# Patient Record
Sex: Female | Born: 1965 | Race: White | Hispanic: No | Marital: Married | Smoking: Former smoker
Health system: Southern US, Community
[De-identification: ages and names within clinical notes are randomized; demographics above are authoritative.]

## PROBLEM LIST (undated history)

## (undated) DIAGNOSIS — C8590 Non-Hodgkin lymphoma, unspecified, unspecified site: Secondary | ICD-10-CM

## (undated) DIAGNOSIS — Z8572 Personal history of non-Hodgkin lymphomas: Secondary | ICD-10-CM

## (undated) DIAGNOSIS — G4733 Obstructive sleep apnea (adult) (pediatric): Principal | ICD-10-CM

## (undated) DIAGNOSIS — J986 Disorders of diaphragm: Secondary | ICD-10-CM

## (undated) DIAGNOSIS — R0600 Dyspnea, unspecified: Secondary | ICD-10-CM

## (undated) DIAGNOSIS — I509 Heart failure, unspecified: Secondary | ICD-10-CM

## (undated) DIAGNOSIS — G4730 Sleep apnea, unspecified: Secondary | ICD-10-CM

## (undated) DIAGNOSIS — Z9481 Bone marrow transplant status: Secondary | ICD-10-CM

## (undated) DIAGNOSIS — E079 Disorder of thyroid, unspecified: Secondary | ICD-10-CM

## (undated) DIAGNOSIS — R011 Cardiac murmur, unspecified: Secondary | ICD-10-CM

## (undated) DIAGNOSIS — J45909 Unspecified asthma, uncomplicated: Secondary | ICD-10-CM

## (undated) DIAGNOSIS — E039 Hypothyroidism, unspecified: Secondary | ICD-10-CM

## (undated) HISTORY — DX: Personal history of non-Hodgkin lymphomas: Z85.72

## (undated) HISTORY — DX: Obstructive sleep apnea (adult) (pediatric): G47.33

## (undated) HISTORY — DX: Non-Hodgkin lymphoma, unspecified, unspecified site: C85.90

## (undated) HISTORY — DX: Heart failure, unspecified: I50.9

## (undated) HISTORY — DX: Cardiac murmur, unspecified: R01.1

## (undated) HISTORY — DX: Disorder of thyroid, unspecified: E07.9

## (undated) HISTORY — PX: TUBAL LIGATION: SHX77

## (undated) HISTORY — PX: LYMPH NODE BIOPSY: SHX201

## (undated) HISTORY — PX: BONE MARROW TRANSPLANT: SHX200

## (undated) HISTORY — DX: Sleep apnea, unspecified: G47.30

## (undated) HISTORY — DX: Disorders of diaphragm: J98.6

---

## 1998-05-27 DIAGNOSIS — C8590 Non-Hodgkin lymphoma, unspecified, unspecified site: Secondary | ICD-10-CM

## 1998-05-27 HISTORY — DX: Non-Hodgkin lymphoma, unspecified, unspecified site: C85.90

## 1999-05-28 DIAGNOSIS — Z9481 Bone marrow transplant status: Secondary | ICD-10-CM

## 1999-05-28 HISTORY — DX: Bone marrow transplant status: Z94.81

## 2012-05-27 HISTORY — PX: HYSTEROTOMY: SHX1776

## 2012-10-14 ENCOUNTER — Ambulatory Visit (INDEPENDENT_AMBULATORY_CARE_PROVIDER_SITE_OTHER): Payer: BC Managed Care – PPO | Admitting: Family Medicine

## 2012-10-14 ENCOUNTER — Encounter: Payer: Self-pay | Admitting: Family Medicine

## 2012-10-14 VITALS — BP 105/70 | HR 80 | Ht 65.0 in | Wt 200.0 lb

## 2012-10-14 DIAGNOSIS — L290 Pruritus ani: Secondary | ICD-10-CM

## 2012-10-14 DIAGNOSIS — E039 Hypothyroidism, unspecified: Secondary | ICD-10-CM

## 2012-10-14 DIAGNOSIS — N898 Other specified noninflammatory disorders of vagina: Secondary | ICD-10-CM

## 2012-10-14 MED ORDER — MEBENDAZOLE 100 MG PO CHEW: 100.0000 mg | CHEWABLE_TABLET | Freq: Once | ORAL | Status: AC

## 2012-10-14 MED ORDER — PRAMOXINE-HC 1-1 % EX CREA: TOPICAL_CREAM | Freq: Three times a day (TID) | CUTANEOUS | Status: DC

## 2012-10-14 MED ORDER — SYNTHROID 137 MCG PO TABS: 137.0000 ug | ORAL_TABLET | Freq: Every day | ORAL | Status: DC

## 2012-10-14 NOTE — Patient Instructions (Addendum)
1)  Anal Itching - Take the Vermox twice for treatment; Analpram for itcing; Flander's Buttock Ointment   Anal Itching Itching around the anus is a common problem. It is usually not dangerous. It often is caused by skin irritation from stool, moisture, soaps, or clothing. Other causes are pinworms, especially if the itching is worse at night. In adults, the itching may be due to hemorrhoids. In some cases, the cause is unknown. Itching usually can be controlled by keeping the anal area clean and dry. CAUSES   Loose or sticky stool from diarrhea or rectal leakage.   Hemorrhoids. They allow stool to stick to the rectal area.   Certain foods. Be sure to discuss your diet with your caregiver.   Dry skin or skin diseases.   Infections such as a local yeast infection or certain sexually transmitted diseases (STDs).   Worms (parasites).   Diseases of the anus. These include abscesses, fissures, fistulas or cancer.   Sometimes a cause cannot be found.  DIAGNOSIS   Your caregiver will take your history and examine you. A careful exam of the anus is important. Your caregiver will inspect the outer area of your anus and will do a rectal exam.   Sometimes your caregiver will need to look inside the anus. This is a simple procedure that may be a little uncomfortable but usually does not require anesthesia.   If abnormalities are found, a biopsy might be done or you may be referred to a specialist.  TREATMENT  The treatment of your condition will depend on the cause.   Your caregiver will advise you on treatment of any disease found.   If you have rectal leakage or loose stools, a diet high in fiber or a fiber supplement should improve your condition.   Avoid foods or substances that might be causing your itching.   Gentle care of your anal area is important to avoid worsening the irritation.  HOME CARE INSTRUCTIONS  Do not rub or scratch the area. This makes the itching worse. It could  worsen conditions such as parasite infections.   After every bowel movement and at bedtime, gently clean the anal area. Bathe or use moistened tissue or soft wash cloth. You also may use pre-moistened anal cleansing pads or tissues made for cleaning up babies. Do not use soap. Gently pat the area dry.   Wear underwear made of cotton or with a cotton crotch. Do not wear tight fitting clothes or underwear that keep moisture in.   Avoid foods and beverages that may cause anal itching. Examples are beer, tea, coffee, milk, cola, tomatoes, citrus fruits, nuts, chocolate, and spicy foods.   Be sure you have enough fiber in your diet.   Do not use products that may irritate the anal skin. These include perfumed or colored toilet paper, deodorant sprays, and perfumed soaps.   Do not use any medication on the anal area unless advised. Some products may make itching worse.   It may take a few weeks for things to fully improve.  SEEK MEDICAL CARE IF:   The itching is not better in 3 to 4 days or is getting worse.   The skin around the anus becomes red or tender. This may be a sign of infection.   You have pain in the anus, especially with bowel movement.  SEEK IMMEDIATE MEDICAL CARE IF:  You have increasing pain in the anus or in the abdomen.   You have blood coming from the  anus.   You have pus or other discharge from the anus.   You develop a fever.  Document Released: 05/10/2000 Document Revised: 05/02/2011 Document Reviewed: 05/17/2008 [facility] Patient Information 2012 [place], [place].

## 2012-10-14 NOTE — Progress Notes (Deleted)
  Subjective:    Patient ID: Ashley Mckee, female    DOB: [birth year], 47 y.o.   MRN: [id number]  HPI    Review of Systems     Objective:   Physical Exam        Assessment & Plan:

## 2012-10-14 NOTE — Progress Notes (Signed)
  Subjective:    Patient ID: Ashley Mckee, female    DOB: 17-Feb-1966, 47 y.o.   MRN: [id number]  HPI  Ashley Mckee is here today to establish care with our practice.  She previously received her medical care at [facility].  She would like to discuss a few issues:     1)  Anal Itching:  She has been having anal itching for about 6 months. She has been trying to keep this area cleaned and has used OTC creams which have not helped her itching.     2)  Vaginal Discharge:  She has recently had some brownish discharge. She has not had a period for several years.    3)  Hypothyroidism:  She needs to have her levothyroxine refilled.    Review of Systems  Constitutional: Negative for fatigue and unexpected weight change.  Gastrointestinal: Negative.   Genitourinary: Positive for vaginal discharge.  Skin:       Anal itching    Past Medical History  Diagnosis Date  . Thyroid disease   . Heart murmur   . CHF (congestive heart failure)     Last EF was >50%   . 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    . Cancer 2000    Non Hodgkin's Lymphoma (Bone Marrow Transplant)  - [facility]   Family History  Problem Relation Age of Onset  . Hypertension Mother   . Diabetes Father   . Hypertension Father   . Cancer Maternal Grandmother     Stomach ?      History   Social History Narrative   Marital Status:  Married Engineer, technical sales)   Children:  Daughter (3)    Pets: Dog (2) Cat (1)    Living Situation: Lives with husband and kids    Occupation: Youth worker)     Education: Geneticist, molecular    Tobacco Use/Exposure:  Former Smoker    Alcohol Use:  Rarely    Drug Use:  None   Diet:  Regular   Exercise:  None   Hobbies:  Walking                Objective:   Physical Exam  Constitutional: She appears well-nourished. No distress.  HENT:  Head: Normocephalic.  Eyes: No scleral icterus.  Neck: No thyromegaly  present.  Cardiovascular: Normal rate, regular rhythm and normal heart sounds.   Pulmonary/Chest: Effort normal and breath sounds normal.  Abdominal: There is no tenderness.  Genitourinary: No vaginal discharge found.  Musculoskeletal: She exhibits no edema and no tenderness.  Neurological: She is alert.  Skin: Skin is warm and dry. No rash noted. No erythema.  Anus appears normal   Psychiatric: She has a normal mood and affect. Her behavior is normal. Judgment and thought content normal.      Assessment & Plan:    TIME 30 MINUTES:  MORE THAN 50% OF TIME WAS INVOLVED IN COUNSELING

## 2012-10-28 ENCOUNTER — Encounter: Payer: Self-pay | Admitting: Family Medicine

## 2012-10-28 DIAGNOSIS — E039 Hypothyroidism, unspecified: Secondary | ICD-10-CM | POA: Insufficient documentation

## 2012-10-28 DIAGNOSIS — L290 Pruritus ani: Secondary | ICD-10-CM | POA: Insufficient documentation

## 2012-10-28 DIAGNOSIS — N898 Other specified noninflammatory disorders of vagina: Secondary | ICD-10-CM | POA: Insufficient documentation

## 2012-10-28 NOTE — Assessment & Plan Note (Signed)
Her anus appears to be normal.  We discussed several reasons why she might have itching. She was given several medications to help with her itching.

## 2012-10-28 NOTE — Assessment & Plan Note (Addendum)
Refilled her Synthroid 137 mcg.

## 2012-10-28 NOTE — Assessment & Plan Note (Signed)
Her vaginal exam was normal - no discharge was noted.

## 2012-11-17 ENCOUNTER — Ambulatory Visit (INDEPENDENT_AMBULATORY_CARE_PROVIDER_SITE_OTHER): Payer: BC Managed Care – PPO | Admitting: Family Medicine

## 2012-11-17 ENCOUNTER — Encounter: Payer: Self-pay | Admitting: Family Medicine

## 2012-11-17 VITALS — BP 119/77 | HR 77 | Temp 97.3°F | Resp 20

## 2012-11-17 DIAGNOSIS — R319 Hematuria, unspecified: Secondary | ICD-10-CM

## 2012-11-17 DIAGNOSIS — R399 Unspecified symptoms and signs involving the genitourinary system: Secondary | ICD-10-CM

## 2012-11-17 DIAGNOSIS — R3989 Other symptoms and signs involving the genitourinary system: Secondary | ICD-10-CM

## 2012-11-17 LAB — POCT URINALYSIS DIPSTICK
Bilirubin, UA: NEGATIVE
Glucose, UA: NEGATIVE
Ketones, UA: NEGATIVE
Nitrite, UA: NEGATIVE
Protein, UA: NEGATIVE
Spec Grav, UA: 1.02
Urobilinogen, UA: NEGATIVE
pH, UA: 7.5

## 2012-11-17 MED ORDER — PHENAZOPYRIDINE HCL 200 MG PO TABS: ORAL_TABLET | ORAL | Status: DC

## 2012-11-17 MED ORDER — SULFAMETHOXAZOLE-TRIMETHOPRIM 800-160 MG PO TABS: ORAL_TABLET | ORAL | Status: AC

## 2012-11-17 MED ORDER — TERCONAZOLE 0.8 % VA CREA: TOPICAL_CREAM | VAGINAL | Status: DC

## 2012-11-17 MED ORDER — FLUCONAZOLE 150 MG PO TABS: 150.0000 mg | ORAL_TABLET | Freq: Once | ORAL | Status: AC

## 2012-11-17 NOTE — Progress Notes (Signed)
  Subjective:    Patient ID: Ashley Mckee, female    DOB: 09-27-1965, 47 y.o.   MRN: [id number]  HPI  Ms Bosket is here today as an acute patient. She was in her normal state of good health until Sunday morning early when she began having painful urination with a lot of blood noted in her urine.  She denies any fever, flank pain or abdominal pain.  Her rectal itching is better from her last visit.  She never did get the prescription filled for pinworm.    Review of Systems  Genitourinary: Positive for dysuria, urgency, frequency and hematuria.   Past Medical History  Diagnosis Date  . Thyroid disease   . Heart murmur   . CHF (congestive heart failure)     Last EF was >50%   . 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    . Cancer 2000    Non Hodgkin's Lymphoma (Bone Marrow Transplant)  - [facility]   Family History  Problem Relation Age of Onset  . Hypertension Mother   . Diabetes Father   . Hypertension Father   . Cancer Maternal Grandmother     Stomach ?    History   Social History Narrative   Marital Status:  Married Engineer, technical sales)   Children:  Daughter (3)    Pets: Dog (2) Cat (1)    Living Situation: Lives with husband and kids    Occupation: Youth worker)     Education: Geneticist, molecular    Tobacco Use/Exposure:  Former Smoker    Alcohol Use:  Rarely    Drug Use:  None   Diet:  Regular   Exercise:  None   Hobbies:  Walking                Objective:   Physical Exam  Constitutional: She appears well-nourished. No distress.  Cardiovascular: Normal rate, regular rhythm and normal heart sounds.   Pulmonary/Chest: Effort normal and breath sounds normal.  Abdominal: She exhibits no mass. There is tenderness. There is no rebound, no guarding and no CVA tenderness.  Neurological: She is alert.  Skin: Skin is warm and dry. No rash noted.  Psychiatric: She has a normal mood and affect.        Assessment & Plan:

## 2012-11-17 NOTE — Patient Instructions (Addendum)
1)  UTI - Take the Septra for 7 days and the Pyridium for 3 days.  If you develop a yeast infection get the other prescriptions filled.  We are sending off a culture and will let you know the results.  If you don't grow anything then we'll have you bring in a first morning urine a week after you complete the antibiotic.     Hematuria, Adult Hematuria (blood in your urine) can be caused by a bladder infection (cystitis), kidney infection (pyelonephritis), prostate infection (prostatitis), or kidney stone. Infections will usually respond to antibiotics (medications which kill germs), and a kidney stone will usually pass through your urine without further treatment. If you were put on antibiotics, take all the medicine until gone. You may feel better in a few days, but take all of your medicine or the infection may not respond and become more difficult to treat. If antibiotics were not given, an infection did not cause the blood in the urine. A further work up to find out the reason may be needed. HOME CARE INSTRUCTIONS   Drink lots of fluid, 3 to 4 quarts a day. If you have been diagnosed with an infection, cranberry juice is especially recommended, in addition to large amounts of water.  Avoid caffeine, tea, and carbonated beverages, because they tend to irritate the bladder.  Avoid alcohol as it may irritate the prostate.  Only take over-the-counter or prescription medicines for pain, discomfort, or fever as directed by your caregiver.  If you have been diagnosed with a kidney stone follow your caregivers instructions regarding straining your urine to catch the stone. TO PREVENT FURTHER INFECTIONS:  Empty the bladder often. Avoid holding urine for long periods of time.  After a bowel movement, women should cleanse front to back. Use each tissue only once.  Empty the bladder before and after sexual intercourse if you are a female.  Return to your caregiver if you develop back pain, fever,  nausea (feeling sick to your stomach), vomiting, or your symptoms (problems) are not better in 3 days. Return sooner if you are getting worse. If you have been requested to return for further testing make sure to keep your appointments. If an infection is not the cause of blood in your urine, X-rays may be required. Your caregiver will discuss this with you. SEEK IMMEDIATE MEDICAL CARE IF:   You have a persistent fever over 102 F (38.9 C).  You develop severe vomiting and are unable to keep the medication down.  You develop severe back or abdominal pain despite taking your medications.  You begin passing a large amount of blood or clots in your urine.  You feel extremely weak or faint, or pass out. MAKE SURE YOU:   Understand these instructions.  Will watch your condition.  Will get help right away if you are not doing well or get worse. Document Released: 05/13/2005 Document Revised: 08/05/2011 Document Reviewed: 12/31/2007 [facility] Patient Information 2014 [place], [place].   Urinary Tract Infection Urinary tract infections (UTIs) can develop anywhere along your urinary tract. Your urinary tract is your body's drainage system for removing wastes and extra water. Your urinary tract includes two kidneys, two ureters, a bladder, and a urethra. Your kidneys are a pair of bean-shaped organs. Each kidney is about the size of your fist. They are located below your ribs, one on each side of your spine. CAUSES Infections are caused by microbes, which are microscopic organisms, including fungi, viruses, and bacteria. These organisms are  so small that they can only be seen through a microscope. Bacteria are the microbes that most commonly cause UTIs. SYMPTOMS  Symptoms of UTIs may vary by age and gender of the patient and by the location of the infection. Symptoms in young women typically include a frequent and intense urge to urinate and a painful, burning feeling in the bladder or urethra  during urination. Older women and men are more likely to be tired, shaky, and weak and have muscle aches and abdominal pain. A fever may mean the infection is in your kidneys. Other symptoms of a kidney infection include pain in your back or sides below the ribs, nausea, and vomiting. DIAGNOSIS To diagnose a UTI, your caregiver will ask you about your symptoms. Your caregiver also will ask to provide a urine sample. The urine sample will be tested for bacteria and white blood cells. White blood cells are made by your body to help fight infection. TREATMENT  Typically, UTIs can be treated with medication. Because most UTIs are caused by a bacterial infection, they usually can be treated with the use of antibiotics. The choice of antibiotic and length of treatment depend on your symptoms and the type of bacteria causing your infection. HOME CARE INSTRUCTIONS  If you were prescribed antibiotics, take them exactly as your caregiver instructs you. Finish the medication even if you feel better after you have only taken some of the medication.  Drink enough water and fluids to keep your urine clear or pale yellow.  Avoid caffeine, tea, and carbonated beverages. They tend to irritate your bladder.  Empty your bladder often. Avoid holding urine for long periods of time.  Empty your bladder before and after sexual intercourse.  After a bowel movement, women should cleanse from front to back. Use each tissue only once. SEEK MEDICAL CARE IF:   You have back pain.  You develop a fever.  Your symptoms do not begin to resolve within 3 days. SEEK IMMEDIATE MEDICAL CARE IF:   You have severe back pain or lower abdominal pain.  You develop chills.  You have nausea or vomiting.  You have continued burning or discomfort with urination. MAKE SURE YOU:   Understand these instructions.  Will watch your condition.  Will get help right away if you are not doing well or get worse. Document Released:  02/20/2005 Document Revised: 11/12/2011 Document Reviewed: 06/21/2011 [facility] Patient Information 2014 [place], [place].

## 2012-11-29 DIAGNOSIS — R399 Unspecified symptoms and signs involving the genitourinary system: Secondary | ICD-10-CM | POA: Insufficient documentation

## 2012-11-29 DIAGNOSIS — R319 Hematuria, unspecified: Secondary | ICD-10-CM | POA: Insufficient documentation

## 2012-11-29 NOTE — Assessment & Plan Note (Addendum)
Her urine appears to be infected so we';; treat her with Septra and Pyridium.   We'll send off a culture to confirm an infection and what is her best treatment.

## 2012-11-29 NOTE — Assessment & Plan Note (Signed)
If she does not grow anything in her urine, she is to bring in a first morning urine so we can check for blood.

## 2012-11-30 LAB — URINE CULTURE
Colony Count: NO GROWTH
Organism ID, Bacteria: NO GROWTH

## 2012-12-07 ENCOUNTER — Other Ambulatory Visit (INDEPENDENT_AMBULATORY_CARE_PROVIDER_SITE_OTHER): Payer: BC Managed Care – PPO | Admitting: *Deleted

## 2012-12-07 DIAGNOSIS — R319 Hematuria, unspecified: Secondary | ICD-10-CM

## 2012-12-07 LAB — POCT URINALYSIS DIPSTICK
Bilirubin, UA: NEGATIVE
Blood, UA: NEGATIVE
Glucose, UA: NEGATIVE
Ketones, UA: NEGATIVE
Leukocytes, UA: NEGATIVE
Nitrite, UA: NEGATIVE
Protein, UA: NEGATIVE
Spec Grav, UA: 1.01
Urobilinogen, UA: NEGATIVE
pH, UA: 5

## 2012-12-08 ENCOUNTER — Other Ambulatory Visit: Payer: BC Managed Care – PPO

## 2012-12-29 ENCOUNTER — Telehealth: Payer: Self-pay | Admitting: *Deleted

## 2012-12-31 ENCOUNTER — Other Ambulatory Visit: Payer: Self-pay | Admitting: Family Medicine

## 2012-12-31 DIAGNOSIS — I10 Essential (primary) hypertension: Secondary | ICD-10-CM

## 2012-12-31 DIAGNOSIS — Z5181 Encounter for therapeutic drug level monitoring: Secondary | ICD-10-CM

## 2012-12-31 DIAGNOSIS — Z13220 Encounter for screening for lipoid disorders: Secondary | ICD-10-CM

## 2012-12-31 DIAGNOSIS — E039 Hypothyroidism, unspecified: Secondary | ICD-10-CM

## 2012-12-31 MED ORDER — LISINOPRIL 10 MG PO TABS: 10.0000 mg | ORAL_TABLET | Freq: Every day | ORAL | Status: DC

## 2012-12-31 NOTE — Telephone Encounter (Signed)
error 

## 2015-01-18 ENCOUNTER — Telehealth: Payer: Self-pay | Admitting: Behavioral Health

## 2015-01-18 NOTE — Telephone Encounter (Signed)
Unable to reach patient at time of Pre-Visit Call.  Phone line was busy and could not leave a message.

## 2015-01-19 ENCOUNTER — Ambulatory Visit (INDEPENDENT_AMBULATORY_CARE_PROVIDER_SITE_OTHER): Payer: 59 | Admitting: Family Medicine

## 2015-01-19 ENCOUNTER — Encounter: Payer: Self-pay | Admitting: Family Medicine

## 2015-01-19 ENCOUNTER — Other Ambulatory Visit (HOSPITAL_COMMUNITY)
Admission: RE | Admit: 2015-01-19 | Discharge: 2015-01-19 | Disposition: A | Discharge status: Home / Self Care | Payer: 59 | Source: Ambulatory Visit | Attending: Family Medicine | Admitting: Family Medicine

## 2015-01-19 VITALS — BP 116/84 | HR 88 | Temp 98.3°F | Ht 65.5 in | Wt 225.2 lb

## 2015-01-19 DIAGNOSIS — N760 Acute vaginitis: Secondary | ICD-10-CM | POA: Diagnosis present

## 2015-01-19 DIAGNOSIS — Z0000 Encounter for general adult medical examination without abnormal findings: Secondary | ICD-10-CM

## 2015-01-19 DIAGNOSIS — K921 Melena: Secondary | ICD-10-CM

## 2015-01-19 DIAGNOSIS — Z113 Encounter for screening for infections with a predominantly sexual mode of transmission: Secondary | ICD-10-CM | POA: Diagnosis present

## 2015-01-19 DIAGNOSIS — Z01411 Encounter for gynecological examination (general) (routine) with abnormal findings: Secondary | ICD-10-CM | POA: Insufficient documentation

## 2015-01-19 DIAGNOSIS — K649 Unspecified hemorrhoids: Secondary | ICD-10-CM

## 2015-01-19 DIAGNOSIS — K625 Hemorrhage of anus and rectum: Secondary | ICD-10-CM | POA: Insufficient documentation

## 2015-01-19 DIAGNOSIS — Z124 Encounter for screening for malignant neoplasm of cervix: Secondary | ICD-10-CM

## 2015-01-19 DIAGNOSIS — L900 Lichen sclerosus et atrophicus: Secondary | ICD-10-CM | POA: Diagnosis not present

## 2015-01-19 DIAGNOSIS — I420 Dilated cardiomyopathy: Secondary | ICD-10-CM | POA: Insufficient documentation

## 2015-01-19 DIAGNOSIS — J986 Disorders of diaphragm: Secondary | ICD-10-CM | POA: Insufficient documentation

## 2015-01-19 DIAGNOSIS — O036 Delayed or excessive hemorrhage following complete or unspecified spontaneous abortion: Secondary | ICD-10-CM

## 2015-01-19 DIAGNOSIS — E039 Hypothyroidism, unspecified: Secondary | ICD-10-CM

## 2015-01-19 MED ORDER — PRAMOXINE HCL 1 % RE FOAM: 1.0000 "application " | Freq: Three times a day (TID) | RECTAL | Status: DC | PRN

## 2015-01-19 NOTE — Progress Notes (Signed)
Pre visit review using our clinic review tool, if applicable. No additional management support is needed unless otherwise documented below in the visit note. 

## 2015-01-19 NOTE — Patient Instructions (Signed)
Preventive Care for Adults A healthy lifestyle and preventive care can promote health and wellness. Preventive health guidelines for women include the following key practices.  A routine yearly physical is a good way to check with your health care provider about your health and preventive screening. It is a chance to share any concerns and updates on your health and to receive a thorough exam.  Visit your dentist for a routine exam and preventive care every 6 months. Brush your teeth twice a day and floss once a day. Good oral hygiene prevents tooth decay and gum disease.  The frequency of eye exams is based on your age, health, family medical history, use of contact lenses, and other factors. Follow your health care provider's recommendations for frequency of eye exams.  Eat a healthy diet. Foods like vegetables, fruits, whole grains, low-fat dairy products, and lean protein foods contain the nutrients you need without too many calories. Decrease your intake of foods high in solid fats, added sugars, and salt. Eat the right amount of calories for you.Get information about a proper diet from your health care provider, if necessary.  Regular physical exercise is one of the most important things you can do for your health. Most adults should get at least 150 minutes of moderate-intensity exercise (any activity that increases your heart rate and causes you to sweat) each week. In addition, most adults need muscle-strengthening exercises on 2 or more days a week.  Maintain a healthy weight. The body mass index (BMI) is a screening tool to identify possible weight problems. It provides an estimate of body fat based on height and weight. Your health care provider can find your BMI and can help you achieve or maintain a healthy weight.For adults 20 years and older:  A BMI below 18.5 is considered underweight.  A BMI of 18.5 to 24.9 is normal.  A BMI of 25 to 29.9 is considered overweight.  A BMI of  30 and above is considered obese.  Maintain normal blood lipids and cholesterol levels by exercising and minimizing your intake of saturated fat. Eat a balanced diet with plenty of fruit and vegetables. Blood tests for lipids and cholesterol should begin at age 76 and be repeated every 5 years. If your lipid or cholesterol levels are high, you are over 50, or you are at high risk for heart disease, you may need your cholesterol levels checked more frequently.Ongoing high lipid and cholesterol levels should be treated with medicines if diet and exercise are not working.  If you smoke, find out from your health care provider how to quit. If you do not use tobacco, do not start.  Lung cancer screening is recommended for adults aged 22-80 years who are at high risk for developing lung cancer because of a history of smoking. A yearly low-dose CT scan of the lungs is recommended for people who have at least a 30-pack-year history of smoking and are a current smoker or have quit within the past 15 years. A pack year of smoking is smoking an average of 1 pack of cigarettes a day for 1 year (for example: 1 pack a day for 30 years or 2 packs a day for 15 years). Yearly screening should continue until the smoker has stopped smoking for at least 15 years. Yearly screening should be stopped for people who develop a health problem that would prevent them from having lung cancer treatment.  If you are pregnant, do not drink alcohol. If you are breastfeeding,  be very cautious about drinking alcohol. If you are not pregnant and choose to drink alcohol, do not have more than 1 drink per day. One drink is considered to be 12 ounces (355 mL) of beer, 5 ounces (148 mL) of wine, or 1.5 ounces (44 mL) of liquor.  Avoid use of street drugs. Do not share needles with anyone. Ask for help if you need support or instructions about stopping the use of drugs.  High blood pressure causes heart disease and increases the risk of  stroke. Your blood pressure should be checked at least every 1 to 2 years. Ongoing high blood pressure should be treated with medicines if weight loss and exercise do not work.  If you are 75-52 years old, ask your health care provider if you should take aspirin to prevent strokes.  Diabetes screening involves taking a blood sample to check your fasting blood sugar level. This should be done once every 3 years, after age 15, if you are within normal weight and without risk factors for diabetes. Testing should be considered at a younger age or be carried out more frequently if you are overweight and have at least 1 risk factor for diabetes.  Breast cancer screening is essential preventive care for women. You should practice "breast self-awareness." This means understanding the normal appearance and feel of your breasts and may include breast self-examination. Any changes detected, no matter how small, should be reported to a health care provider. Women in their 58s and 30s should have a clinical breast exam (CBE) by a health care provider as part of a regular health exam every 1 to 3 years. After age 16, women should have a CBE every year. Starting at age 53, women should consider having a mammogram (breast X-ray test) every year. Women who have a family history of breast cancer should talk to their health care provider about genetic screening. Women at a high risk of breast cancer should talk to their health care providers about having an MRI and a mammogram every year.  Breast cancer gene (BRCA)-related cancer risk assessment is recommended for women who have family members with BRCA-related cancers. BRCA-related cancers include breast, ovarian, tubal, and peritoneal cancers. Having family members with these cancers may be associated with an increased risk for harmful changes (mutations) in the breast cancer genes BRCA1 and BRCA2. Results of the assessment will determine the need for genetic counseling and  BRCA1 and BRCA2 testing.  Routine pelvic exams to screen for cancer are no longer recommended for nonpregnant women who are considered low risk for cancer of the pelvic organs (ovaries, uterus, and vagina) and who do not have symptoms. Ask your health care provider if a screening pelvic exam is right for you.  If you have had past treatment for cervical cancer or a condition that could lead to cancer, you need Pap tests and screening for cancer for at least 20 years after your treatment. If Pap tests have been discontinued, your risk factors (such as having a new sexual partner) need to be reassessed to determine if screening should be resumed. Some women have medical problems that increase the chance of getting cervical cancer. In these cases, your health care provider may recommend more frequent screening and Pap tests.  The HPV test is an additional test that may be used for cervical cancer screening. The HPV test looks for the virus that can cause the cell changes on the cervix. The cells collected during the Pap test can be  tested for HPV. The HPV test could be used to screen women aged 30 years and older, and should be used in women of any age who have unclear Pap test results. After the age of 30, women should have HPV testing at the same frequency as a Pap test.  Colorectal cancer can be detected and often prevented. Most routine colorectal cancer screening begins at the age of 50 years and continues through age 75 years. However, your health care provider may recommend screening at an earlier age if you have risk factors for colon cancer. On a yearly basis, your health care provider may provide home test kits to check for hidden blood in the stool. Use of a small camera at the end of a tube, to directly examine the colon (sigmoidoscopy or colonoscopy), can detect the earliest forms of colorectal cancer. Talk to your health care provider about this at age 50, when routine screening begins. Direct  exam of the colon should be repeated every 5-10 years through age 75 years, unless early forms of pre-cancerous polyps or small growths are found.  People who are at an increased risk for hepatitis B should be screened for this virus. You are considered at high risk for hepatitis B if:  You were born in a country where hepatitis B occurs often. Talk with your health care provider about which countries are considered high risk.  Your parents were born in a high-risk country and you have not received a shot to protect against hepatitis B (hepatitis B vaccine).  You have HIV or AIDS.  You use needles to inject street drugs.  You live with, or have sex with, someone who has hepatitis B.  You get hemodialysis treatment.  You take certain medicines for conditions like cancer, organ transplantation, and autoimmune conditions.  Hepatitis C blood testing is recommended for all people born from 1945 through 1965 and any individual with known risks for hepatitis C.  Practice safe sex. Use condoms and avoid high-risk sexual practices to reduce the spread of sexually transmitted infections (STIs). STIs include gonorrhea, chlamydia, syphilis, trichomonas, herpes, HPV, and human immunodeficiency virus (HIV). Herpes, HIV, and HPV are viral illnesses that have no cure. They can result in disability, cancer, and death.  You should be screened for sexually transmitted illnesses (STIs) including gonorrhea and chlamydia if:  You are sexually active and are younger than 24 years.  You are older than 24 years and your health care provider tells you that you are at risk for this type of infection.  Your sexual activity has changed since you were last screened and you are at an increased risk for chlamydia or gonorrhea. Ask your health care provider if you are at risk.  If you are at risk of being infected with HIV, it is recommended that you take a prescription medicine daily to prevent HIV infection. This is  called preexposure prophylaxis (PrEP). You are considered at risk if:  You are a heterosexual woman, are sexually active, and are at increased risk for HIV infection.  You take drugs by injection.  You are sexually active with a partner who has HIV.  Talk with your health care provider about whether you are at high risk of being infected with HIV. If you choose to begin PrEP, you should first be tested for HIV. You should then be tested every 3 months for as long as you are taking PrEP.  Osteoporosis is a disease in which the bones lose minerals and strength   with aging. This can result in serious bone fractures or breaks. The risk of osteoporosis can be identified using a bone density scan. Women ages 65 years and over and women at risk for fractures or osteoporosis should discuss screening with their health care providers. Ask your health care provider whether you should take a calcium supplement or vitamin D to reduce the rate of osteoporosis.  Menopause can be associated with physical symptoms and risks. Hormone replacement therapy is available to decrease symptoms and risks. You should talk to your health care provider about whether hormone replacement therapy is right for you.  Use sunscreen. Apply sunscreen liberally and repeatedly throughout the day. You should seek shade when your shadow is shorter than you. Protect yourself by wearing long sleeves, pants, a wide-brimmed hat, and sunglasses year round, whenever you are outdoors.  Once a month, do a whole body skin exam, using a mirror to look at the skin on your back. Tell your health care provider of new moles, moles that have irregular borders, moles that are larger than a pencil eraser, or moles that have changed in shape or color.  Stay current with required vaccines (immunizations).  Influenza vaccine. All adults should be immunized every year.  Tetanus, diphtheria, and acellular pertussis (Td, Tdap) vaccine. Pregnant women should  receive 1 dose of Tdap vaccine during each pregnancy. The dose should be obtained regardless of the length of time since the last dose. Immunization is preferred during the 27th-36th week of gestation. An adult who has not previously received Tdap or who does not know her vaccine status should receive 1 dose of Tdap. This initial dose should be followed by tetanus and diphtheria toxoids (Td) booster doses every 10 years. Adults with an unknown or incomplete history of completing a 3-dose immunization series with Td-containing vaccines should begin or complete a primary immunization series including a Tdap dose. Adults should receive a Td booster every 10 years.  Varicella vaccine. An adult without evidence of immunity to varicella should receive 2 doses or a second dose if she has previously received 1 dose. Pregnant females who do not have evidence of immunity should receive the first dose after pregnancy. This first dose should be obtained before leaving the health care facility. The second dose should be obtained 4-8 weeks after the first dose.  Human papillomavirus (HPV) vaccine. Females aged 13-26 years who have not received the vaccine previously should obtain the 3-dose series. The vaccine is not recommended for use in pregnant females. However, pregnancy testing is not needed before receiving a dose. If a female is found to be pregnant after receiving a dose, no treatment is needed. In that case, the remaining doses should be delayed until after the pregnancy. Immunization is recommended for any person with an immunocompromised condition through the age of 26 years if she did not get any or all doses earlier. During the 3-dose series, the second dose should be obtained 4-8 weeks after the first dose. The third dose should be obtained 24 weeks after the first dose and 16 weeks after the second dose.  Zoster vaccine. One dose is recommended for adults aged 60 years or older unless certain conditions are  present.  Measles, mumps, and rubella (MMR) vaccine. Adults born before 1957 generally are considered immune to measles and mumps. Adults born in 1957 or later should have 1 or more doses of MMR vaccine unless there is a contraindication to the vaccine or there is laboratory evidence of immunity to   each of the three diseases. A routine second dose of MMR vaccine should be obtained at least 28 days after the first dose for students attending postsecondary schools, health care workers, or international travelers. People who received inactivated measles vaccine or an unknown type of measles vaccine during 1963-1967 should receive 2 doses of MMR vaccine. People who received inactivated mumps vaccine or an unknown type of mumps vaccine before 1979 and are at high risk for mumps infection should consider immunization with 2 doses of MMR vaccine. For females of childbearing age, rubella immunity should be determined. If there is no evidence of immunity, females who are not pregnant should be vaccinated. If there is no evidence of immunity, females who are pregnant should delay immunization until after pregnancy. Unvaccinated health care workers born before 1957 who lack laboratory evidence of measles, mumps, or rubella immunity or laboratory confirmation of disease should consider measles and mumps immunization with 2 doses of MMR vaccine or rubella immunization with 1 dose of MMR vaccine.  Pneumococcal 13-valent conjugate (PCV13) vaccine. When indicated, a person who is uncertain of her immunization history and has no record of immunization should receive the PCV13 vaccine. An adult aged 19 years or older who has certain medical conditions and has not been previously immunized should receive 1 dose of PCV13 vaccine. This PCV13 should be followed with a dose of pneumococcal polysaccharide (PPSV23) vaccine. The PPSV23 vaccine dose should be obtained at least 8 weeks after the dose of PCV13 vaccine. An adult aged 19  years or older who has certain medical conditions and previously received 1 or more doses of PPSV23 vaccine should receive 1 dose of PCV13. The PCV13 vaccine dose should be obtained 1 or more years after the last PPSV23 vaccine dose.  Pneumococcal polysaccharide (PPSV23) vaccine. When PCV13 is also indicated, PCV13 should be obtained first. All adults aged 65 years and older should be immunized. An adult younger than age 65 years who has certain medical conditions should be immunized. Any person who resides in a nursing home or long-term care facility should be immunized. An adult smoker should be immunized. People with an immunocompromised condition and certain other conditions should receive both PCV13 and PPSV23 vaccines. People with human immunodeficiency virus (HIV) infection should be immunized as soon as possible after diagnosis. Immunization during chemotherapy or radiation therapy should be avoided. Routine use of PPSV23 vaccine is not recommended for American Indians, [place] Natives, or people younger than 65 years unless there are medical conditions that require PPSV23 vaccine. When indicated, people who have unknown immunization and have no record of immunization should receive PPSV23 vaccine. One-time revaccination 5 years after the first dose of PPSV23 is recommended for people aged 19-64 years who have chronic kidney failure, nephrotic syndrome, asplenia, or immunocompromised conditions. People who received 1-2 doses of PPSV23 before age 65 years should receive another dose of PPSV23 vaccine at age 65 years or later if at least 5 years have passed since the previous dose. Doses of PPSV23 are not needed for people immunized with PPSV23 at or after age 65 years.  Meningococcal vaccine. Adults with asplenia or persistent complement component deficiencies should receive 2 doses of quadrivalent meningococcal conjugate (MenACWY-D) vaccine. The doses should be obtained at least 2 months apart.  Microbiologists working with certain meningococcal bacteria, military recruits, people at risk during an outbreak, and people who travel to or live in countries with a high rate of meningitis should be immunized. A first-year college student up through age   21 years who is living in a residence hall should receive a dose if she did not receive a dose on or after her 16th birthday. Adults who have certain high-risk conditions should receive one or more doses of vaccine.  Hepatitis A vaccine. Adults who wish to be protected from this disease, have certain high-risk conditions, work with hepatitis A-infected animals, work in hepatitis A research labs, or travel to or work in countries with a high rate of hepatitis A should be immunized. Adults who were previously unvaccinated and who anticipate close contact with an international adoptee during the first 60 days after arrival in the [place] from a country with a high rate of hepatitis A should be immunized.  Hepatitis B vaccine. Adults who wish to be protected from this disease, have certain high-risk conditions, may be exposed to blood or other infectious body fluids, are household contacts or sex partners of hepatitis B positive people, are clients or workers in certain care facilities, or travel to or work in countries with a high rate of hepatitis B should be immunized.  Haemophilus influenzae type b (Hib) vaccine. A previously unvaccinated person with asplenia or sickle cell disease or having a scheduled splenectomy should receive 1 dose of Hib vaccine. Regardless of previous immunization, a recipient of a hematopoietic stem cell transplant should receive a 3-dose series 6-12 months after her successful transplant. Hib vaccine is not recommended for adults with HIV infection. Preventive Services / Frequency Ages 64 to 68 years  Blood pressure check.** / Every 1 to 2 years.  Lipid and cholesterol check.** / Every 5 years beginning at age  22.  Clinical breast exam.** / Every 3 years for women in their 88s and 53s.  BRCA-related cancer risk assessment.** / For women who have family members with a BRCA-related cancer (breast, ovarian, tubal, or peritoneal cancers).  Pap test.** / Every 2 years from ages 90 through 51. Every 3 years starting at age 21 through age 56 or 3 with a history of 3 consecutive normal Pap tests.  HPV screening.** / Every 3 years from ages 24 through ages 1 to 46 with a history of 3 consecutive normal Pap tests.  Hepatitis C blood test.** / For any individual with known risks for hepatitis C.  Skin self-exam. / Monthly.  Influenza vaccine. / Every year.  Tetanus, diphtheria, and acellular pertussis (Tdap, Td) vaccine.** / Consult your health care provider. Pregnant women should receive 1 dose of Tdap vaccine during each pregnancy. 1 dose of Td every 10 years.  Varicella vaccine.** / Consult your health care provider. Pregnant females who do not have evidence of immunity should receive the first dose after pregnancy.  HPV vaccine. / 3 doses over 6 months, if 72 and younger. The vaccine is not recommended for use in pregnant females. However, pregnancy testing is not needed before receiving a dose.  Measles, mumps, rubella (MMR) vaccine.** / You need at least 1 dose of MMR if you were born in 1957 or later. You may also need a 2nd dose. For females of childbearing age, rubella immunity should be determined. If there is no evidence of immunity, females who are not pregnant should be vaccinated. If there is no evidence of immunity, females who are pregnant should delay immunization until after pregnancy.  Pneumococcal 13-valent conjugate (PCV13) vaccine.** / Consult your health care provider.  Pneumococcal polysaccharide (PPSV23) vaccine.** / 1 to 2 doses if you smoke cigarettes or if you have certain conditions.  Meningococcal vaccine.** /  1 dose if you are age 19 to 21 years and a first-year college  student living in a residence hall, or have one of several medical conditions, you need to get vaccinated against meningococcal disease. You may also need additional booster doses.  Hepatitis A vaccine.** / Consult your health care provider.  Hepatitis B vaccine.** / Consult your health care provider.  Haemophilus influenzae type b (Hib) vaccine.** / Consult your health care provider. Ages 40 to 64 years  Blood pressure check.** / Every 1 to 2 years.  Lipid and cholesterol check.** / Every 5 years beginning at age 20 years.  Lung cancer screening. / Every year if you are aged 55-80 years and have a 30-pack-year history of smoking and currently smoke or have quit within the past 15 years. Yearly screening is stopped once you have quit smoking for at least 15 years or develop a health problem that would prevent you from having lung cancer treatment.  Clinical breast exam.** / Every year after age 40 years.  BRCA-related cancer risk assessment.** / For women who have family members with a BRCA-related cancer (breast, ovarian, tubal, or peritoneal cancers).  Mammogram.** / Every year beginning at age 40 years and continuing for as long as you are in good health. Consult with your health care provider.  Pap test.** / Every 3 years starting at age 30 years through age 65 or 70 years with a history of 3 consecutive normal Pap tests.  HPV screening.** / Every 3 years from ages 30 years through ages 65 to 70 years with a history of 3 consecutive normal Pap tests.  Fecal occult blood test (FOBT) of stool. / Every year beginning at age 50 years and continuing until age 75 years. You may not need to do this test if you get a colonoscopy every 10 years.  Flexible sigmoidoscopy or colonoscopy.** / Every 5 years for a flexible sigmoidoscopy or every 10 years for a colonoscopy beginning at age 50 years and continuing until age 75 years.  Hepatitis C blood test.** / For all people born from 1945 through  1965 and any individual with known risks for hepatitis C.  Skin self-exam. / Monthly.  Influenza vaccine. / Every year.  Tetanus, diphtheria, and acellular pertussis (Tdap/Td) vaccine.** / Consult your health care provider. Pregnant women should receive 1 dose of Tdap vaccine during each pregnancy. 1 dose of Td every 10 years.  Varicella vaccine.** / Consult your health care provider. Pregnant females who do not have evidence of immunity should receive the first dose after pregnancy.  Zoster vaccine.** / 1 dose for adults aged 60 years or older.  Measles, mumps, rubella (MMR) vaccine.** / You need at least 1 dose of MMR if you were born in 1957 or later. You may also need a 2nd dose. For females of childbearing age, rubella immunity should be determined. If there is no evidence of immunity, females who are not pregnant should be vaccinated. If there is no evidence of immunity, females who are pregnant should delay immunization until after pregnancy.  Pneumococcal 13-valent conjugate (PCV13) vaccine.** / Consult your health care provider.  Pneumococcal polysaccharide (PPSV23) vaccine.** / 1 to 2 doses if you smoke cigarettes or if you have certain conditions.  Meningococcal vaccine.** / Consult your health care provider.  Hepatitis A vaccine.** / Consult your health care provider.  Hepatitis B vaccine.** / Consult your health care provider.  Haemophilus influenzae type b (Hib) vaccine.** / Consult your health care provider. Ages 65   years and over  Blood pressure check.** / Every 1 to 2 years.  Lipid and cholesterol check.** / Every 5 years beginning at age 22 years.  Lung cancer screening. / Every year if you are aged 73-80 years and have a 30-pack-year history of smoking and currently smoke or have quit within the past 15 years. Yearly screening is stopped once you have quit smoking for at least 15 years or develop a health problem that would prevent you from having lung cancer  treatment.  Clinical breast exam.** / Every year after age 4 years.  BRCA-related cancer risk assessment.** / For women who have family members with a BRCA-related cancer (breast, ovarian, tubal, or peritoneal cancers).  Mammogram.** / Every year beginning at age 40 years and continuing for as long as you are in good health. Consult with your health care provider.  Pap test.** / Every 3 years starting at age 9 years through age 34 or 91 years with 3 consecutive normal Pap tests. Testing can be stopped between 65 and 70 years with 3 consecutive normal Pap tests and no abnormal Pap or HPV tests in the past 10 years.  HPV screening.** / Every 3 years from ages 57 years through ages 64 or 45 years with a history of 3 consecutive normal Pap tests. Testing can be stopped between 65 and 70 years with 3 consecutive normal Pap tests and no abnormal Pap or HPV tests in the past 10 years.  Fecal occult blood test (FOBT) of stool. / Every year beginning at age 15 years and continuing until age 17 years. You may not need to do this test if you get a colonoscopy every 10 years.  Flexible sigmoidoscopy or colonoscopy.** / Every 5 years for a flexible sigmoidoscopy or every 10 years for a colonoscopy beginning at age 86 years and continuing until age 71 years.  Hepatitis C blood test.** / For all people born from [birth year] through 1963 and any individual with known risks for hepatitis C.  Osteoporosis screening.** / A one-time screening for women ages 83 years and over and women at risk for fractures or osteoporosis.  Skin self-exam. / Monthly.  Influenza vaccine. / Every year.  Tetanus, diphtheria, and acellular pertussis (Tdap/Td) vaccine.** / 1 dose of Td every 10 years.  Varicella vaccine.** / Consult your health care provider.  Zoster vaccine.** / 1 dose for adults aged 61 years or older.  Pneumococcal 13-valent conjugate (PCV13) vaccine.** / Consult your health care provider.  Pneumococcal  polysaccharide (PPSV23) vaccine.** / 1 dose for all adults aged 28 years and older.  Meningococcal vaccine.** / Consult your health care provider.  Hepatitis A vaccine.** / Consult your health care provider.  Hepatitis B vaccine.** / Consult your health care provider.  Haemophilus influenzae type b (Hib) vaccine.** / Consult your health care provider. ** Family history and personal history of risk and conditions may change your health care provider's recommendations. Document Released: 07/09/2001 Document Revised: 09/27/2013 Document Reviewed: 10/08/2010 [facility] Patient Information 2015 [place], [place]. This information is not intended to replace advice given to you by your health care provider. Make sure you discuss any questions you have with your health care provider.

## 2015-01-20 ENCOUNTER — Encounter: Payer: Self-pay | Admitting: Gastroenterology

## 2015-01-20 ENCOUNTER — Telehealth: Payer: Self-pay | Admitting: Family Medicine

## 2015-01-20 DIAGNOSIS — I10 Essential (primary) hypertension: Secondary | ICD-10-CM

## 2015-01-20 DIAGNOSIS — E039 Hypothyroidism, unspecified: Secondary | ICD-10-CM

## 2015-01-20 LAB — CBC WITH DIFFERENTIAL/PLATELET
Basophils Absolute: 0.1 10*3/uL (ref 0.0–0.1)
Basophils Relative: 1 % (ref 0.0–3.0)
EOS PCT: 2.3 % (ref 0.0–5.0)
Eosinophils Absolute: 0.2 10*3/uL (ref 0.0–0.7)
HCT: 41.9 % (ref 36.0–46.0)
Hemoglobin: 14.1 g/dL (ref 12.0–15.0)
LYMPHS ABS: 2.3 10*3/uL (ref 0.7–4.0)
Lymphocytes Relative: 30.7 % (ref 12.0–46.0)
MCHC: 33.6 g/dL (ref 30.0–36.0)
MCV: 91.8 fl (ref 78.0–100.0)
MONOS PCT: 10.7 % (ref 3.0–12.0)
Monocytes Absolute: 0.8 10*3/uL (ref 0.1–1.0)
NEUTROS ABS: 4.1 10*3/uL (ref 1.4–7.7)
NEUTROS PCT: 55.3 % (ref 43.0–77.0)
PLATELETS: 247 10*3/uL (ref 150.0–400.0)
RBC: 4.56 Mil/uL (ref 3.87–5.11)
RDW: 13.4 % (ref 11.5–15.5)
WBC: 7.5 10*3/uL (ref 4.0–10.5)

## 2015-01-20 LAB — COMPREHENSIVE METABOLIC PANEL
ALK PHOS: 75 U/L (ref 39–117)
ALT: 23 U/L (ref 0–35)
AST: 19 U/L (ref 0–37)
Albumin: 4.2 g/dL (ref 3.5–5.2)
BUN: 13 mg/dL (ref 6–23)
CO2: 31 meq/L (ref 19–32)
Calcium: 9.8 mg/dL (ref 8.4–10.5)
Chloride: 100 mEq/L (ref 96–112)
Creatinine, Ser: 0.82 mg/dL (ref 0.40–1.20)
GFR: 79.3 mL/min (ref >60.00)
GLUCOSE: 76 mg/dL (ref 70–99)
POTASSIUM: 4 meq/L (ref 3.5–5.1)
Sodium: 138 mEq/L (ref 135–145)
Total Bilirubin: 0.6 mg/dL (ref 0.2–1.2)
Total Protein: 7.5 g/dL (ref 6.0–8.3)

## 2015-01-20 LAB — LIPID PANEL
CHOL/HDL RATIO: 5
Cholesterol: 223 mg/dL — ABNORMAL HIGH (ref 0–200)
HDL: 44.4 mg/dL (ref >39.00)
LDL Cholesterol: 152 mg/dL — ABNORMAL HIGH (ref 0–99)
NONHDL: 179.07
Triglycerides: 133 mg/dL (ref 0.0–149.0)
VLDL: 26.6 mg/dL (ref 0.0–40.0)

## 2015-01-20 LAB — TSH: TSH: 0.37 u[IU]/mL (ref 0.35–4.50)

## 2015-01-20 MED ORDER — LISINOPRIL 10 MG PO TABS: 10.0000 mg | ORAL_TABLET | Freq: Every day | ORAL | Status: DC

## 2015-01-20 MED ORDER — SYNTHROID 137 MCG PO TABS: 137.0000 ug | ORAL_TABLET | Freq: Every day | ORAL | Status: DC

## 2015-01-20 NOTE — Telephone Encounter (Signed)
°  Relation to TA:VWPV Call back number:[phone] [facility]acy[facility]  Reason for call: pt was seen yesterday states the pharmacy did not get the rx for her linsinipril and synthroid, requesting 90 day for a year. States you informed her that you would call it in for her.

## 2015-01-20 NOTE — Telephone Encounter (Signed)
Med's faxed since PCP did not send in yesterday.    KP

## 2015-01-23 LAB — CYTOLOGY - PAP

## 2015-01-23 NOTE — Progress Notes (Signed)
Patient ID: Ashley Mckee, female    DOB: January 08, 1966  Age: 49 y.o. MRN: [id number]    Subjective:  Subjective HPI Ashley Mckee presents to establish.  She c/o of clitoral pain and hemorrhoids.  She has tried otc with no relief.    Review of Systems  Constitutional: Negative for diaphoresis, appetite change, fatigue and unexpected weight change.  Eyes: Negative for pain, redness and visual disturbance.  Respiratory: Negative for cough, chest tightness, shortness of breath and wheezing.   Cardiovascular: Negative for chest pain, palpitations and leg swelling.  Endocrine: Negative for cold intolerance, heat intolerance, polydipsia, polyphagia and polyuria.  Genitourinary: Negative for dysuria, frequency and difficulty urinating.  Neurological: Negative for dizziness, light-headedness, numbness and headaches.    History Past Medical History  Diagnosis Date  . Thyroid disease   . Heart murmur   . CHF (congestive heart failure)     Last EF was >50%   . 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    . Non Hodgkin's lymphoma 2000    Non Hodgkin's Lymphoma (Bone Marrow Transplant)  - [place]    She has past surgical history that includes Tubal ligation; Lymph node biopsy; and Bone marrow transplant.   Her family history includes Diabetes in her father; Hypertension in her father and mother; Stomach cancer in her maternal grandmother.She reports that she quit smoking about 15 years ago. Her smoking use included Cigarettes. She has a 25.5 pack-year smoking history. She does not have any smokeless tobacco history on file. She reports that she drinks alcohol. She reports that she does not use illicit drugs.  No current outpatient prescriptions on file prior to visit.   No current facility-administered medications on file prior to visit.     Objective:  Objective Physical Exam  Constitutional: She is oriented to person,  place, and time. She appears well-developed and well-nourished. No distress.  HENT:  Head: Normocephalic and atraumatic.  Right Ear: External ear normal.  Left Ear: External ear normal.  Nose: Nose normal.  Mouth/Throat: Oropharynx is clear and moist.  Eyes: Conjunctivae and EOM are normal. Pupils are equal, round, and reactive to light.  Neck: Normal range of motion. Neck supple. No JVD present. Carotid bruit is not present. No thyromegaly present.  Cardiovascular: Normal rate, regular rhythm and normal heart sounds.   No murmur heard. Pulmonary/Chest: Effort normal and breath sounds normal. No respiratory distress. She has no wheezes. She has no rales. She exhibits no tenderness.  Abdominal: Hernia confirmed negative in the right inguinal area and confirmed negative in the left inguinal area.  Genitourinary: Vagina normal and uterus normal. Guaiac negative stool. No breast swelling, tenderness, discharge or bleeding. Pelvic exam was performed with patient supine. There is lesion on the right labia. There is no rash, tenderness or injury on the right labia. There is lesion on the left labia. There is no rash, tenderness or injury on the left labia. No vaginal discharge found.  + lichen sclerosis ext vulval and perirectal  Musculoskeletal: She exhibits no edema.  Neurological: She is alert and oriented to person, place, and time.  Psychiatric: She has a normal mood and affect. Her behavior is normal. Judgment and thought content normal.   BP 116/84 mmHg  Pulse 88  Temp(Src) 98.3 F (36.8 C) (Oral)  Ht 5' 5.5" (1.664 m)  Wt 225 lb 3.2 oz (102.15 kg)  BMI 36.89 kg/m2  SpO2 97% Wt  Readings from Last 3 Encounters:  01/19/15 225 lb 3.2 oz (102.15 kg)  10/14/12 200 lb (90.719 kg)     Lab Results  Component Value Date   WBC 7.5 01/19/2015   HGB 14.1 01/19/2015   HCT 41.9 01/19/2015   PLT 247.0 01/19/2015   GLUCOSE 76 01/19/2015   CHOL 223* 01/19/2015   TRIG 133.0 01/19/2015   HDL  44.40 01/19/2015   LDLCALC 152* 01/19/2015   ALT 23 01/19/2015   AST 19 01/19/2015   NA 138 01/19/2015   K 4.0 01/19/2015   CL 100 01/19/2015   CREATININE 0.82 01/19/2015   BUN 13 01/19/2015   CO2 31 01/19/2015   TSH 0.37 01/19/2015    Patient was never admitted.   Assessment & Plan:  Plan I have discontinued Ms. Mcglothen's ibuprofen, phenazopyridine, and terconazole. I am also having her start on pramoxine. Additionally, I am having her maintain her nystatin cream, clotrimazole-betamethasone, albuterol, hydrocortisone, and hydrocortisone-pramoxine.  Meds ordered this encounter  Medications  . nystatin cream (MYCOSTATIN)    Sig: Apply 1 application topically 2 (two) times daily.  . clotrimazole-betamethasone (LOTRISONE) cream    Sig: Apply 1 application topically 3 (three) times daily as needed.  Marland Kitchen albuterol (PROVENTIL HFA;VENTOLIN HFA) 108 (90 BASE) MCG/ACT inhaler    Sig: Inhale 1 puff into the lungs every 6 (six) hours as needed for wheezing or shortness of breath.  . hydrocortisone (ANUSOL-HC) 2.5 % rectal cream    Sig: Place 1 application rectally 2 (two) times daily.  . hydrocortisone-pramoxine (PROCTOFOAM HC) rectal foam    Sig: Place 1 applicator rectally 2 (two) times daily.  . pramoxine (PROCTOFOAM) 1 % foam    Sig: Place 1 application rectally 3 (three) times daily as needed for itching.    Dispense:  15 g    Refill:  0    Problem List Items Addressed This Visit    Lichen sclerosus - Primary   Relevant Orders   Ambulatory referral to Gynecology   Diaphragm paralysis    Other Visit Diagnoses    Hypothyroidism, unspecified hypothyroidism type        Relevant Orders    TSH (Completed)    Preventative health care        Relevant Orders    CBC with Differential/Platelet (Completed)    Comprehensive metabolic panel (Completed)    Lipid panel (Completed)    POCT urinalysis dipstick    TSH (Completed)    Blood in stool        Relevant Orders    Ambulatory  referral to Gastroenterology    Abortion, spontaneous with hemorrhage        Hemorrhoids, unspecified hemorrhoid type        Relevant Orders    Ambulatory referral to Gastroenterology    Cervical cancer screening        Relevant Orders    Cytology - PAP       Follow-up: Return in about 6 months (around 07/22/2015).  Garnet Koyanagi, DO

## 2015-01-25 ENCOUNTER — Telehealth: Payer: Self-pay

## 2015-01-25 LAB — CERVICOVAGINAL ANCILLARY ONLY
BACTERIAL VAGINITIS: NEGATIVE
Candida vaginitis: NEGATIVE

## 2015-01-25 NOTE — Telephone Encounter (Signed)
-----   Message from Rosalita Chessman, DO sent at 01/24/2015 10:11 PM EDT ----- Normal pap Cholesterol--- LDL goal < 100,  HDL >40,  TG < 150.  Diet and exercise will increase HDL and decrease LDL and TG.  Fish,  Fish Oil, Flaxseed oil will also help increase the HDL and decrease Triglycerides.   Recheck labs in 3 months---- lipid, hep.

## 2015-01-25 NOTE — Telephone Encounter (Signed)
LMOVM

## 2015-01-26 ENCOUNTER — Telehealth: Payer: Self-pay | Admitting: Family Medicine

## 2015-01-26 NOTE — Telephone Encounter (Signed)
Relation to pt: self Call back [phone]   Reason for call:  Patient inquiring about lab results

## 2015-01-26 NOTE — Telephone Encounter (Signed)
See lab results.      KP 

## 2015-02-16 ENCOUNTER — Other Ambulatory Visit: Payer: Self-pay | Admitting: Obstetrics and Gynecology

## 2015-03-11 ENCOUNTER — Emergency Department (HOSPITAL_BASED_OUTPATIENT_CLINIC_OR_DEPARTMENT_OTHER): Payer: 59

## 2015-03-11 ENCOUNTER — Inpatient Hospital Stay (HOSPITAL_BASED_OUTPATIENT_CLINIC_OR_DEPARTMENT_OTHER)
Admission: EM | Admit: 2015-03-11 | Discharge: 2015-03-13 | DRG: 193 | Disposition: A | Discharge status: Home / Self Care | Payer: 59 | Attending: Internal Medicine | Admitting: Internal Medicine

## 2015-03-11 ENCOUNTER — Encounter (HOSPITAL_BASED_OUTPATIENT_CLINIC_OR_DEPARTMENT_OTHER): Payer: Self-pay

## 2015-03-11 DIAGNOSIS — R0602 Shortness of breath: Secondary | ICD-10-CM | POA: Diagnosis present

## 2015-03-11 DIAGNOSIS — J209 Acute bronchitis, unspecified: Secondary | ICD-10-CM | POA: Diagnosis present

## 2015-03-11 DIAGNOSIS — E039 Hypothyroidism, unspecified: Secondary | ICD-10-CM | POA: Diagnosis present

## 2015-03-11 DIAGNOSIS — R7989 Other specified abnormal findings of blood chemistry: Secondary | ICD-10-CM

## 2015-03-11 DIAGNOSIS — Z79899 Other long term (current) drug therapy: Secondary | ICD-10-CM

## 2015-03-11 DIAGNOSIS — E669 Obesity, unspecified: Secondary | ICD-10-CM | POA: Diagnosis present

## 2015-03-11 DIAGNOSIS — R0902 Hypoxemia: Secondary | ICD-10-CM | POA: Diagnosis not present

## 2015-03-11 DIAGNOSIS — Z800 Family history of malignant neoplasm of digestive organs: Secondary | ICD-10-CM | POA: Diagnosis not present

## 2015-03-11 DIAGNOSIS — Z6839 Body mass index (BMI) 39.0-39.9, adult: Secondary | ICD-10-CM | POA: Diagnosis not present

## 2015-03-11 DIAGNOSIS — Z8249 Family history of ischemic heart disease and other diseases of the circulatory system: Secondary | ICD-10-CM

## 2015-03-11 DIAGNOSIS — I871 Compression of vein: Secondary | ICD-10-CM | POA: Diagnosis present

## 2015-03-11 DIAGNOSIS — J9859 Other diseases of mediastinum, not elsewhere classified: Secondary | ICD-10-CM | POA: Diagnosis present

## 2015-03-11 DIAGNOSIS — Z8572 Personal history of non-Hodgkin lymphomas: Secondary | ICD-10-CM | POA: Diagnosis not present

## 2015-03-11 DIAGNOSIS — R739 Hyperglycemia, unspecified: Secondary | ICD-10-CM | POA: Diagnosis present

## 2015-03-11 DIAGNOSIS — T380X5A Adverse effect of glucocorticoids and synthetic analogues, initial encounter: Secondary | ICD-10-CM | POA: Diagnosis present

## 2015-03-11 DIAGNOSIS — J189 Pneumonia, unspecified organism: Principal | ICD-10-CM | POA: Diagnosis present

## 2015-03-11 DIAGNOSIS — C8590 Non-Hodgkin lymphoma, unspecified, unspecified site: Secondary | ICD-10-CM | POA: Diagnosis not present

## 2015-03-11 DIAGNOSIS — Z9481 Bone marrow transplant status: Secondary | ICD-10-CM

## 2015-03-11 DIAGNOSIS — Z9221 Personal history of antineoplastic chemotherapy: Secondary | ICD-10-CM

## 2015-03-11 DIAGNOSIS — J9601 Acute respiratory failure with hypoxia: Secondary | ICD-10-CM | POA: Diagnosis not present

## 2015-03-11 DIAGNOSIS — D72829 Elevated white blood cell count, unspecified: Secondary | ICD-10-CM | POA: Diagnosis present

## 2015-03-11 DIAGNOSIS — Z833 Family history of diabetes mellitus: Secondary | ICD-10-CM

## 2015-03-11 DIAGNOSIS — J986 Disorders of diaphragm: Secondary | ICD-10-CM | POA: Diagnosis not present

## 2015-03-11 DIAGNOSIS — Z87891 Personal history of nicotine dependence: Secondary | ICD-10-CM | POA: Diagnosis not present

## 2015-03-11 DIAGNOSIS — R740 Nonspecific elevation of levels of transaminase and lactic acid dehydrogenase [LDH]: Secondary | ICD-10-CM

## 2015-03-11 DIAGNOSIS — I5032 Chronic diastolic (congestive) heart failure: Secondary | ICD-10-CM | POA: Diagnosis present

## 2015-03-11 DIAGNOSIS — R7401 Elevation of levels of liver transaminase levels: Secondary | ICD-10-CM

## 2015-03-11 DIAGNOSIS — J449 Chronic obstructive pulmonary disease, unspecified: Secondary | ICD-10-CM | POA: Diagnosis not present

## 2015-03-11 DIAGNOSIS — I509 Heart failure, unspecified: Secondary | ICD-10-CM | POA: Diagnosis not present

## 2015-03-11 DIAGNOSIS — R6510 Systemic inflammatory response syndrome (SIRS) of non-infectious origin without acute organ dysfunction: Secondary | ICD-10-CM

## 2015-03-11 HISTORY — DX: Bone marrow transplant status: Z94.81

## 2015-03-11 LAB — URINALYSIS, ROUTINE W REFLEX MICROSCOPIC
BILIRUBIN URINE: NEGATIVE
GLUCOSE, UA: NEGATIVE mg/dL
Hgb urine dipstick: NEGATIVE
KETONES UR: NEGATIVE mg/dL
LEUKOCYTES UA: NEGATIVE
Nitrite: NEGATIVE
PROTEIN: NEGATIVE mg/dL
Specific Gravity, Urine: >1.046 — ABNORMAL HIGH (ref 1.005–1.030)
Urobilinogen, UA: 0.2 mg/dL (ref 0.0–1.0)
pH: 6.5 (ref 5.0–8.0)

## 2015-03-11 LAB — I-STAT ARTERIAL BLOOD GAS, ED
Acid-base deficit: 4 mmol/L — ABNORMAL HIGH (ref 0.0–2.0)
BICARBONATE: 22.2 meq/L (ref 20.0–24.0)
O2 Saturation: 90 %
PH ART: 7.312 — AB (ref 7.350–7.450)
PO2 ART: 65 mmHg — AB (ref 80.0–100.0)
Patient temperature: 98.8
TCO2: 24 mmol/L (ref 0–100)
pCO2 arterial: 44 mmHg (ref 35.0–45.0)

## 2015-03-11 LAB — COMPREHENSIVE METABOLIC PANEL
ALBUMIN: 4.2 g/dL (ref 3.5–5.0)
ALT: 69 U/L — AB (ref 14–54)
AST: 89 U/L — AB (ref 15–41)
Alkaline Phosphatase: 91 U/L (ref 38–126)
Anion gap: 5 (ref 5–15)
BUN: 11 mg/dL (ref 6–20)
CALCIUM: 9.3 mg/dL (ref 8.9–10.3)
CO2: 32 mmol/L (ref 22–32)
CREATININE: 0.85 mg/dL (ref 0.44–1.00)
Chloride: 102 mmol/L (ref 101–111)
GFR calc Af Amer: >60 mL/min (ref >60)
GFR calc non Af Amer: >60 mL/min (ref >60)
GLUCOSE: 142 mg/dL — AB (ref 65–99)
Potassium: 3.9 mmol/L (ref 3.5–5.1)
SODIUM: 139 mmol/L (ref 135–145)
Total Bilirubin: 0.9 mg/dL (ref 0.3–1.2)
Total Protein: 7.7 g/dL (ref 6.5–8.1)

## 2015-03-11 LAB — I-STAT CG4 LACTIC ACID, ED
Lactic Acid, Venous: 1.94 mmol/L (ref 0.5–2.0)
Lactic Acid, Venous: 2.96 mmol/L (ref 0.5–2.0)
Lactic Acid, Venous: 2.88 mmol/L (ref 0.5–2.0)

## 2015-03-11 LAB — CBC WITH DIFFERENTIAL/PLATELET
BASOS ABS: 0 10*3/uL (ref 0.0–0.1)
BASOS PCT: 0 %
EOS ABS: 0.2 10*3/uL (ref 0.0–0.7)
EOS PCT: 2 %
HCT: 41.8 % (ref 36.0–46.0)
Hemoglobin: 14 g/dL (ref 12.0–15.0)
LYMPHS PCT: 4 %
Lymphs Abs: 0.5 10*3/uL — ABNORMAL LOW (ref 0.7–4.0)
MCH: 31.3 pg (ref 26.0–34.0)
MCHC: 33.5 g/dL (ref 30.0–36.0)
MCV: 93.5 fL (ref 78.0–100.0)
Monocytes Absolute: 0.7 10*3/uL (ref 0.1–1.0)
Monocytes Relative: 6 %
Neutro Abs: 10.5 10*3/uL — ABNORMAL HIGH (ref 1.7–7.7)
Neutrophils Relative %: 88 %
PLATELETS: 211 10*3/uL (ref 150–400)
RBC: 4.47 MIL/uL (ref 3.87–5.11)
RDW: 13.7 % (ref 11.5–15.5)
WBC: 11.9 10*3/uL — AB (ref 4.0–10.5)

## 2015-03-11 LAB — PREGNANCY, URINE: Preg Test, Ur: NEGATIVE

## 2015-03-11 LAB — D-DIMER, QUANTITATIVE: D-Dimer, Quant: 0.85 ug/mL-FEU — ABNORMAL HIGH (ref 0.00–0.48)

## 2015-03-11 LAB — TROPONIN I: Troponin I: <0.03 ng/mL (ref <0.031)

## 2015-03-11 LAB — BRAIN NATRIURETIC PEPTIDE: B NATRIURETIC PEPTIDE 5: 95.7 pg/mL (ref 0.0–100.0)

## 2015-03-11 MED ORDER — ALBUTEROL SULFATE (2.5 MG/3ML) 0.083% IN NEBU
5.0000 mg | INHALATION_SOLUTION | Freq: Once | RESPIRATORY_TRACT | Status: AC
  Administered 2015-03-11: 5 mg via RESPIRATORY_TRACT
  Filled 2015-03-11: qty 6

## 2015-03-11 MED ORDER — MORPHINE SULFATE (PF) 4 MG/ML IV SOLN
4.0000 mg | INTRAVENOUS | Status: DC | PRN
  Administered 2015-03-11: 4 mg via INTRAVENOUS
  Filled 2015-03-11: qty 1

## 2015-03-11 MED ORDER — ACETAMINOPHEN 325 MG PO TABS
650.0000 mg | ORAL_TABLET | Freq: Once | ORAL | Status: AC
  Administered 2015-03-11: 650 mg via ORAL
  Filled 2015-03-11: qty 2

## 2015-03-11 MED ORDER — HYDROMORPHONE HCL 1 MG/ML IJ SOLN: 1.0000 mg | INTRAMUSCULAR | Status: DC | PRN

## 2015-03-11 MED ORDER — PIPERACILLIN-TAZOBACTAM 3.375 G IVPB 30 MIN
3.3750 g | Freq: Once | INTRAVENOUS | Status: AC
Start: 2015-03-11 — End: 2015-03-11
  Administered 2015-03-11: 3.375 g via INTRAVENOUS
  Filled 2015-03-11 (×2): qty 50

## 2015-03-11 MED ORDER — VANCOMYCIN HCL IN DEXTROSE 1-5 GM/200ML-% IV SOLN: 1000.0000 mg | Freq: Two times a day (BID) | INTRAVENOUS | Status: DC

## 2015-03-11 MED ORDER — SODIUM CHLORIDE 0.9 % IV BOLUS (SEPSIS)
1000.0000 mL | INTRAVENOUS | Status: AC
  Administered 2015-03-11 (×2): 1000 mL via INTRAVENOUS

## 2015-03-11 MED ORDER — IPRATROPIUM-ALBUTEROL 0.5-2.5 (3) MG/3ML IN SOLN
3.0000 mL | Freq: Four times a day (QID) | RESPIRATORY_TRACT | Status: DC
  Administered 2015-03-12 – 2015-03-13 (×6): 3 mL via RESPIRATORY_TRACT
  Filled 2015-03-11 (×6): qty 3

## 2015-03-11 MED ORDER — METHYLPREDNISOLONE SODIUM SUCC 125 MG IJ SOLR
125.0000 mg | Freq: Once | INTRAMUSCULAR | Status: AC
Start: 2015-03-11 — End: 2015-03-11
  Administered 2015-03-11: 125 mg via INTRAVENOUS
  Filled 2015-03-11: qty 2

## 2015-03-11 MED ORDER — ONDANSETRON HCL 4 MG PO TABS: 4.0000 mg | ORAL_TABLET | Freq: Four times a day (QID) | ORAL | Status: DC | PRN

## 2015-03-11 MED ORDER — ACETAMINOPHEN 325 MG PO TABS: 650.0000 mg | ORAL_TABLET | Freq: Four times a day (QID) | ORAL | Status: DC | PRN

## 2015-03-11 MED ORDER — OSELTAMIVIR PHOSPHATE 75 MG PO CAPS: 75.0000 mg | ORAL_CAPSULE | Freq: Once | ORAL | Status: DC

## 2015-03-11 MED ORDER — IOHEXOL 350 MG/ML SOLN
100.0000 mL | Freq: Once | INTRAVENOUS | Status: AC | PRN
  Administered 2015-03-11: 100 mL via INTRAVENOUS

## 2015-03-11 MED ORDER — VANCOMYCIN HCL IN DEXTROSE 1-5 GM/200ML-% IV SOLN
1000.0000 mg | Freq: Once | INTRAVENOUS | Status: AC
  Administered 2015-03-11: 1000 mg via INTRAVENOUS
  Filled 2015-03-11: qty 200

## 2015-03-11 MED ORDER — METHYLPREDNISOLONE SODIUM SUCC 125 MG IJ SOLR
60.0000 mg | Freq: Two times a day (BID) | INTRAMUSCULAR | Status: DC
  Administered 2015-03-12 – 2015-03-13 (×3): 60 mg via INTRAVENOUS
  Filled 2015-03-11 (×3): qty 2

## 2015-03-11 MED ORDER — ONDANSETRON HCL 4 MG/2ML IJ SOLN: 4.0000 mg | Freq: Three times a day (TID) | INTRAMUSCULAR | Status: DC | PRN

## 2015-03-11 MED ORDER — ONDANSETRON HCL 4 MG/2ML IJ SOLN
4.0000 mg | Freq: Once | INTRAMUSCULAR | Status: AC
  Administered 2015-03-11: 4 mg via INTRAVENOUS
  Filled 2015-03-11: qty 2

## 2015-03-11 MED ORDER — SODIUM CHLORIDE 0.9 % IV BOLUS (SEPSIS)
500.0000 mL | INTRAVENOUS | Status: AC
  Administered 2015-03-11: 1000 mL via INTRAVENOUS

## 2015-03-11 MED ORDER — AZITHROMYCIN 500 MG IV SOLR
500.0000 mg | Freq: Every day | INTRAVENOUS | Status: DC
  Administered 2015-03-12 (×2): 500 mg via INTRAVENOUS
  Filled 2015-03-11 (×2): qty 500

## 2015-03-11 MED ORDER — SODIUM CHLORIDE 0.9 % IJ SOLN
3.0000 mL | Freq: Two times a day (BID) | INTRAMUSCULAR | Status: DC
  Administered 2015-03-12 – 2015-03-13 (×4): 3 mL via INTRAVENOUS

## 2015-03-11 MED ORDER — VANCOMYCIN HCL IN DEXTROSE 1-5 GM/200ML-% IV SOLN
1000.0000 mg | Freq: Two times a day (BID) | INTRAVENOUS | Status: DC
  Administered 2015-03-12 – 2015-03-13 (×3): 1000 mg via INTRAVENOUS
  Filled 2015-03-11 (×3): qty 200

## 2015-03-11 MED ORDER — PIPERACILLIN-TAZOBACTAM 3.375 G IVPB: 3.3750 g | Freq: Three times a day (TID) | INTRAVENOUS | Status: DC

## 2015-03-11 MED ORDER — GUAIFENESIN ER 600 MG PO TB12
600.0000 mg | ORAL_TABLET | Freq: Two times a day (BID) | ORAL | Status: DC
  Administered 2015-03-12: 600 mg via ORAL
  Filled 2015-03-11 (×2): qty 1

## 2015-03-11 MED ORDER — DEXTROSE 5 % IV SOLN
2.0000 g | Freq: Two times a day (BID) | INTRAVENOUS | Status: DC
  Filled 2015-03-11: qty 2

## 2015-03-11 MED ORDER — ONDANSETRON HCL 4 MG/2ML IJ SOLN: 4.0000 mg | Freq: Four times a day (QID) | INTRAMUSCULAR | Status: DC | PRN

## 2015-03-11 MED ORDER — ACETAMINOPHEN 650 MG RE SUPP: 650.0000 mg | Freq: Four times a day (QID) | RECTAL | Status: DC | PRN

## 2015-03-11 MED ORDER — ALBUTEROL SULFATE (2.5 MG/3ML) 0.083% IN NEBU
2.5000 mg | INHALATION_SOLUTION | Freq: Once | RESPIRATORY_TRACT | Status: DC
  Administered 2015-03-11: 2.5 mg via RESPIRATORY_TRACT
  Filled 2015-03-11: qty 3

## 2015-03-11 MED ORDER — LEVOTHYROXINE SODIUM 137 MCG PO TABS
137.0000 ug | ORAL_TABLET | Freq: Every day | ORAL | Status: DC
  Administered 2015-03-12 – 2015-03-13 (×2): 137 ug via ORAL
  Filled 2015-03-11 (×5): qty 1

## 2015-03-11 NOTE — ED Notes (Signed)
Patient transported to CT 

## 2015-03-11 NOTE — ED Notes (Addendum)
Patient has received a total of 5500 ml of normal saline. One dose of vancomycin, zosyn, morphine, zofran and solumedrol.  Rest of the 6th liter of fluid is infusing while being transported by [facility] .

## 2015-03-11 NOTE — ED Notes (Signed)
Report given to Sharyn Lull.

## 2015-03-11 NOTE — Progress Notes (Signed)
Pt arrived via [facility] to ICU/ SD room 1240. She arrived alert and oriented, no dyspnea assessed or SOB during transfer. Pt is on oxygen via [place] at 4 liters. MD was paged to room upon time of arrival.

## 2015-03-11 NOTE — ED Notes (Addendum)
Pt with 2 day history of shortness of breath, cough, progressive worsening. Lungs diminished throughout. Pt also has sore throat, headache, and body aches.

## 2015-03-11 NOTE — ED Notes (Signed)
LAC drawn by RN, Panic results of 2.96 hand delivered to Dr. Tamera Punt

## 2015-03-11 NOTE — ED Notes (Signed)
LAC reran to verify results. Panic of 2.88 hand delivered to Dr. Tamera Punt.

## 2015-03-11 NOTE — ED Provider Notes (Signed)
CSN: [id number]     Arrival date & time 03/11/15  1717 History   First MD Initiated Contact with Patient 03/11/15 1728     Chief Complaint  Patient presents with  . Shortness of Breath     (Consider location/radiation/quality/duration/timing/severity/associated sxs/prior Treatment) HPI   Blood pressure 122/68, pulse 129, temperature 100.7 F (38.2 C), temperature source Oral, resp. rate 32, height 5\' 5"  (1.651 m), weight 225 lb (102.059 kg), SpO2 93 %.  Ashley Mckee is a 49 y.o. 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 Denies fever, chills, increasing peripheral edema, calf pain or leg swelling. She has been taking topical estrogen ointment for rash in the vaginal area. She is no prior history of DVT or PE. Patient recently traveled to [place] via flight to [place] and then driving there and back approximately one month ago. No recent travel, no hospitalizations in the last 2 months. Patient has sick contact in a coworker was an upper respiratory infection. Patient denies rhinorrhea. She states that she has muscle aches and pains which she attributes to sitting up all night last night because she states that when she lay down flat she couldn't breathe. She has a headache today which she attributes to lack of sleep. Patient got a flu shot 2 weeks ago. Denies abdominal pain, dysuria, hematuria.   Past Medical History  Diagnosis Date  . Thyroid disease   . Heart murmur   . CHF (congestive heart failure) (HCC)     Last EF was >50%   . 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    . Non Hodgkin's lymphoma ([place]) 2000    Non Hodgkin's Lymphoma (Bone Marrow Transplant)  - [place]    Past Surgical History  Procedure Laterality Date  . Tubal ligation    . Lymph node biopsy    . Bone marrow transplant     Family History  Problem Relation Age of Onset  . Hypertension Mother   . Diabetes Father   . Hypertension Father   . Stomach cancer Maternal Grandmother    Social History  Substance Use Topics  . Smoking status: Former Smoker -- 1.50 packs/day for 17 years    Types: Cigarettes    Quit date: 05/28/1999  . Smokeless tobacco: None  . Alcohol Use: Yes     Comment: Rarely    OB History    No data available     Review of Systems  10 systems reviewed and found to be negative, except as noted in the HPI.   Allergies  Review of patient's allergies indicates no known allergies.  Home Medications   Prior to Admission medications   Medication Sig Start Date End Date Taking? Authorizing Provider  albuterol (PROVENTIL HFA;VENTOLIN HFA) 108 (90 BASE) MCG/ACT inhaler Inhale 1 puff into the lungs every 6 (six) hours as needed for wheezing or shortness of breath.   Yes Historical Provider, MD  clotrimazole-betamethasone (LOTRISONE) cream Apply 1 application topically 3 (three) times daily as needed.   Yes Historical Provider, MD  hydrocortisone (ANUSOL-HC) 2.5 % rectal cream Place 1 application rectally 2 (two) times daily.   Yes Historical Provider, MD  hydrocortisone-pramoxine (PROCTOFOAM HC) rectal foam Place 1 applicator rectally 2 (  two) times daily.   Yes Historical Provider, MD  lisinopril (PRINIVIL,ZESTRIL) 10 MG tablet Take 1 tablet (10 mg total) by mouth daily. 01/20/15 02/07/17 Yes Yvonne R Lowne, DO  nystatin cream (MYCOSTATIN) Apply 1 application topically 2 (two) times daily.   Yes Historical Provider, MD  pramoxine (PROCTOFOAM) 1 % foam Place 1 application rectally 3 (three) times daily as needed for itching. 01/19/15  Yes Rosalita Chessman, DO  SYNTHROID 137 MCG tablet Take 1 tablet (137 mcg total) by mouth daily before breakfast. 01/20/15 04/26/17 Yes Yvonne  R Lowne, DO   BP 104/49 mmHg  Pulse 124  Temp(Src) 100.7 F (38.2 C) (Oral)  Resp 20  Ht 5\' 5"  (1.651 m)  Wt 225 lb (102.059 kg)  BMI 37.44 kg/m2  SpO2 92% Physical Exam  Constitutional: She is oriented to person, place, and time. She appears well-developed and well-nourished. No distress.  HENT:  Head: Normocephalic.  Mouth/Throat: Oropharynx is clear and moist.  Eyes: Conjunctivae and EOM are normal. Pupils are equal, round, and reactive to light.  Neck: Normal range of motion. No JVD present. No tracheal deviation present.  Cardiovascular: Normal rate, regular rhythm and intact distal pulses.   Radial pulse equal bilaterally  Pulmonary/Chest: Effort normal. No stridor. No respiratory distress. She has wheezes. She has no rales. She exhibits no tenderness.  Very poor air movement in all fields, trace scattered expiratory wheezing. Patient is in no respiratory distress, she is speaking in complete sentences. No accessory muscle use.  Abdominal: Soft. Bowel sounds are normal. She exhibits no distension and no mass. There is no tenderness. There is no rebound and no guarding.  Musculoskeletal: Normal range of motion. She exhibits no edema or tenderness.  No calf asymmetry, superficial collaterals, palpable cords, edema, Homans sign negative bilaterally.    Neurological: She is alert and oriented to person, place, and time.  Skin: Skin is warm. She is not diaphoretic.  Psychiatric: She has a normal mood and affect.  Nursing note and vitals reviewed.   ED Course  Procedures (including critical care time)  CRITICAL CARE Performed by: Monico Blitz   Total critical care time: 45  Critical care time was exclusive of separately billable procedures and treating other patients.  Critical care was necessary to treat or prevent imminent or life-threatening deterioration.  Critical care was time spent personally by me on the following activities: development of treatment plan  with patient and/or surrogate as well as nursing, discussions with consultants, evaluation of patient's response to treatment, examination of patient, obtaining history from patient or surrogate, ordering and performing treatments and interventions, ordering and review of laboratory studies, ordering and review of radiographic studies, pulse oximetry and re-evaluation of patient's condition.  Labs Review Labs Reviewed  CBC WITH DIFFERENTIAL/PLATELET - Abnormal; Notable for the following:    WBC 11.9 (*)    Neutro Abs 10.5 (*)    Lymphs Abs 0.5 (*)    All other components within normal limits  COMPREHENSIVE METABOLIC PANEL - Abnormal; Notable for the following:    Glucose, Bld 142 (*)    AST 89 (*)    ALT 69 (*)    All other components within normal limits  URINALYSIS, ROUTINE W REFLEX MICROSCOPIC (NOT AT [facility]) - Abnormal; Notable for the following:    APPearance CLOUDY (*)    Specific Gravity, Urine >1.046 (*)    All other components within normal limits  D-DIMER, QUANTITATIVE (NOT AT [facility]) - Abnormal; Notable for the following:  D-Dimer, Quant 0.85 (*)    All other components within normal limits  CULTURE, BLOOD (ROUTINE X 2)  CULTURE, BLOOD (ROUTINE X 2)  URINE CULTURE  TROPONIN I  BRAIN NATRIURETIC PEPTIDE  PREGNANCY, URINE  I-STAT CG4 LACTIC ACID, ED  I-STAT CG4 LACTIC ACID, ED    Imaging Review Dg Chest 2 View  03/11/2015  CLINICAL DATA:  Cough and shortness of breath for 2 days. Non-Hodgkin's lymphoma. EXAM: CHEST  2 VIEW COMPARISON:  None. FINDINGS: Heart size and mediastinal contours are within normal limits. Elevation of left hemidiaphragm noted. No evidence of pulmonary infiltrate or edema. No evidence of pneumothorax or pleural effusion. Surgical clips noted in anterior mediastinum. IMPRESSION: Elevated left hemidiaphragm.  No active lung disease. Electronically Signed   By: Earle Gell M.D.   On: 03/11/2015 18:45   I have personally reviewed and evaluated these  images and lab results as part of my medical decision-making.  EKG showssinus tachycardia at 137 bpm with a left axis deviation, normal PR interval, normal QTC. Nonspecific T wave changes. No prior for comparison.  MDM   Final diagnoses:  Hypoxia  SIRS (systemic inflammatory response syndrome) (HCC)  Mediastinal mass  Transaminitis  Elevated lactic acid level    Filed Vitals:   03/11/15 1800 03/11/15 1804 03/11/15 1815 03/11/15 1903  BP:  118/61 121/72 104/49  Pulse: 129 138 135 124  Temp:      TempSrc:      Resp: 23 27 24 20   Height:      Weight:      SpO2: 100% 100% 90% 92%    Medications  sodium chloride 0.9 % bolus 1,000 mL (0 mLs Intravenous Stopped 03/11/15 2022)    Followed by  sodium chloride 0.9 % bolus 500 mL (not administered)  morphine 4 MG/ML injection 4 mg (4 mg Intravenous Given 03/11/15 1756)  acetaminophen (TYLENOL) tablet 650 mg (650 mg Oral Given 03/11/15 1752)  albuterol (PROVENTIL) (2.5 MG/3ML) 0.083% nebulizer solution 5 mg (5 mg Nebulization Given 03/11/15 1752)  methylPREDNISolone sodium succinate (SOLU-MEDROL) 125 mg/2 mL injection 125 mg (125 mg Intravenous Given 03/11/15 1756)  ondansetron (ZOFRAN) injection 4 mg (4 mg Intravenous Given 03/11/15 1756)  iohexol (OMNIPAQUE) 350 MG/ML injection 100 mL (100 mLs Intravenous Contrast Given 03/11/15 1943)    Ashley Mckee is 49 y.o. female presenting with pleuritic chest pain and shortness of breath associated with dry cough onset this morning. Patient is tachycardic at 130 and hypoxic to under 90% on room air. Patient has a low-grade fever. Poor movement in all fields, trace scattered expiratory wheezing. Physical exam is not consistent with DVT.  Sepsis protocol initiated with aggressive hydration. We'll hold off on antibiotics.  Patient has a normal lactic acid, white blood cell count mildly elevated at 11.9. She also has mild transaminitis at 89/69. Patient is not a heavy drinker, has no history of liver  problems. Patient states that she had a glass of whiskey last night. Chest x-ray with no acute abnormalities. EKG shows sinus tach with nonspecific T-wave changes. Troponin normal.  Dimer is elevated, will obtain CT to rule out PE. Patient states she is significantly more comfortable with supplemental oxygen and morphine for pain control.  Patient has received 2 L of fluid remains tachycardic in the 120s.  Patient has an anterior mediastinal mass. This may be consistent with prior lymphoma treatment versus recurrence. Patient was treated for her lymphoma in [place], she follows yearly with oncologist at [facility] for checkup. No prior  chest CT is available. Unclear what the source of her fever is. Patient will be started on broad-spectrum antibiotics. This may be influenza however, do not have flu testing at this freestanding ED.  Patient recently got a job at [employer], her primary care physician is Dr. Etter Sjogren. She had followed at [facility] for yearly checkups on her lymphoma with oncologist Dr. Kemper Durie, she is considering changing oncology to consult will be in network. [facility].  Cardiothoracic consult from Dr. Roxan Hockey appreciated: He requests transfer to [facility] and admission to hospitalist service. He is evaluated the CT and states that the location will be very hard to access without median sternotomy. Recommends medical admission and discussing this with oncology to see if biopsy is critical.   9:33 PM: Patient's lactic acid is rising to 2.96. Patient has been aggressively hydrated and unclear why the lactic acid is rising. Will redraw on recheck.  Patient has received 4 L normal saline, lactic acid is 2.88.  Oncology consult Lanice Schwab appreciated: She states that a tissue biopsy will be essential in guiding treatment. States that interventional radiology option. Request transfer to [facility] recommends  interventional radiology try a needle aspiration biopsy. She wants to hold off on starting steroids until they have a tissue sample. Patient will be evaluated by the oncology team in the morning.  Case discussed with [facility]ad hospitalist Dr. Posey Pronto who accepts admission to a step down bed at [facility]. We discussed her rising lactic acid level and the possibility of influenza. Unfortunately, we cannot obtain flu testing in this ED. Recommends holding off on Tamiflu until he is able to obtain swab for testing.   ABG with no significant abnormalities.    Monico Blitz, PA-C 03/11/15 [place], MD 03/11/15 [phone]

## 2015-03-11 NOTE — Progress Notes (Signed)
ANTIBIOTIC CONSULT NOTE - INITIAL  Pharmacy Consult for Vancomycin, Zosyn Indication: rule out sepsis  No Known Allergies  Patient Measurements: Height: 5\' 5"  (165.1 cm) Weight: 225 lb (102.059 kg) IBW/kg (Calculated) : 57  Vital Signs: Temp: 99.5 F (37.5 C) (10/15 2015) Temp Source: Axillary (10/15 2015) BP: 100/43 mmHg (10/15 2015) Pulse Rate: 122 (10/15 2015) Intake/Output from previous day:   Intake/Output from this shift:    Labs:  Recent Labs  03/11/15 1730  WBC 11.9*  HGB 14.0  PLT 211  CREATININE 0.85   Estimated Creatinine Clearance: 96.9 mL/min (by C-G formula based on Cr of 0.85). No results for input(s): VANCOTROUGH, VANCOPEAK, VANCORANDOM, GENTTROUGH, GENTPEAK, GENTRANDOM, TOBRATROUGH, TOBRAPEAK, TOBRARND, AMIKACINPEAK, AMIKACINTROU, AMIKACIN in the last 72 hours.   Microbiology: No results found for this or any previous visit (from the past 720 hour(s)).  Medical History: Past Medical History  Diagnosis Date  . Thyroid disease   . Heart murmur   . CHF (congestive heart failure) (HCC)     Last EF was >50%   . 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    . Non Hodgkin's lymphoma ([place]) 2000    Non Hodgkin's Lymphoma (Bone Marrow Transplant)  - [facility]    Assessment: 49 year old female with a 2 day history of shortness of breath, cough, progressive breath sounds.  To begin Vancomycin and Zosyn for rule out sepsis.   Goal of Therapy:  Vancomycin trough level 15-20 mcg/ml  Plan:  Zosyn 3.375 grams iv Q 8 hours - 4 hr infusion Vancomycin 1 gram iv Q 12 hours Follow up Scr, cultures, progress  Thank you Anette Guarneri, PharmD [phone]  03/11/2015,8:34 PM

## 2015-03-12 ENCOUNTER — Other Ambulatory Visit: Payer: Self-pay | Admitting: Hematology and Oncology

## 2015-03-12 ENCOUNTER — Inpatient Hospital Stay (HOSPITAL_COMMUNITY): Payer: 59

## 2015-03-12 DIAGNOSIS — C8590 Non-Hodgkin lymphoma, unspecified, unspecified site: Secondary | ICD-10-CM | POA: Diagnosis present

## 2015-03-12 DIAGNOSIS — Z9484 Stem cells transplant status: Secondary | ICD-10-CM

## 2015-03-12 DIAGNOSIS — Z8572 Personal history of non-Hodgkin lymphomas: Secondary | ICD-10-CM

## 2015-03-12 DIAGNOSIS — J449 Chronic obstructive pulmonary disease, unspecified: Secondary | ICD-10-CM

## 2015-03-12 DIAGNOSIS — J189 Pneumonia, unspecified organism: Principal | ICD-10-CM

## 2015-03-12 DIAGNOSIS — R0602 Shortness of breath: Secondary | ICD-10-CM

## 2015-03-12 DIAGNOSIS — J9859 Other diseases of mediastinum, not elsewhere classified: Secondary | ICD-10-CM

## 2015-03-12 DIAGNOSIS — I509 Heart failure, unspecified: Secondary | ICD-10-CM

## 2015-03-12 DIAGNOSIS — J986 Disorders of diaphragm: Secondary | ICD-10-CM

## 2015-03-12 DIAGNOSIS — R0902 Hypoxemia: Secondary | ICD-10-CM

## 2015-03-12 DIAGNOSIS — J9601 Acute respiratory failure with hypoxia: Secondary | ICD-10-CM

## 2015-03-12 LAB — CBC
HCT: 36.1 % (ref 36.0–46.0)
Hemoglobin: 12 g/dL (ref 12.0–15.0)
MCH: 30.9 pg (ref 26.0–34.0)
MCHC: 33.2 g/dL (ref 30.0–36.0)
MCV: 93 fL (ref 78.0–100.0)
Platelets: 179 10*3/uL (ref 150–400)
RBC: 3.88 MIL/uL (ref 3.87–5.11)
RDW: 13.7 % (ref 11.5–15.5)
WBC: 10.9 10*3/uL — ABNORMAL HIGH (ref 4.0–10.5)

## 2015-03-12 LAB — BRAIN NATRIURETIC PEPTIDE: B NATRIURETIC PEPTIDE 5: 183.5 pg/mL — AB (ref 0.0–100.0)

## 2015-03-12 LAB — COMPREHENSIVE METABOLIC PANEL
ALBUMIN: 3.4 g/dL — AB (ref 3.5–5.0)
ALK PHOS: 73 U/L (ref 38–126)
ALT: 74 U/L — AB (ref 14–54)
AST: 62 U/L — AB (ref 15–41)
Anion gap: 8 (ref 5–15)
BILIRUBIN TOTAL: 0.4 mg/dL (ref 0.3–1.2)
BUN: 9 mg/dL (ref 6–20)
CALCIUM: 8 mg/dL — AB (ref 8.9–10.3)
CO2: 25 mmol/L (ref 22–32)
CREATININE: 0.79 mg/dL (ref 0.44–1.00)
Chloride: 104 mmol/L (ref 101–111)
GFR calc Af Amer: >60 mL/min (ref >60)
GFR calc non Af Amer: >60 mL/min (ref >60)
GLUCOSE: 227 mg/dL — AB (ref 65–99)
Potassium: 4.3 mmol/L (ref 3.5–5.1)
Sodium: 137 mmol/L (ref 135–145)
TOTAL PROTEIN: 6.7 g/dL (ref 6.5–8.1)

## 2015-03-12 LAB — PROTIME-INR
INR: 1.11 (ref 0.00–1.49)
PROTHROMBIN TIME: 14.5 s (ref 11.6–15.2)

## 2015-03-12 LAB — INFLUENZA PANEL BY PCR (TYPE A & B)
H1N1 flu by pcr: NOT DETECTED
Influenza A By PCR: NEGATIVE
Influenza B By PCR: NEGATIVE

## 2015-03-12 LAB — GLUCOSE, CAPILLARY
GLUCOSE-CAPILLARY: 171 mg/dL — AB (ref 65–99)
GLUCOSE-CAPILLARY: 294 mg/dL — AB (ref 65–99)
Glucose-Capillary: 189 mg/dL — ABNORMAL HIGH (ref 65–99)

## 2015-03-12 LAB — STREP PNEUMONIAE URINARY ANTIGEN: STREP PNEUMO URINARY ANTIGEN: NEGATIVE

## 2015-03-12 LAB — LACTIC ACID, PLASMA: Lactic Acid, Venous: 2.2 mmol/L (ref 0.5–2.0)

## 2015-03-12 LAB — TROPONIN I
Troponin I: <0.03 ng/mL (ref <0.031)
Troponin I: <0.03 ng/mL (ref <0.031)

## 2015-03-12 LAB — MRSA PCR SCREENING: MRSA by PCR: NEGATIVE

## 2015-03-12 MED ORDER — INSULIN ASPART 100 UNIT/ML ~~LOC~~ SOLN
0.0000 [IU] | Freq: Three times a day (TID) | SUBCUTANEOUS | Status: DC
  Administered 2015-03-12 (×2): 2 [IU] via SUBCUTANEOUS
  Administered 2015-03-13: 5 [IU] via SUBCUTANEOUS
  Administered 2015-03-13: 3 [IU] via SUBCUTANEOUS

## 2015-03-12 MED ORDER — GUAIFENESIN ER 600 MG PO TB12
600.0000 mg | ORAL_TABLET | Freq: Once | ORAL | Status: AC
  Administered 2015-03-12: 600 mg via ORAL
  Filled 2015-03-12: qty 1

## 2015-03-12 MED ORDER — KETOROLAC TROMETHAMINE 15 MG/ML IJ SOLN
7.5000 mg | Freq: Once | INTRAMUSCULAR | Status: AC
  Administered 2015-03-12: 7.5 mg via INTRAVENOUS
  Filled 2015-03-12: qty 1

## 2015-03-12 MED ORDER — DEXTROSE 5 % IV SOLN
2.0000 g | Freq: Every day | INTRAVENOUS | Status: DC
  Administered 2015-03-12 (×2): 2 g via INTRAVENOUS
  Filled 2015-03-12 (×3): qty 2

## 2015-03-12 MED ORDER — GUAIFENESIN ER 600 MG PO TB12
1200.0000 mg | ORAL_TABLET | Freq: Two times a day (BID) | ORAL | Status: DC
  Administered 2015-03-12 – 2015-03-13 (×2): 1200 mg via ORAL
  Filled 2015-03-12 (×2): qty 2

## 2015-03-12 MED ORDER — SODIUM CHLORIDE 0.9 % IV BOLUS (SEPSIS)
250.0000 mL | Freq: Once | INTRAVENOUS | Status: AC
  Administered 2015-03-12: 250 mL via INTRAVENOUS

## 2015-03-12 MED ORDER — PERFLUTREN LIPID MICROSPHERE
1.0000 mL | INTRAVENOUS | Status: AC | PRN
  Filled 2015-03-12: qty 10

## 2015-03-12 NOTE — Progress Notes (Signed)
Report called and given to [place] on [place]. Patient's room needs to be zapped. Cindy to notify this rn when room is ready for pt to be transferred. Vwilliams,rn.

## 2015-03-12 NOTE — Progress Notes (Signed)
PHARMACY NOTE -  Rocephin  Pharmacy has been assisting with dosing of Rocephin for bronchitis. Dosage remains stable at 2g IV q24h and need for further dosage adjustment appears unlikely at present.    Will sign off at this time.  Please reconsult if a change in clinical status warrants re-evaluation of dosage.  Romeo Rabon, PharmD, pager [phone]. 03/12/2015,2:12 PM.

## 2015-03-12 NOTE — Progress Notes (Signed)
SCD's in pt  Room,states she will put on before bed. Pt will ambulate after dinner with husband.

## 2015-03-12 NOTE — Plan of Care (Signed)
Problem: Phase I Progression Outcomes Goal: Pain controlled with appropriate interventions Outcome: Progressing Denies pain    Goal: Voiding-avoid urinary catheter unless indicated Outcome: Progressing Pt voided with 1 assist to [facility]

## 2015-03-12 NOTE — H&P (Addendum)
[facility] History and Physical  Patient: Ashley Mckee  MRN: [id number]  DOB: 01-12-1966  DOS: the patient was seen and examined on 03/11/2015 PCP: Garnet Koyanagi, DO  Referring physician: [facility] [place] Chief Complaint: shortness of breath  HPI: Ashley Mckee is a 49 y.o. female with Past medical history of non-Hodgkin's lymphoma status post chemotherapy 2 as well as radiation therapy treated in [place], diastolic CHF, hypothyroidism, history of autologous bone marrow transplantation 2001 The patient is presenting with numbness of one day history of dry cough as well as shortness of breath. Patient also complains of pleuritic chest pain. Patient did have some fever as well as chills. Patient complained of some nausea but no vomiting. No abdominal pain no acid reflux no diarrhea no constipation. Next and patient denies any burning urination. Patient denies any similar symptoms in the past. Patient mentions at work she had one for coworker having similar respiratory symptoms. She denies any active smoking or drug abuse. She denies any leg swelling. Patient mentions that she had a long flight to [place] and a long drive to [place] a month ago. She mentions that her baseline she does have some orthopnea. But recently her breathing has progressively worsened last night and she couldn't sleep. Patient also mentions she has bilateral leg swelling which is not worsening. Patient also mentions her weight has been remaining stable.  The patient is coming from home.  At her baseline ambulates without support And is independent for most of her ADL; manages her medication on her own.  Review of Systems: as mentioned in the history of present illness.  A comprehensive review of the other systems is negative.  Past Medical History  Diagnosis Date  . Thyroid disease   . Heart murmur   . CHF (congestive heart failure) (HCC)     Last EF was >50%   . Diaphragm dysfunction     Left Side - She  thinks this resulted after her chemotherapy.    . Non Hodgkin's lymphoma ([place]) 2000    Non Hodgkin's Lymphoma (Bone Marrow Transplant)  - [place]  . Autologous bone marrow transplantation status ([place]) 2001   Past Surgical History  Procedure Laterality Date  . Tubal ligation    . Lymph node biopsy    . Bone marrow transplant     Social History:  reports that she quit smoking about 15 years ago. Her smoking use included Cigarettes. She has a 25.5 pack-year smoking history. She does not have any smokeless tobacco history on file. She reports that she drinks alcohol. She reports that she does not use illicit drugs.  No Known Allergies  Family History  Problem Relation Age of Onset  . Hypertension Mother   . Diabetes Father   . Hypertension Father   . Stomach cancer Maternal Grandmother     Prior to Admission medications   Medication Sig Start Date End Date Taking? Authorizing Provider  albuterol (PROVENTIL HFA;VENTOLIN HFA) 108 (90 BASE) MCG/ACT inhaler Inhale 1 puff into the lungs every 6 (six) hours as needed for wheezing or shortness of breath.   Yes Historical Provider, MD  clotrimazole-betamethasone (LOTRISONE) cream Apply 1 application topically 3 (three) times daily as needed.   Yes Historical Provider, MD  lisinopril (PRINIVIL,ZESTRIL) 10 MG tablet Take 1 tablet (10 mg total) by mouth daily. 01/20/15 02/07/17 Yes Rosalita Chessman, DO  SYNTHROID 137 MCG tablet Take 1 tablet (137 mcg total) by mouth daily before breakfast. 01/20/15 04/26/17 Yes  Rosalita Chessman, DO  pramoxine (PROCTOFOAM) 1 % foam Place 1 application rectally 3 (three) times daily as needed for itching. Patient not taking: Reported on 03/12/2015 01/19/15   Rosalita Chessman, DO    Physical Exam: Filed Vitals:   03/11/15 2233 03/11/15 2356 03/12/15 0000 03/12/15 0112  BP: 108/62     Pulse: 96     Temp:   98.6 F (37 C)   TempSrc:   Oral   Resp: 20     Height:      Weight:      SpO2: 100% 96%   97%    General: Alert, Awake and Oriented to Time, Place and Person. Appear in mild distress Eyes: PERRL ENT: Oral Mucosa clear moist. Neck: no JVD Cardiovascular: S1 and S2 Present, no Murmur, Peripheral Pulses Present Respiratory: Bilateral Air entry equal and Decreased,  no Crackles, right basal expiratory wheezes Abdomen: Bowel Sound present, Soft and no tenderness Skin: no Rash Extremities: bilateral Pedal edema, no calf tenderness Neurologic: Grossly no focal neuro deficit.  Labs on Admission:  CBC:  Recent Labs Lab 03/11/15 1730  WBC 11.9*  NEUTROABS 10.5*  HGB 14.0  HCT 41.8  MCV 93.5  PLT 211    CMP     Component Value Date/Time   NA 139 03/11/2015 1730   K 3.9 03/11/2015 1730   CL 102 03/11/2015 1730   CO2 32 03/11/2015 1730   GLUCOSE 142* 03/11/2015 1730   BUN 11 03/11/2015 1730   CREATININE 0.85 03/11/2015 1730   CALCIUM 9.3 03/11/2015 1730   PROT 7.7 03/11/2015 1730   ALBUMIN 4.2 03/11/2015 1730   AST 89* 03/11/2015 1730   ALT 69* 03/11/2015 1730   ALKPHOS 91 03/11/2015 1730   BILITOT 0.9 03/11/2015 1730   GFRNONAA >60 03/11/2015 1730   GFRAA >60 03/11/2015 1730     Recent Labs Lab 03/11/15 1730 03/12/15 0010  TROPONINI <0.03 <0.03   BNP (last 3 results)  Recent Labs  03/11/15 1740  BNP 95.7    ProBNP (last 3 results) No results for input(s): PROBNP in the last 8760 hours.   Radiological Exams on Admission: Dg Chest 2 View  03/11/2015  CLINICAL DATA:  Cough and shortness of breath for 2 days. Non-Hodgkin's lymphoma. EXAM: CHEST  2 VIEW COMPARISON:  None. FINDINGS: Heart size and mediastinal contours are within normal limits. Elevation of left hemidiaphragm noted. No evidence of pulmonary infiltrate or edema. No evidence of pneumothorax or pleural effusion. Surgical clips noted in anterior mediastinum. IMPRESSION: Elevated left hemidiaphragm.  No active lung disease. Electronically Signed   By: Earle Gell M.D.   On: 03/11/2015  18:45   Ct Angio Chest Pe W/cm &/or Wo Cm  03/11/2015  CLINICAL DATA:  Cough with shortness of breath for 2 days. Upper chest pain with coughing. History of non-Hodgkin's lymphoma. EXAM: CT ANGIOGRAPHY CHEST WITH CONTRAST TECHNIQUE: Multidetector CT imaging of the chest was performed using the standard protocol during bolus administration of intravenous contrast. Multiplanar CT image reconstructions and MIPs were obtained to evaluate the vascular anatomy. CONTRAST:  158mL OMNIPAQUE IOHEXOL 350 MG/ML SOLN COMPARISON:  None. FINDINGS: Angiographic study: No evidence of a pulmonary embolus. Thoracic aorta is normal in caliber. No dissection. No atherosclerotic plaque. Thoracic inlet:  No mass or adenopathy. Mediastinum and hila: Low-attenuation soft tissue abuts the main pulmonary artery and contacts the ascending aorta and aortic arch, partly surrounding the innominate, left common carotid artery common origin. Irregular dense material consistent with contrast,  or possibly dense calcification, projects along the left anterior margin of this soft tissue. The left break cephalic vein is markedly narrowed as it passes over the soft tissue. Contrast-enhanced venous blood extends through multiple chest wall collaterals and along the azygos hemi azygous system. No mediastinal or hilar adenopathy. No hilar masses. Heart normal in size and configuration. Lungs and pleura: Bronchovascular opacities noted in the medial left upper lobe, with some associated volume loss, and to a lesser degree in the medial left lower lobe superior segment and medial right upper lobe. This has the appearance of scarring from radiation therapy. There are calcified granuloma in the right upper lobe, lateral left upper lobe and left lower lobe. There is a 6 mm noncalcified nodule in the right upper lobe, image 24, series 10. 2-3 mm nodule lies laterally in the right lower lobe. 4 mm noncalcified nodule lies in the medial right lower lobe, image  38, series 10. There is a focal area of ground-glass opacity in the peripheral right lower lobe centered on image 53. No pulmonary edema. No pleural effusion or pneumothorax. Limited upper abdomen: Fatty infiltration of the liver. Otherwise unremarkable. Musculoskeletal: No osteoblastic or osteolytic lesions. No significant degenerative change. Review of the MIP images confirms the above findings. IMPRESSION: 1. Abnormal anterior mediastinal soft tissue which abuts the main pulmonary artery and aortic arch. This causes significant narrowing of the left brachiocephalic vein, with contrast enhanced venous blood diverted into multiple collaterals. There is dense material within the left anterior aspect of this abnormal soft tissue which may reflect contrast or dense calcification. These findings may be from treated lymphoma. Recurrent lymphoma is possible. 2. No evidence of a pulmonary embolus. 3. No acute findings in the lungs. There is presumed radiation induced scarring along the medial aspect of the left and right upper lobes and superior segment left lower lobe. Several calcified granuloma with several other noncalcified nodules. Recommend followup evaluation of the noncalcified nodules in 6-12 months to document stability. Electronically Signed   By: Lajean Manes M.D.   On: 03/11/2015 20:10   EKG: Independently reviewed. sinus tachycardia.  Assessment/Plan 1. Acute hypoxemic respiratory failure (HCC)  2 Acute bronchitis with bronchospasm. 3 Probable chronic diastolic dysfunction  Patient presents with complaints of 1 day onset of shortness of breath and cough. A CT scan of the other facility was negative for any pulmonary embolism. But it did show radiation injury as well as anterior mediastinal soft tissue mass with compression effect on the brachiocephalic vein. Patient does have multiple collaterals developed from the compression site. At present with wheezing one day onset dry cough as well as  fatigue and body 8 patient more likely appears to be having an acute respiratory infection probably viral with acute bronchiolitis. With this the patient will be admitted in stepdown unit. I will place her on duo nebs, ceftriaxone, azithromycin, vancomycin, will check sputum culture blood culture as well as influenza PCR. We will also check urine antigens. With her history of cardiomyopathy secondary to chemotherapy I will check echocardiogram in the morning. Follow serial troponin.  4. Anterior mediastinal mass. 5  History of non-Hodgkin's lymphoma. Patient was initially discussed with cardiac thoracic surgery  Recommended to discuss with oncology was requested the patient to be transferred to [facility]. With multiple collateral developing from the obstruction of the brachiocephalic vein these finding appears to be chronic in nature and less likely associated with her current presentation of acute respiratory distress.  Oncology will be consulting  with interventional radiology in the morning about possible biopsy of the mass. Patient remains nothing by mouth for the biopsy at present.  6  Hypothyroidism Recent TSH was adequate. Continue Synthroid.  Nutrition: nothing by mouth as of medications DVT Prophylaxis: mechanical compression device for possible procedure  Advance goals of care discussion: full code   Consults: cardiac thoracic surgery as well as oncology  Family Communication: family was present at bedside, opportunity was given to ask question and all questions were answered satisfactorily at the time of interview. Disposition: Admitted as inpatient, step-down unit.  Author: Berle Mull, MD [facility] Pager: [phone] 03/11/2015  If 7PM-7AM, please contact night-coverage [email or web address] Password [email or web address]

## 2015-03-12 NOTE — Progress Notes (Signed)
Rt gave pt IS and flutter valve. Pt knows and understands how to use.

## 2015-03-12 NOTE — Consult Note (Signed)
PULMONARY / CRITICAL CARE MEDICINE   Name: Ashley Mckee MRN: [id number] DOB: January 07, 1966    ADMISSION DATE:  03/11/2015 CONSULTATION DATE:  03/12/2015  REFERRING MD :  Adin Hector  CHIEF COMPLAINT:  Shortness of breath  INITIAL PRESENTATION: 49 year old female with a history of non-Hodgkin's lymphoma in remission and known left diaphragm dysfunction was admitted on 03/11/2015 with hypoxemic respiratory failure.  STUDIES:  03/11/2015 CT angiogram chest: No pulmonary embolism, elevated left hemidiaphragm, anterior mediastinal soft tissue thickening of undetermined significance, left lung radiation scarring noted, scattered pulmonary nodules approximately 5 mm in size, calcified granuloma left lung, small patch of groundglass right lower lobe uncertain etiology  SIGNIFICANT EVENTS: 03/11/2015 admission   HISTORY OF PRESENT ILLNESS:  This is a pleasant 49 year old female who was diagnosed with non-Hodgkin's lymphoma when she was pregnant in the year 2000. She was treated with chemotherapy and radiation therapy and by report her pregnancy went well. Afterward she was treated again with further chemotherapy and then eventually with an autologous stem cell transplant. 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 This was all at [facility]. She then moved to [place] and for the last several years has followed with another health system and adjacent city. She was seen by pulmonologist for 5 years ago and diagnosed with left-sided hemidiaphragm paralysis after a diaphragm fluoroscopy was performed. She says that she's had shortness of breath ever since she had her cancer treatment many years ago. Specifically, she says that whenever she carries laundry up a flight of stairs she becomes markedly short of breath. She does not exercise regular. She's gained at least 20 pounds in the last 2-3 years.  03/10/2015  she was in her usual state of health until she developed a cough. She said this is associated with increasing shortness of breath and inability to lay flat. She felt "something" in her chest rattling around. She had a chill and she said she had a low-grade fever on Saturday. She does note a sick contact with a coworker who had similar symptoms for several days prior to her illness. She denied sinus symptoms but said that she had some nausea but no vomiting.  In the emergency department she was found to be hypoxemic and she was treated with nasal cannula oxygen. A CT scan of her chest was performed as well as a chest x-ray, see my description of these above.  Pulmonary and critical care medicine was consulted on 03/12/2015 for evaluation of hypoxemic respiratory failure. In general, she says she's feeling a bit better since she came to the hospital.   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  has past surgical history that includes Tubal ligation; Lymph node biopsy; and Bone marrow transplant. Prior to Admission medications   Medication Sig Start Date End Date Taking? Authorizing Provider  albuterol (PROVENTIL HFA;VENTOLIN HFA) 108 (90 BASE) MCG/ACT inhaler Inhale 1 puff into the lungs every 6 (six) hours as needed for wheezing or shortness of breath.   Yes Historical Provider, MD  clotrimazole-betamethasone (LOTRISONE) cream Apply 1 application topically 3 (three) times daily as needed.   Yes Historical Provider, MD  lisinopril (PRINIVIL,ZESTRIL) 10 MG tablet Take 1 tablet (10 mg total) by mouth daily. 01/20/15 02/07/17 Yes Alferd Apa Lowne, DO  SYNTHROID 137 MCG  tablet Take 1 tablet (137 mcg total) by mouth daily before breakfast. 01/20/15 04/26/17 Yes Yvonne R Lowne, DO  pramoxine (PROCTOFOAM) 1 %  foam Place 1 application rectally 3 (three) times daily as needed for itching. Patient not taking: Reported on 03/12/2015 01/19/15   Rosalita Chessman, DO   No Known Allergies  FAMILY HISTORY:  indicated that her mother is alive. She indicated that her father is alive. She indicated that her brother is alive.  SOCIAL HISTORY:  reports that she quit smoking about 15 years ago. Her smoking use included Cigarettes. She has a 25.5 pack-year smoking history. She does not have any smokeless tobacco history on file. She reports that she drinks alcohol. She reports that she does not use illicit drugs.  REVIEW OF SYSTEMS:   Gen: Denies fever, chills, weight change, fatigue, night sweats HEENT: Denies blurred vision, double vision, hearing loss, tinnitus, sinus congestion, rhinorrhea, sore throat, neck stiffness, dysphagia PULM:  Per HPI CV: Denies chest pain, edema, orthopnea, paroxysmal nocturnal dyspnea, palpitations GI: Denies abdominal pain, nausea, vomiting, diarrhea, hematochezia, melena, constipation, change in bowel habits GU: Denies dysuria, hematuria, polyuria, oliguria, urethral discharge Endocrine: Denies hot or cold intolerance, polyuria, polyphagia or appetite change Derm: Denies rash, dry skin, scaling or peeling skin change Heme: Denies easy bruising, bleeding, bleeding gums Neuro: Denies headache, numbness, weakness, slurred speech, loss of memory or consciousness   SUBJECTIVE:   VITAL SIGNS: Temp:  [98.4 F (36.9 C)-100.7 F (38.2 C)] 98.9 F (37.2 C) (10/16 0800) Pulse Rate:  [84-138] 89 (10/16 0600) Resp:  [14-32] 19 (10/16 0600) BP: (84-122)/(38-86) 106/57 mmHg (10/16 0600) SpO2:  [89 %-100 %] 99 % (10/16 0600) Weight:  [102.059 kg (225 lb)-107.1 kg (236 lb 1.8 oz)] 106.7 kg (235 lb 3.7 oz) (10/16 0400) HEMODYNAMICS:   VENTILATOR SETTINGS:   INTAKE / OUTPUT:  Intake/Output Summary (Last 24 hours) at 03/12/15 1148 Last data filed at 03/12/15 0400  Gross per 24 hour   Intake   4300 ml  Output    400 ml  Net   3900 ml    PHYSICAL EXAMINATION: General:  Sitting up in bed, no acute distress Neuro:  Awake alert, oriented 4, moving all 4 extremities HEENT:  Normocephalic atraumatic, oropharynx clear, extraocular movements intact Cardiovascular:  Regular rate and rhythm, slight systolic murmur, no JVD, no pedal edema Lungs:  Clear to auscultation on the right, few crackles and rhonchi with poor air movement on the left Abdomen:  Bowel sounds positive, nontender nondistended Musculoskeletal:  Normal bulk and tone Skin:  No rash or skin breakdown  LABS:  CBC  Recent Labs Lab 03/11/15 1730 03/12/15 0555  WBC 11.9* 10.9*  HGB 14.0 12.0  HCT 41.8 36.1  PLT 211 179   Coag's  Recent Labs Lab 03/12/15 0555  INR 1.11   BMET  Recent Labs Lab 03/11/15 1730 03/12/15 0555  NA 139 137  K 3.9 4.3  CL 102 104  CO2 32 25  BUN 11 9  CREATININE 0.85 0.79  GLUCOSE 142* 227*   Electrolytes  Recent Labs Lab 03/11/15 1730 03/12/15 0555  CALCIUM 9.3 8.0*   Sepsis Markers  Recent Labs Lab 03/11/15 2119 03/11/15 2154 03/12/15 0555  LATICACIDVEN 2.96* 2.88* 2.2*   ABG  Recent Labs Lab 03/11/15 2228  PHART 7.312*  PCO2ART 44.0  PO2ART 65.0*   Liver Enzymes  Recent Labs Lab 03/11/15 1730 03/12/15 0555  AST 89* 62*  ALT 69* 74*  ALKPHOS 91 73  BILITOT  0.9 0.4  ALBUMIN 4.2 3.4*   Cardiac Enzymes  Recent Labs Lab 03/11/15 1730 03/12/15 0010 03/12/15 0555  TROPONINI <0.03 <0.03 <0.03   Glucose No results for input(s): GLUCAP in the last 168 hours.  Imaging Dg Chest 2 View  03/11/2015  CLINICAL DATA:  Cough and shortness of breath for 2 days. Non-Hodgkin's lymphoma. EXAM: CHEST  2 VIEW COMPARISON:  None. FINDINGS: Heart size and mediastinal contours are within normal limits. Elevation of left hemidiaphragm noted. No evidence of pulmonary infiltrate or edema. No evidence of pneumothorax or pleural effusion. Surgical  clips noted in anterior mediastinum. IMPRESSION: Elevated left hemidiaphragm.  No active lung disease. Electronically Signed   By: Earle Gell M.D.   On: 03/11/2015 18:45   Ct Angio Chest Pe W/cm &/or Wo Cm  03/11/2015  CLINICAL DATA:  Cough with shortness of breath for 2 days. Upper chest pain with coughing. History of non-Hodgkin's lymphoma. EXAM: CT ANGIOGRAPHY CHEST WITH CONTRAST TECHNIQUE: Multidetector CT imaging of the chest was performed using the standard protocol during bolus administration of intravenous contrast. Multiplanar CT image reconstructions and MIPs were obtained to evaluate the vascular anatomy. CONTRAST:  180mL OMNIPAQUE IOHEXOL 350 MG/ML SOLN COMPARISON:  None. FINDINGS: Angiographic study: No evidence of a pulmonary embolus. Thoracic aorta is normal in caliber. No dissection. No atherosclerotic plaque. Thoracic inlet:  No mass or adenopathy. Mediastinum and hila: Low-attenuation soft tissue abuts the main pulmonary artery and contacts the ascending aorta and aortic arch, partly surrounding the innominate, left common carotid artery common origin. Irregular dense material consistent with contrast, or possibly dense calcification, projects along the left anterior margin of this soft tissue. The left break cephalic vein is markedly narrowed as it passes over the soft tissue. Contrast-enhanced venous blood extends through multiple chest wall collaterals and along the azygos hemi azygous system. No mediastinal or hilar adenopathy. No hilar masses. Heart normal in size and configuration. Lungs and pleura: Bronchovascular opacities noted in the medial left upper lobe, with some associated volume loss, and to a lesser degree in the medial left lower lobe superior segment and medial right upper lobe. This has the appearance of scarring from radiation therapy. There are calcified granuloma in the right upper lobe, lateral left upper lobe and left lower lobe. There is a 6 mm noncalcified nodule in  the right upper lobe, image 24, series 10. 2-3 mm nodule lies laterally in the right lower lobe. 4 mm noncalcified nodule lies in the medial right lower lobe, image 38, series 10. There is a focal area of ground-glass opacity in the peripheral right lower lobe centered on image 53. No pulmonary edema. No pleural effusion or pneumothorax. Limited upper abdomen: Fatty infiltration of the liver. Otherwise unremarkable. Musculoskeletal: No osteoblastic or osteolytic lesions. No significant degenerative change. Review of the MIP images confirms the above findings. IMPRESSION: 1. Abnormal anterior mediastinal soft tissue which abuts the main pulmonary artery and aortic arch. This causes significant narrowing of the left brachiocephalic vein, with contrast enhanced venous blood diverted into multiple collaterals. There is dense material within the left anterior aspect of this abnormal soft tissue which may reflect contrast or dense calcification. These findings may be from treated lymphoma. Recurrent lymphoma is possible. 2. No evidence of a pulmonary embolus. 3. No acute findings in the lungs. There is presumed radiation induced scarring along the medial aspect of the left and right upper lobes and superior segment left lower lobe. Several calcified granuloma with several other noncalcified nodules. Recommend  followup evaluation of the noncalcified nodules in 6-12 months to document stability. Electronically Signed   By: Lajean Manes M.D.   On: 03/11/2015 20:10     ASSESSMENT / PLAN:  PULMONARY  A: Acute hypoxemic respiratory failure in the setting of an upper respiratory infection with known diaphragm paralysis. Her diaphragm paralysis is presumably due to radiation therapy. Her CT scan has multiple abnormalities but none of which would explain her hypoxemia with the exception of the diaphragm paralysis. She has scattered nodules, a calcified granuloma, and some radiation fibrosis changes.  Presumably the  cause of her shortness of breath and hypoxemia is chest congestion from bronchitis with an inability to clear airway secretions due to her diaphragm paralysis. Her diaphragm paralysis symptoms have been chronic and are magnified by her obesity.  As far as her multiple pulmonary nodules and this questionable mediastinal mask as, I don't recommend any further imaging or biopsies until we can compare prior CTs which have been performed (she has had many). From her description it sounds as if she has known about these abnormalities for quite some time.  P:   Repeat 2 view chest x-ray in the morning Out of bed Incentive spirometry Flutter valve Increase guaifenesin Titrated to as needed to maintain O2 saturation greater than 92% Agree with ceftriaxone to treat acute bronchitis, but because this is viral we could probably stop that if she has improved by tomorrow   CARDIOVASCULAR A: Heart murmur, she says this is chronic P:  We'll follow-up echocardiogram result   HEMATOLOGIC A:  Non-Hodgkin's lymphoma in remission, no clear evidence of progression by history, we really need to see her old studies (CT scans, clinic notes) before any further workup P:  Per oncology  INFECTIOUS A:  Acute bronchitis, likely viral She does not have pneumonia P:   BCx2 03/11/2015> UC 03/11/2015>  Ceftriaxone 03/11/2015 > Azithromycin 03/11/2015 >    FAMILY  - Updates: husband updated at bedside, I requested that he bring in her previous records and CT scans    Roselie Awkward, MD [facility] Pager: [phone] Cell: [phone] After 3pm or if no response, call [phone]   03/12/2015, 11:48 AM

## 2015-03-12 NOTE — Progress Notes (Signed)
[facility] PROGRESS NOTE  Ashley Mckee YWV:[id number] DOB: 1966/02/21 DOA: 03/11/2015 PCP: Garnet Koyanagi, DO  Assessment/Plan: 1. Acute hypoxemic respiratory failure (HCC) ,  Acute bronchitis with bronchospasm. Probable chronic diastolic dysfunction, PNA; presents with dyspnea, leukocytolysis, mildly elevated lactic acid.  -Continue with Vancomycin, ceftriaxone , azithromycin treatment for PNA.  - Continue with Duo nebs, solumedrol.  -sputum culture, blood culture as well as influenza PCR pending.  - strep, legionella urine antigens pending .  -ECHO, ordered. Will order bnp  2.History of non-Hodgkin's lymphoma. Anterior mediastinal mass.,  -Oncologist consulted, CVTS was consulted.  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  Observe per oncologist.   Hypothyroidism Recent TSH was adequate. Continue Synthroid.  Transaminases; check hepatitis panel.   Hyperglycemia; on steroids. Check Hb A-1c. Will order SSI.   Code Status: Full Code.  Family Communication: Care discussed with patient  Disposition Plan: Remain in the step down unit.    Consultants:  Oncology  Procedures:  none  Antibiotics:  Vancomycin 10-15  Ceftriaxone 10-15  HPI/Subjective: She is feeling better, breathing better. She has long history of dyspnea, worse yesterday.  Coughing productive phlegm.    Objective: Filed Vitals:   03/12/15 0800  BP:   Pulse:   Temp: 98.9 F (37.2 C)  Resp:     Intake/Output Summary (Last 24 hours) at 03/12/15 0846 Last data filed at 03/12/15 0400  Gross per 24 hour  Intake   4300 ml  Output    400 ml  Net   3900 ml   Filed Weights   03/11/15 1751 03/11/15 2133 03/12/15 0400  Weight: 102.059 kg (225  lb) 107.1 kg (236 lb 1.8 oz) 106.7 kg (235 lb 3.7 oz)    Exam:   General: Alert in no distress  Cardiovascular: S 1, S 2 RRR  Respiratory: CTA  Abdomen: BS present, soft, nt  Musculoskeletal: no edema  Data Reviewed: Basic Metabolic Panel:  Recent Labs Lab 03/11/15 1730 03/12/15 0555  NA 139 137  K 3.9 4.3  CL 102 104  CO2 32 25  GLUCOSE 142* 227*  BUN 11 9  CREATININE 0.85 0.79  CALCIUM 9.3 8.0*   Liver Function Tests:  Recent Labs Lab 03/11/15 1730 03/12/15 0555  AST 89* 62*  ALT 69* 74*  ALKPHOS 91 73  BILITOT 0.9 0.4  PROT 7.7 6.7  ALBUMIN 4.2 3.4*   No results for input(s): LIPASE, AMYLASE in the last 168 hours. No results for input(s): AMMONIA in the last 168 hours. CBC:  Recent Labs Lab 03/11/15 1730 03/12/15 0555  WBC 11.9* 10.9*  NEUTROABS 10.5*  --   HGB 14.0 12.0  HCT 41.8 36.1  MCV 93.5 93.0  PLT 211 179   Cardiac Enzymes:  Recent Labs Lab 03/11/15 1730 03/12/15 0010 03/12/15 0555  TROPONINI <0.03 <0.03 <0.03   BNP (last 3 results)  Recent Labs  03/11/15 1740  BNP 95.7    ProBNP (last 3 results) No results for input(s): PROBNP in the last 8760 hours.  CBG: No results for input(s): GLUCAP in the last 168 hours.  Recent Results (from the past 240 hour(s))  MRSA PCR Screening     Status: None   Collection Time: 03/11/15 11:40 PM  Result Value Ref Range Status   MRSA by PCR NEGATIVE NEGATIVE Final  Comment:        The GeneXpert MRSA Assay (FDA approved for NASAL specimens only), is one component of a comprehensive MRSA colonization surveillance program. It is not intended to diagnose MRSA infection nor to guide or monitor treatment for MRSA infections.      Studies: Dg Chest 2 View  03/11/2015  CLINICAL DATA:  Cough and shortness of breath for 2 days. Non-Hodgkin's lymphoma. EXAM: CHEST  2 VIEW COMPARISON:  None. FINDINGS: Heart size and mediastinal contours are within normal limits. Elevation of left  hemidiaphragm noted. No evidence of pulmonary infiltrate or edema. No evidence of pneumothorax or pleural effusion. Surgical clips noted in anterior mediastinum. IMPRESSION: Elevated left hemidiaphragm.  No active lung disease. Electronically Signed   By: Earle Gell M.D.   On: 03/11/2015 18:45   Ct Angio Chest Pe W/cm &/or Wo Cm  03/11/2015  CLINICAL DATA:  Cough with shortness of breath for 2 days. Upper chest pain with coughing. History of non-Hodgkin's lymphoma. EXAM: CT ANGIOGRAPHY CHEST WITH CONTRAST TECHNIQUE: Multidetector CT imaging of the chest was performed using the standard protocol during bolus administration of intravenous contrast. Multiplanar CT image reconstructions and MIPs were obtained to evaluate the vascular anatomy. CONTRAST:  165mL OMNIPAQUE IOHEXOL 350 MG/ML SOLN COMPARISON:  None. FINDINGS: Angiographic study: No evidence of a pulmonary embolus. Thoracic aorta is normal in caliber. No dissection. No atherosclerotic plaque. Thoracic inlet:  No mass or adenopathy. Mediastinum and hila: Low-attenuation soft tissue abuts the main pulmonary artery and contacts the ascending aorta and aortic arch, partly surrounding the innominate, left common carotid artery common origin. Irregular dense material consistent with contrast, or possibly dense calcification, projects along the left anterior margin of this soft tissue. The left break cephalic vein is markedly narrowed as it passes over the soft tissue. Contrast-enhanced venous blood extends through multiple chest wall collaterals and along the azygos hemi azygous system. No mediastinal or hilar adenopathy. No hilar masses. Heart normal in size and configuration. Lungs and pleura: Bronchovascular opacities noted in the medial left upper lobe, with some associated volume loss, and to a lesser degree in the medial left lower lobe superior segment and medial right upper lobe. This has the appearance of scarring from radiation therapy. There are  calcified granuloma in the right upper lobe, lateral left upper lobe and left lower lobe. There is a 6 mm noncalcified nodule in the right upper lobe, image 24, series 10. 2-3 mm nodule lies laterally in the right lower lobe. 4 mm noncalcified nodule lies in the medial right lower lobe, image 38, series 10. There is a focal area of ground-glass opacity in the peripheral right lower lobe centered on image 53. No pulmonary edema. No pleural effusion or pneumothorax. Limited upper abdomen: Fatty infiltration of the liver. Otherwise unremarkable. Musculoskeletal: No osteoblastic or osteolytic lesions. No significant degenerative change. Review of the MIP images confirms the above findings. IMPRESSION: 1. Abnormal anterior mediastinal soft tissue which abuts the main pulmonary artery and aortic arch. This causes significant narrowing of the left brachiocephalic vein, with contrast enhanced venous blood diverted into multiple collaterals. There is dense material within the left anterior aspect of this abnormal soft tissue which may reflect contrast or dense calcification. These findings may be from treated lymphoma. Recurrent lymphoma is possible. 2. No evidence of a pulmonary embolus. 3. No acute findings in the lungs. There is presumed radiation induced scarring along the medial aspect of the left and right upper lobes and superior segment left  lower lobe. Several calcified granuloma with several other noncalcified nodules. Recommend followup evaluation of the noncalcified nodules in 6-12 months to document stability. Electronically Signed   By: Lajean Manes M.D.   On: 03/11/2015 20:10    Scheduled Meds: . azithromycin  500 mg Intravenous QHS  . cefTRIAXone (ROCEPHIN)  IV  2 g Intravenous QHS  . guaiFENesin  600 mg Oral BID  . ipratropium-albuterol  3 mL Nebulization Q6H  . levothyroxine  137 mcg Oral QAC breakfast  . methylPREDNISolone (SOLU-MEDROL) injection  60 mg Intravenous Q12H  . sodium chloride  250  mL Intravenous Once  . sodium chloride  3 mL Intravenous Q12H  . vancomycin  1,000 mg Intravenous Q12H   Continuous Infusions:   Principal Problem:   Acute hypoxemic respiratory failure (HCC) Active Problems:   Hypothyroidism   Diaphragm paralysis   Mediastinal mass   NHL (non-Hodgkin's lymphoma) ([place]), treated with chemo X2 and radiation    Time spent: 35 minutes.     Niel Hummer A  [facility] Pager [phone]. If 7PM-7AM, please contact night-coverage at [email or web address], password [email or web address] 03/12/2015, 8:46 AM  LOS: 1 day

## 2015-03-12 NOTE — Progress Notes (Signed)
[place] CONSULT NOTE  Patient Care Team: Ashley Chessman, DO as PCP - General (Family Medicine)  CHIEF COMPLAINTS/PURPOSE OF CONSULTATION:  History of lymphoma, admitted for significant shortness of breath, hypoxemia, fever with possible pneumonia and abnormal CT scan  HISTORY OF PRESENTING ILLNESS:  Ashley Mckee 49 y.o. female has extensive background history of non-Hodgkin lymphoma status post chemotherapy, radiation therapy and bone marrow transplant. She is an excellent historian. In the year 2000, during pregnancy, she was found to have mediastinal mass causing shortness of breath. We do not have outside records but per patient, she was diagnosed with non-Hodgkin lymphoma. 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 She received several rounds of chemotherapy followed by consolidation treatment with radiation. Her daughter was born after chemotherapy was completed. Subsequently, she was found to have residual disease despite aggressive treatment and she was subsequently referred for high-dose chemotherapy followed by autologous stem cell transplant. She had her transplant at [facility]. The patient completed her posttransplant vaccination series but denies further vaccination in the past few years. 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 She also was known to have paralysis of the left hemidiaphragm. She has a copy of her CT scan dated 7-8 years ago at home and I have requested her husband to bring in. In this hospitalization, she complained of worsening shortness of breath, some mild nonproductive cough and fever. At baseline, the patient typically sleeps with several pillows due to orthopnea.  Over the past 3-4 days, she had progressive shortness of breath. 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 During the interview, the patient is quite short of breath. However, she felt better since admission with oxygen therapy and IV antibiotics.  MEDICAL HISTORY:  Past Medical History  Diagnosis Date  . Thyroid disease   . Heart murmur   . CHF (congestive heart failure) (HCC)     Last EF was >50%   . 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    . Non Hodgkin's lymphoma ([place]) 2000    Non Hodgkin's Lymphoma (Bone Marrow Transplant)  - [place]  . 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 Tubal ligation    . Lymph node biopsy    . Bone marrow transplant      SOCIAL HISTORY: Social History   Social History  . Marital Status: Married    Spouse Name: Ashley Mckee   . Number of Children: 3  . Years of Education: 12   Occupational History  . CASHIER  Other    [employer]    Social History Main Topics  . Smoking status: Former Smoker -- 1.50 packs/day for 17 years    Types: Cigarettes    Quit date: 05/28/1999  . Smokeless tobacco: Not on file  . Alcohol Use: Yes     Comment: Rarely   . Drug Use: No  . Sexual Activity:    Partners: Male   Other Topics Concern  . Not on file   Social History Narrative   Marital Status:  Married Ashley Mckee)   Children:  Daughter (3)    Pets: Dog (2) Cat (1)    Living Situation: Lives with husband  and kids    Occupation: Risk manager)     Education: Environmental education officer    Tobacco Use/Exposure:  Former Smoker    Alcohol Use:  Rarely    Drug Use:  None   Diet:  Regular   Exercise:  None   Hobbies:  Walking              FAMILY HISTORY: Family History  Problem Relation Age of Onset  . Hypertension Mother   . Diabetes Father   . Hypertension Father   . Stomach cancer Maternal Grandmother     ALLERGIES:  has No Known  Allergies.  MEDICATIONS:  Current Facility-Administered Medications  Medication Dose Route Frequency Provider Last Rate Last Dose  . acetaminophen (TYLENOL) tablet 650 mg  650 mg Oral Q6H PRN Lavina Hamman, MD       Or  . acetaminophen (TYLENOL) suppository 650 mg  650 mg Rectal Q6H PRN Lavina Hamman, MD      . azithromycin (ZITHROMAX) 500 mg in dextrose 5 % 250 mL IVPB  500 mg Intravenous QHS Lavina Hamman, MD   500 mg at 03/12/15 0035  . cefTRIAXone (ROCEPHIN) 2 g in dextrose 5 % 50 mL IVPB  2 g Intravenous QHS Lavina Hamman, MD   2 g at 03/12/15 0035  . guaiFENesin (MUCINEX) 12 hr tablet 600 mg  600 mg Oral BID Lavina Hamman, MD   600 mg at 03/12/15 0036  . ipratropium-albuterol (DUONEB) 0.5-2.5 (3) MG/3ML nebulizer solution 3 mL  3 mL Nebulization Q6H Lavina Hamman, MD   3 mL at 03/12/15 0111  . levothyroxine (SYNTHROID, LEVOTHROID) tablet 137 mcg  137 mcg Oral QAC breakfast Lavina Hamman, MD      . methylPREDNISolone sodium succinate (SOLU-MEDROL) 125 mg/2 mL injection 60 mg  60 mg Intravenous Q12H Lavina Hamman, MD   60 mg at 03/12/15 0604  . ondansetron (ZOFRAN) tablet 4 mg  4 mg Oral Q6H PRN Lavina Hamman, MD       Or  . ondansetron [facility]) injection 4 mg  4 mg Intravenous Q6H PRN Lavina Hamman, MD      . sodium chloride 0.9 % bolus 250 mL  250 mL Intravenous Once Dianne Dun, NP      . sodium chloride 0.9 % injection 3 mL  3 mL Intravenous Q12H Lavina Hamman, MD   3 mL at 03/12/15 0054  . vancomycin (VANCOCIN) IVPB 1000 mg/200 mL premix  1,000 mg Intravenous Q12H Lavina Hamman, MD        REVIEW OF SYSTEMS:   Constitutional: Denies fevers, chills or abnormal night sweats Eyes: Denies blurriness of vision, double vision or watery eyes Ears, nose, mouth, throat, and face: Denies mucositis or sore throat Cardiovascular: Denies palpitation, chest discomfort or lower extremity swelling Gastrointestinal:  Denies nausea, heartburn or change in bowel habits Skin:  Denies abnormal skin rashes Lymphatics: Denies new lymphadenopathy or easy bruising Neurological:Denies numbness, tingling or new weaknesses Behavioral/Psych: Mood is stable, no new changes  All other systems were reviewed with the patient and are negative.  PHYSICAL EXAMINATION: ECOG PERFORMANCE STATUS: 1 - Symptomatic but completely ambulatory  Filed Vitals:   03/12/15 0600  BP: 106/57  Pulse: 89  Temp:   Resp: 19   Filed Weights   03/11/15 1751 03/11/15 2133 03/12/15 0400  Weight: 225 lb (102.059 kg) 236 lb 1.8 oz (107.1 kg) 235 lb 3.7 oz (106.7 kg)  GENERAL:alert, no distress and comfortable. The patient is obese SKIN: skin color, texture, turgor are normal, no rashes or significant lesions EYES: normal, conjunctiva are pink and non-injected, sclera clear OROPHARYNX:no exudate, no erythema and lips, buccal mucosa, and tongue normal  NECK: supple, thyroid normal size, non-tender, without nodularity LYMPH:  no palpable lymphadenopathy in the cervical, axillary or inguinal LUNGS: Noted increased work of breathing. Reduced breath sound in the left lung base. No wheezes or crackles. HEART: regular rate & rhythm and no murmurs and no lower extremity edema ABDOMEN:abdomen soft, non-tender and normal bowel sounds Musculoskeletal:no cyanosis of digits and no clubbing  PSYCH: alert & oriented x 3 with fluent speech NEURO: no focal motor/sensory deficits  LABORATORY DATA:  I have reviewed the data as listed Lab Results  Component Value Date   WBC 10.9* 03/12/2015   HGB 12.0 03/12/2015   HCT 36.1 03/12/2015   MCV 93.0 03/12/2015   PLT 179 03/12/2015    Recent Labs  01/19/15 1449 03/11/15 1730 03/12/15 0555  NA 138 139 137  K 4.0 3.9 4.3  CL 100 102 104  CO2 31 32 25  GLUCOSE 76 142* 227*  BUN 13 11 9   CREATININE 0.82 0.85 0.79  CALCIUM 9.8 9.3 8.0*  GFRNONAA  --  >60 >60  GFRAA  --  >60 >60  PROT 7.5 7.7 6.7  ALBUMIN 4.2 4.2 3.4*  AST 19 89* 62*  ALT 23 69*  74*  ALKPHOS 75 91 73  BILITOT 0.6 0.9 0.4    RADIOGRAPHIC STUDIES: I have personally reviewed the radiological images as listed and agreed with the findings in the report. Dg Chest 2 View  03/11/2015  CLINICAL DATA:  Cough and shortness of breath for 2 days. Non-Hodgkin's lymphoma. EXAM: CHEST  2 VIEW COMPARISON:  None. FINDINGS: Heart size and mediastinal contours are within normal limits. Elevation of left hemidiaphragm noted. No evidence of pulmonary infiltrate or edema. No evidence of pneumothorax or pleural effusion. Surgical clips noted in anterior mediastinum. IMPRESSION: Elevated left hemidiaphragm.  No active lung disease. Electronically Signed   By: Earle Gell M.D.   On: 03/11/2015 18:45   Ct Angio Chest Pe W/cm &/or Wo Cm  03/11/2015  CLINICAL DATA:  Cough with shortness of breath for 2 days. Upper chest pain with coughing. History of non-Hodgkin's lymphoma. EXAM: CT ANGIOGRAPHY CHEST WITH CONTRAST TECHNIQUE: Multidetector CT imaging of the chest was performed using the standard protocol during bolus administration of intravenous contrast. Multiplanar CT image reconstructions and MIPs were obtained to evaluate the vascular anatomy. CONTRAST:  174mL OMNIPAQUE IOHEXOL 350 MG/ML SOLN COMPARISON:  None. FINDINGS: Angiographic study: No evidence of a pulmonary embolus. Thoracic aorta is normal in caliber. No dissection. No atherosclerotic plaque. Thoracic inlet:  No mass or adenopathy. Mediastinum and hila: Low-attenuation soft tissue abuts the main pulmonary artery and contacts the ascending aorta and aortic arch, partly surrounding the innominate, left common carotid artery common origin. Irregular dense material consistent with contrast, or possibly dense calcification, projects along the left anterior margin of this soft tissue. The left break cephalic vein is markedly narrowed as it passes over the soft tissue. Contrast-enhanced venous blood extends through multiple chest wall collaterals  and along the azygos hemi azygous system. No mediastinal or hilar adenopathy. No hilar masses. Heart normal in size and configuration. Lungs and pleura: Bronchovascular opacities noted in the medial left upper lobe, with some associated volume loss, and to a lesser degree in the medial left lower lobe  superior segment and medial right upper lobe. This has the appearance of scarring from radiation therapy. There are calcified granuloma in the right upper lobe, lateral left upper lobe and left lower lobe. There is a 6 mm noncalcified nodule in the right upper lobe, image 24, series 10. 2-3 mm nodule lies laterally in the right lower lobe. 4 mm noncalcified nodule lies in the medial right lower lobe, image 38, series 10. There is a focal area of ground-glass opacity in the peripheral right lower lobe centered on image 53. No pulmonary edema. No pleural effusion or pneumothorax. Limited upper abdomen: Fatty infiltration of the liver. Otherwise unremarkable. Musculoskeletal: No osteoblastic or osteolytic lesions. No significant degenerative change. Review of the MIP images confirms the above findings. IMPRESSION: 1. Abnormal anterior mediastinal soft tissue which abuts the main pulmonary artery and aortic arch. This causes significant narrowing of the left brachiocephalic vein, with contrast enhanced venous blood diverted into multiple collaterals. There is dense material within the left anterior aspect of this abnormal soft tissue which may reflect contrast or dense calcification. These findings may be from treated lymphoma. Recurrent lymphoma is possible. 2. No evidence of a pulmonary embolus. 3. No acute findings in the lungs. There is presumed radiation induced scarring along the medial aspect of the left and right upper lobes and superior segment left lower lobe. Several calcified granuloma with several other noncalcified nodules. Recommend followup evaluation of the noncalcified nodules in 6-12 months to document  stability. Electronically Signed   By: Lajean Manes M.D.   On: 03/11/2015 20:10    ASSESSMENT & PLAN:  History of non-Hodgkin lymphoma I suspect the patient may have diagnosis of primary mediastinal B-cell lymphoma. We will obtain outside records tomorrow. The patient has a copy of old CT scan of the chest at home. I asked her husband to bring into that we can upload in the computer and compare those CT scans. I do not believe the mild soft tissue thickening behind the retro-sternal area is a cause of her shortness of breath The patient will benefit from PET CT scan as an outpatient to evaluate further  Known brachiocephalic venous stenosis This is a well-known complication from prior treatment. A lot of collaterals are seen on CT scan Observe only.  Increased work of breathing with shortness of breath and mild hypoxemia Possible community-acquired pneumonia Known left diaphragmatic paralysis I will consult pulmonologist for evaluation.  She is currently on broad-spectrum IV antibiotics and oxygen therapy  S/p BMT The patient will benefit from pneumococcal vaccination after she recovers from current hospitalization. I can arrange for that in the outpatient  Will follow  All questions were answered. The patient knows to call the clinic with any problems, questions or concerns.   [place], [place], MD 03/12/2015 8:02 AM

## 2015-03-12 NOTE — Progress Notes (Signed)
  Echocardiogram 2D Echocardiogram has been performed.  Lysle Rubens 03/12/2015, 10:15 AM

## 2015-03-12 NOTE — Progress Notes (Signed)
ANTIBIOTIC CONSULT NOTE - INITIAL  Pharmacy Consult for Vancomycin, ceftriaxone Indication: acute hypoxia with bronchitis  No Known Allergies  Patient Measurements: Height: 5\' 5"  (165.1 cm) Weight: 225 lb (102.059 kg) IBW/kg (Calculated) : 57 Adjusted Body Weight:   Vital Signs: Temp: 98.9 F (37.2 C) (10/15 2133) Temp Source: Oral (10/15 2133) BP: 108/62 mmHg (10/15 2233) Pulse Rate: 96 (10/15 2233) Intake/Output from previous day: 10/15 0701 - 10/16 0700 In: 4300 [IV Piggyback:4300] Out: -  Intake/Output from this shift: Total I/O In: 4300 [IV Piggyback:4300] Out: -   Labs:  Recent Labs  03/11/15 1730  WBC 11.9*  HGB 14.0  PLT 211  CREATININE 0.85   Estimated Creatinine Clearance: 96.9 mL/min (by C-G formula based on Cr of 0.85). No results for input(s): VANCOTROUGH, VANCOPEAK, VANCORANDOM, GENTTROUGH, GENTPEAK, GENTRANDOM, TOBRATROUGH, TOBRAPEAK, TOBRARND, AMIKACINPEAK, AMIKACINTROU, AMIKACIN in the last 72 hours.   Microbiology: No results found for this or any previous visit (from the past 720 hour(s)).  Medical History: Past Medical History  Diagnosis Date  . Thyroid disease   . Heart murmur   . CHF (congestive heart failure) (HCC)     Last EF was >50%   . 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    . Non Hodgkin's lymphoma ([place]) 2000    Non Hodgkin's Lymphoma (Bone Marrow Transplant)  - [place]  . 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    Medications:  Anti-infectives    Start     Dose/Rate Route Frequency Ordered Stop   03/12/15 0900  vancomycin (VANCOCIN) IVPB 1000 mg/200 mL premix  Status:  Discontinued     1,000 mg 200 mL/hr over 60 Minutes Intravenous Every 12 hours 03/11/15 2038 03/11/15 2350   03/12/15 0800  vancomycin (VANCOCIN) IVPB 1000 mg/200 mL premix     1,000 mg 200 mL/hr over 60 Minutes Intravenous Every 12 hours 03/11/15 2359     03/12/15  0500  piperacillin-tazobactam (ZOSYN) IVPB 3.375 g  Status:  Discontinued     3.375 g 12.5 mL/hr over 240 Minutes Intravenous Every 8 hours 03/11/15 2038 03/11/15 2350   03/12/15 0015  cefTRIAXone (ROCEPHIN) 2 g in dextrose 5 % 50 mL IVPB     2 g 100 mL/hr over 30 Minutes Intravenous Daily at bedtime 03/12/15 0001     03/12/15 0000  azithromycin (ZITHROMAX) 500 mg in dextrose 5 % 250 mL IVPB     500 mg 250 mL/hr over 60 Minutes Intravenous Daily at bedtime 03/11/15 2350     03/12/15 0000  cefTRIAXone (ROCEPHIN) 2 g in dextrose 5 % 50 mL IVPB  Status:  Discontinued     2 g 100 mL/hr over 30 Minutes Intravenous Every 12 hours 03/11/15 2359 03/12/15 0001   03/11/15 2230  oseltamivir (TAMIFLU) capsule 75 mg  Status:  Discontinued     75 mg Oral  Once 03/11/15 2216 03/11/15 2217   03/11/15 2045  piperacillin-tazobactam (ZOSYN) IVPB 3.375 g     3.375 g 100 mL/hr over 30 Minutes Intravenous  Once 03/11/15 2031 03/11/15 2110   03/11/15 2045  vancomycin (VANCOCIN) IVPB 1000 mg/200 mL premix     1,000 mg 200 mL/hr over 60 Minutes Intravenous  Once 03/11/15 2031 03/11/15 2145     Assessment: Patient with acute hypoxia with bronchitis.    Goal of Therapy:  Vancomycin trough level 15-20 mcg/ml  Rocephin based on manufacturer dosing recommendations.  Plan:  Measure antibiotic drug levels at steady state Follow up culture results Vancomycin 1gm iv q12hr  Ceftriaxone 2gm iv q24hr  Tyler Deis, Shea Stakes [place] 03/12/2015,12:02 AM

## 2015-03-12 NOTE — Progress Notes (Signed)
Droplet precautions discontinued. Influenza viral panel negative. Spoke with infection control specialist on call, who said it was OK to dc precautions. Madelin Headings.

## 2015-03-13 ENCOUNTER — Telehealth: Payer: Self-pay | Admitting: Hematology and Oncology

## 2015-03-13 ENCOUNTER — Encounter: Payer: Self-pay | Admitting: *Deleted

## 2015-03-13 ENCOUNTER — Inpatient Hospital Stay (HOSPITAL_COMMUNITY): Payer: 59

## 2015-03-13 DIAGNOSIS — J9601 Acute respiratory failure with hypoxia: Secondary | ICD-10-CM | POA: Insufficient documentation

## 2015-03-13 LAB — URINE CULTURE

## 2015-03-13 LAB — COMPREHENSIVE METABOLIC PANEL
ALBUMIN: 3.5 g/dL (ref 3.5–5.0)
ALT: 157 U/L — ABNORMAL HIGH (ref 14–54)
ANION GAP: 8 (ref 5–15)
AST: 122 U/L — ABNORMAL HIGH (ref 15–41)
Alkaline Phosphatase: 77 U/L (ref 38–126)
BUN: 16 mg/dL (ref 6–20)
CALCIUM: 8.6 mg/dL — AB (ref 8.9–10.3)
CHLORIDE: 105 mmol/L (ref 101–111)
CO2: 25 mmol/L (ref 22–32)
Creatinine, Ser: 0.79 mg/dL (ref 0.44–1.00)
GFR calc non Af Amer: >60 mL/min (ref >60)
GLUCOSE: 224 mg/dL — AB (ref 65–99)
POTASSIUM: 4.1 mmol/L (ref 3.5–5.1)
SODIUM: 138 mmol/L (ref 135–145)
Total Bilirubin: 0.5 mg/dL (ref 0.3–1.2)
Total Protein: 6.9 g/dL (ref 6.5–8.1)

## 2015-03-13 LAB — GLUCOSE, CAPILLARY
GLUCOSE-CAPILLARY: 253 mg/dL — AB (ref 65–99)
Glucose-Capillary: 221 mg/dL — ABNORMAL HIGH (ref 65–99)

## 2015-03-13 LAB — HEPATITIS PANEL, ACUTE
HCV Ab: <0.1 s/co ratio (ref 0.0–0.9)
HEP A IGM: NEGATIVE
Hep B C IgM: NEGATIVE
Hepatitis B Surface Ag: NEGATIVE

## 2015-03-13 LAB — CBC
HCT: 36.6 % (ref 36.0–46.0)
HEMOGLOBIN: 12.3 g/dL (ref 12.0–15.0)
MCH: 31.4 pg (ref 26.0–34.0)
MCHC: 33.6 g/dL (ref 30.0–36.0)
MCV: 93.4 fL (ref 78.0–100.0)
PLATELETS: 209 10*3/uL (ref 150–400)
RBC: 3.92 MIL/uL (ref 3.87–5.11)
RDW: 14 % (ref 11.5–15.5)
WBC: 17.9 10*3/uL — ABNORMAL HIGH (ref 4.0–10.5)

## 2015-03-13 LAB — HEMOGLOBIN A1C
Hgb A1c MFr Bld: 6.1 % — ABNORMAL HIGH (ref 4.8–5.6)
MEAN PLASMA GLUCOSE: 128 mg/dL

## 2015-03-13 LAB — LEGIONELLA PNEUMOPHILA SEROGP 1 UR AG: L. PNEUMOPHILA SEROGP 1 UR AG: NEGATIVE

## 2015-03-13 MED ORDER — AZITHROMYCIN 500 MG PO TABS
500.0000 mg | ORAL_TABLET | Freq: Every day | ORAL | Status: DC
Start: 2015-03-13 — End: 2015-03-15

## 2015-03-13 MED ORDER — GUAIFENESIN ER 600 MG PO TB12: 1200.0000 mg | ORAL_TABLET | Freq: Two times a day (BID) | ORAL | Status: DC

## 2015-03-13 NOTE — Discharge Summary (Signed)
Physician Discharge Summary  Ashley Mckee LFY:[id number] DOB: 12/04/1965 DOA: 03/11/2015  PCP: Garnet Koyanagi, DO  Admit date: 03/11/2015 Discharge date: 03/13/2015  Time spent: 35 minutes  Recommendations for Outpatient Follow-up:  Follow up with oncologist for further evaluation of lymphoma.  Follow up with pulmonary Hepatitis panel pending.   Discharge Diagnoses:    Acute hypoxemic respiratory failure (HCC)   Hypothyroidism   Diaphragm paralysis   Mediastinal mass   NHL (non-Hodgkin's lymphoma) ([place]), treated with chemo X2 and radiation   Acute respiratory failure with hypoxemia [facility])   Discharge Condition: Stable  Diet recommendation: Carb Modified  Filed Weights   03/12/15 0400 03/12/15 1425 03/13/15 0524  Weight: 106.7 kg (235 lb 3.7 oz) 106.7 kg (235 lb 3.7 oz) 106.278 kg (234 lb 4.8 oz)    History of present illness:  Ashley Mckee is a 49 y.o. female with Past medical history of non-Hodgkin's lymphoma status post chemotherapy 2 as well as radiation therapy treated in [place], diastolic CHF, hypothyroidism, history of autologous bone marrow transplantation 2001. The patient is presenting with numbness of one day history of dry cough as well as shortness of breath. Patient also complains of pleuritic chest pain. Patient did have some fever as well as chills. Patient mentions at work she had one for coworker having similar respiratory symptoms. She mentions that her baseline she does have some orthopnea. But recently her breathing has progressively worsened last night and she couldn't sleep.  Hospital Course:  1. Acute hypoxemic respiratory failure (HCC) , Acute bronchitis with bronchospasm. Probable chronic diastolic dysfunction, PNA; presents with dyspnea, leukocytolysis, mildly elevated lactic acid.  -was cover with Vancomycin, ceftriaxone , azithromycin treatment for PNA. she will be discharge on 3 more days of azithro.  - Continue with Duo nebs.  -sputum culture, blood  culture as well as influenza PCR negative - strep negative, legionella urine antigens pending .  -ECHO, normal Ef, no diastolic dysfunction.  Leukocytosis related to steroids.  Discharge today if able to ambulate and no hypoxemia  2.History of non-Hodgkin's lymphoma. Anterior mediastinal mass.,  -Oncologist consulted, CVTS was consulted.  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  Observe per oncologist.   Hypothyroidism Recent TSH was adequate. Continue Synthroid.  Transaminases hepatitis panel pending.   Hyperglycemia; on steroids.  Hb A-1c 6.1 suspect Blood sugar will decreased after stoping steroids. Diabetes educator for diet recommendation consulted.   Procedures:  ECHO; normal EF  Consultations:  Oncology  Pulmonary   Discharge Exam: Filed Vitals:   03/13/15 0524  BP: 96/57  Pulse: 81  Temp: 97.3 F (36.3 C)  Resp: 18    General: NAD Cardiovascular: S 1, S 2 RRR Respiratory: crakles bases  Discharge Instructions   Discharge Instructions    Diet Carb Modified    Complete by:  As directed      Increase activity slowly    Complete by:  As directed           Current Discharge Medication List    START taking these medications   Details  azithromycin (ZITHROMAX) 500 MG tablet Take 1 tablet (500 mg total) by mouth daily. Qty: 2 tablet, Refills: 0    guaiFENesin (MUCINEX) 600 MG 12 hr tablet Take 2 tablets (1,200 mg total) by mouth 2 (two) times daily. Qty: 30  tablet, Refills: 0      CONTINUE these medications which have NOT CHANGED   Details  albuterol (PROVENTIL HFA;VENTOLIN HFA) 108 (90 BASE) MCG/ACT inhaler Inhale 1 puff into the lungs every 6 (six) hours as needed for wheezing or shortness of  breath.    clotrimazole-betamethasone (LOTRISONE) cream Apply 1 application topically 3 (three) times daily as needed.    SYNTHROID 137 MCG tablet Take 1 tablet (137 mcg total) by mouth daily before breakfast. Qty: 90 tablet, Refills: 3   Associated Diagnoses: Hypothyroidism, unspecified hypothyroidism type      STOP taking these medications     lisinopril (PRINIVIL,ZESTRIL) 10 MG tablet      pramoxine (PROCTOFOAM) 1 % foam        No Known Allergies Follow-up Information    Call Simonne Maffucci, MD.   Specialty:  Pulmonary Disease   Contact information:   [place] [address] [phone]        The results of significant diagnostics from this hospitalization (including imaging, microbiology, ancillary and laboratory) are listed below for reference.    Significant Diagnostic Studies: Dg Chest 2 View  03/13/2015  CLINICAL DATA:  Shortness breath.  Cough.  Paralyzed diaphragm. EXAM: CHEST - 2 VIEW COMPARISON:  Two-view chest x-ray 03/11/2015. FINDINGS: The heart is upper limits of normal for size. The left hemidiaphragm remains elevated. A small left pleural effusion persists. Left basilar atelectasis is noted. The upper lung fields are clear. A 4 mm granuloma is again seen in the left upper lobe. The visualized soft tissues and bony thorax are unremarkable. IMPRESSION: 1. Stable elevation of the left hemidiaphragm. 2. Small left pleural effusion. 3. Left basilar airspace disease likely reflects atelectasis. Electronically Signed   By: San Morelle M.D.   On: 03/13/2015 08:45   Dg Chest 2 View  03/11/2015  CLINICAL DATA:  Cough and shortness of breath for 2 days. Non-Hodgkin's lymphoma. EXAM: CHEST  2 VIEW COMPARISON:  None. FINDINGS: Heart size and mediastinal contours are within normal limits. Elevation of left hemidiaphragm noted. No evidence of pulmonary infiltrate or edema. No evidence of pneumothorax or pleural effusion. Surgical clips noted in anterior  mediastinum. IMPRESSION: Elevated left hemidiaphragm.  No active lung disease. Electronically Signed   By: Earle Gell M.D.   On: 03/11/2015 18:45   Ct Angio Chest Pe W/cm &/or Wo Cm  03/11/2015  CLINICAL DATA:  Cough with shortness of breath for 2 days. Upper chest pain with coughing. History of non-Hodgkin's lymphoma. EXAM: CT ANGIOGRAPHY CHEST WITH CONTRAST TECHNIQUE: Multidetector CT imaging of the chest was performed using the standard protocol during bolus administration of intravenous contrast. Multiplanar CT image reconstructions and MIPs were obtained to evaluate the vascular anatomy. CONTRAST:  116mL OMNIPAQUE IOHEXOL 350 MG/ML SOLN COMPARISON:  None. FINDINGS: Angiographic study: No evidence of a pulmonary embolus. Thoracic aorta is normal in caliber. No dissection. No atherosclerotic plaque. Thoracic inlet:  No mass or adenopathy. Mediastinum and hila: Low-attenuation soft tissue abuts the main pulmonary artery and contacts the ascending aorta and aortic arch, partly surrounding the innominate, left common carotid artery common origin. Irregular dense material consistent with contrast, or possibly dense calcification, projects along the left anterior margin of this soft tissue. The left break cephalic vein is markedly narrowed as it passes over the soft tissue. Contrast-enhanced venous blood extends through multiple chest wall collaterals and along the azygos hemi azygous system. No mediastinal or hilar adenopathy. No hilar masses. Heart normal in size and configuration.  Lungs and pleura: Bronchovascular opacities noted in the medial left upper lobe, with some associated volume loss, and to a lesser degree in the medial left lower lobe superior segment and medial right upper lobe. This has the appearance of scarring from radiation therapy. There are calcified granuloma in the right upper lobe, lateral left upper lobe and left lower lobe. There is a 6 mm noncalcified nodule in the right upper lobe,  image 24, series 10. 2-3 mm nodule lies laterally in the right lower lobe. 4 mm noncalcified nodule lies in the medial right lower lobe, image 38, series 10. There is a focal area of ground-glass opacity in the peripheral right lower lobe centered on image 53. No pulmonary edema. No pleural effusion or pneumothorax. Limited upper abdomen: Fatty infiltration of the liver. Otherwise unremarkable. Musculoskeletal: No osteoblastic or osteolytic lesions. No significant degenerative change. Review of the MIP images confirms the above findings. IMPRESSION: 1. Abnormal anterior mediastinal soft tissue which abuts the main pulmonary artery and aortic arch. This causes significant narrowing of the left brachiocephalic vein, with contrast enhanced venous blood diverted into multiple collaterals. There is dense material within the left anterior aspect of this abnormal soft tissue which may reflect contrast or dense calcification. These findings may be from treated lymphoma. Recurrent lymphoma is possible. 2. No evidence of a pulmonary embolus. 3. No acute findings in the lungs. There is presumed radiation induced scarring along the medial aspect of the left and right upper lobes and superior segment left lower lobe. Several calcified granuloma with several other noncalcified nodules. Recommend followup evaluation of the noncalcified nodules in 6-12 months to document stability. Electronically Signed   By: Lajean Manes M.D.   On: 03/11/2015 20:10    Microbiology: Recent Results (from the past 240 hour(s))  Blood Culture (routine x 2)     Status: None (Preliminary result)   Collection Time: 03/11/15  5:35 PM  Result Value Ref Range Status   Specimen Description BLOOD RIGHT ARM  Final   Special Requests BOTTLES DRAWN AEROBIC AND ANAEROBIC 5CC EACH  Final   Culture   Final    NO GROWTH < 24 HOURS Performed at [facility]    Report Status PENDING  Incomplete  Blood Culture (routine x 2)     Status: None  (Preliminary result)   Collection Time: 03/11/15  5:35 PM  Result Value Ref Range Status   Specimen Description BLOOD LEFT ARM  Final   Special Requests BOTTLES DRAWN AEROBIC AND ANAEROBIC 10CC EACH  Final   Culture   Final    NO GROWTH < 24 HOURS Performed at [facility]    Report Status PENDING  Incomplete  Urine culture     Status: None   Collection Time: 03/11/15  8:08 PM  Result Value Ref Range Status   Specimen Description URINE, CLEAN CATCH  Final   Special Requests NONE  Final   Culture   Final    MULTIPLE SPECIES PRESENT, SUGGEST RECOLLECTION Performed at [facility]    Report Status 03/13/2015 FINAL  Final  MRSA PCR Screening     Status: None   Collection Time: 03/11/15 11:40 PM  Result Value Ref Range Status   MRSA by PCR NEGATIVE NEGATIVE Final    Comment:        The GeneXpert MRSA Assay (FDA approved for NASAL specimens only), is one component of a comprehensive MRSA colonization surveillance program. It is not intended to diagnose MRSA infection nor  to guide or monitor treatment for MRSA infections.      Labs: Basic Metabolic Panel:  Recent Labs Lab 03/11/15 1730 03/12/15 0555 03/13/15 0416  NA 139 137 138  K 3.9 4.3 4.1  CL 102 104 105  CO2 32 25 25  GLUCOSE 142* 227* 224*  BUN 11 9 16   CREATININE 0.85 0.79 0.79  CALCIUM 9.3 8.0* 8.6*   Liver Function Tests:  Recent Labs Lab 03/11/15 1730 03/12/15 0555 03/13/15 0416  AST 89* 62* 122*  ALT 69* 74* 157*  ALKPHOS 91 73 77  BILITOT 0.9 0.4 0.5  PROT 7.7 6.7 6.9  ALBUMIN 4.2 3.4* 3.5   No results for input(s): LIPASE, AMYLASE in the last 168 hours. No results for input(s): AMMONIA in the last 168 hours. CBC:  Recent Labs Lab 03/11/15 1730 03/12/15 0555 03/13/15 0416  WBC 11.9* 10.9* 17.9*  NEUTROABS 10.5*  --   --   HGB 14.0 12.0 12.3  HCT 41.8 36.1 36.6  MCV 93.5 93.0 93.4  PLT 211 179 209   Cardiac Enzymes:  Recent Labs Lab 03/11/15 1730 03/12/15 0010  03/12/15 0555 03/12/15 1411  TROPONINI <0.03 <0.03 <0.03 <0.03   BNP: BNP (last 3 results)  Recent Labs  03/11/15 1740 03/12/15 1411  BNP 95.7 183.5*    ProBNP (last 3 results) No results for input(s): PROBNP in the last 8760 hours.  CBG:  Recent Labs Lab 03/12/15 1311 03/12/15 1805 03/12/15 2228 03/13/15 0758 03/13/15 1216  GLUCAP 171* 189* 294* 221* 253*       Signed:  Teleshia Lemere A  [facility] 03/13/2015, 12:41 PM

## 2015-03-13 NOTE — Progress Notes (Signed)
PULMONARY / CRITICAL CARE MEDICINE   Name: Ashley Mckee MRN: [id number] DOB: 12-29-1965    ADMISSION DATE:  03/11/2015 CONSULTATION DATE:  03/12/2015  REFERRING MD :  Adin Hector  CHIEF COMPLAINT:  Shortness of breath  INITIAL PRESENTATION: 49 year old female with a history of non-Hodgkin's lymphoma in remission and known left diaphragm dysfunction was admitted on 03/11/2015 with hypoxemic respiratory failure.  STUDIES:  03/11/2015 CT angiogram chest: No pulmonary embolism, elevated left hemidiaphragm, anterior mediastinal soft tissue thickening of undetermined significance, left lung radiation scarring noted, scattered pulmonary nodules approximately 5 mm in size, calcified granuloma left lung, small patch of groundglass right lower lobe uncertain etiology  SIGNIFICANT EVENTS: 03/11/2015 admission   SUBJECTIVE: Breathing is better today  VITAL SIGNS: Temp:  [97.3 F (36.3 C)-98.6 F (37 C)] 97.3 F (36.3 C) (10/17 0524) Pulse Rate:  [81-98] 81 (10/17 0524) Resp:  [18] 18 (10/17 0524) BP: (96-126)/(57-61) 96/57 mmHg (10/17 0524) SpO2:  [90 %-97 %] 95 % (10/17 0904) Weight:  [106.278 kg (234 lb 4.8 oz)-106.7 kg (235 lb 3.7 oz)] 106.278 kg (234 lb 4.8 oz) (10/17 0524) HEMODYNAMICS:   VENTILATOR SETTINGS:   INTAKE / OUTPUT:  Intake/Output Summary (Last 24 hours) at 03/13/15 1122 Last data filed at 03/12/15 1829  Gross per 24 hour  Intake    720 ml  Output      0 ml  Net    720 ml    PHYSICAL EXAMINATION: General:  Sitting up in bed, no acute distress Neuro:  Awake alert, oriented 4, moving all 4 extremities HEENT:  Normocephalic atraumatic, oropharynx clear, extraocular movements intact Cardiovascular:  Regular rate and rhythm, slight systolic murmur, no JVD, no pedal edema Lungs:  Clear to auscultation on the right, poor air movement on the left but clear today Abdomen:  Bowel sounds positive, nontender nondistended Musculoskeletal:  Normal bulk and  tone Skin:  No rash or skin breakdown  LABS:  CBC  Recent Labs Lab 03/11/15 1730 03/12/15 0555 03/13/15 0416  WBC 11.9* 10.9* 17.9*  HGB 14.0 12.0 12.3  HCT 41.8 36.1 36.6  PLT 211 179 209   Coag's  Recent Labs Lab 03/12/15 0555  INR 1.11   BMET  Recent Labs Lab 03/11/15 1730 03/12/15 0555 03/13/15 0416  NA 139 137 138  K 3.9 4.3 4.1  CL 102 104 105  CO2 32 25 25  BUN 11 9 16   CREATININE 0.85 0.79 0.79  GLUCOSE 142* 227* 224*   Electrolytes  Recent Labs Lab 03/11/15 1730 03/12/15 0555 03/13/15 0416  CALCIUM 9.3 8.0* 8.6*   Sepsis Markers  Recent Labs Lab 03/11/15 2119 03/11/15 2154 03/12/15 0555  LATICACIDVEN 2.96* 2.88* 2.2*   ABG  Recent Labs Lab 03/11/15 2228  PHART 7.312*  PCO2ART 44.0  PO2ART 65.0*   Liver Enzymes  Recent Labs Lab 03/11/15 1730 03/12/15 0555 03/13/15 0416  AST 89* 62* 122*  ALT 69* 74* 157*  ALKPHOS 91 73 77  BILITOT 0.9 0.4 0.5  ALBUMIN 4.2 3.4* 3.5   Cardiac Enzymes  Recent Labs Lab 03/12/15 0010 03/12/15 0555 03/12/15 1411  TROPONINI <0.03 <0.03 <0.03   Glucose  Recent Labs Lab 03/12/15 1311 03/12/15 1805 03/12/15 2228  GLUCAP 171* 189* 294*    Imaging 10/17 CXR images personally reviewed> left hemidiaphragm paralysis, nodule left, no infiltrate, I do not appreciate a pleural effusion   ASSESSMENT / PLAN:  PULMONARY  A: Acute hypoxemic respiratory failure in the setting of an upper respiratory infection with known  diaphragm paralysis.  Improving.  She does not have pneumonia.  Questionable mediastinal mass  P:   Ambulate today on room air Wean off O2 Out of bed Incentive spirometry Flutter valve Continue guaifenesin Titrated to as needed to maintain O2 saturation greater than 92% Will stop vancomycin and azithromycin as she does not have pneumonia Likely stop ceftriaxone tomorrow  CARDIOVASCULAR A: Heart murmur, she says this is chronic P:  We'll follow-up  echocardiogram result   HEMATOLOGIC A:  Non-Hodgkin's lymphoma in remission, no clear evidence of progression by history, we really need to see her old studies (CT scans, clinic notes) before any further workup P:  Per oncology  INFECTIOUS A:  Acute bronchitis, likely viral She does not have pneumonia P:   BCx2 03/11/2015> UC 03/11/2015>  Ceftriaxone 03/11/2015 > Azithromycin 03/11/2015 >10/17 Vanc 10/15 > 10/17    FAMILY  - Updates: husband updated at bedside.   From my perspective she could go home with f/u with [place] if she is ambulating on room air    Roselie Awkward, MD [facility] Pager: [phone] Cell: [phone] After 3pm or if no response, call [phone]   03/13/2015, 11:22 AM

## 2015-03-13 NOTE — Progress Notes (Signed)
Received page from [place], [place] regarding diabetes education consult. Diabetes Coordinator is not on campus at this time.  A1C 6.1% on 03/12/15 indicating pre-diabetes. Patient has no prior history of diabetes and was receiving steroids during hospital admission which is likely cause of hyperglycemia. Noted diabetes education order is for outpatient education and according to Dr. Paulina Fusi discharge summary, MD is requesting dietary education. Asked Jenny Reichmann, RN to consult RD for Carb Modified diet education. Since Dr. Willia Craze has ordered outpatient education, [facility] should be in contact with the patient for further education. Asked Jenny Reichmann, RN to have patient watch patient education video 507 on diabetic diet.  Thanks, Barnie Alderman, RN, MSN, CCRN, CDE Diabetes Coordinator Inpatient Diabetes Program [phone] (Team Pager from [place] to [place]) [phone] ([facility] office) [phone] [facility] office) [phone] [facility] office)

## 2015-03-13 NOTE — Progress Notes (Addendum)
Spoke with Ashley Mckee from Diabetes Management. She noted the order  for Diabetic teaching was for out patient. I had the pt watch video 507 and gave her many handouts from exit care about diabetes and diet. She knows what her A1C was and that she can probably control it by her diet. Will go to out pt teaching when they schedule her.Discharge instructions explained to pt and husband and script also given to pt. Discharged via wheelchair to husband.

## 2015-03-13 NOTE — Progress Notes (Signed)
Ashley Mckee   DOB:Jun 19, 1965   XT#:[id number]    Subjective: She feels better. Shortness of breath is improving. Denies any cough. No fevers or chills.  Objective:  Filed Vitals:   03/13/15 0524  BP: 96/57  Pulse: 81  Temp: 97.3 F (36.3 C)  Resp: 18     Intake/Output Summary (Last 24 hours) at 03/13/15 0916 Last data filed at 03/12/15 1829  Gross per 24 hour  Intake    720 ml  Output      0 ml  Net    720 ml    GENERAL:alert, no distress and comfortable SKIN: skin color, texture, turgor are normal, no rashes or significant lesions EYES: normal, Conjunctiva are pink and non-injected, sclera clear LUNGS: clear to auscultation and percussion with normal breathing effort. Reduced breath sounds on the left lower base Musculoskeletal:no cyanosis of digits and no clubbing  NEURO: alert & oriented x 3 with fluent speech, no focal motor/sensory deficits   Labs:  Lab Results  Component Value Date   WBC 17.9* 03/13/2015   HGB 12.3 03/13/2015   HCT 36.6 03/13/2015   MCV 93.4 03/13/2015   PLT 209 03/13/2015   NEUTROABS 10.5* 03/11/2015    Lab Results  Component Value Date   NA 138 03/13/2015   K 4.1 03/13/2015   CL 105 03/13/2015   CO2 25 03/13/2015    Studies:  Dg Chest 2 View  03/13/2015  CLINICAL DATA:  Shortness breath.  Cough.  Paralyzed diaphragm. EXAM: CHEST - 2 VIEW COMPARISON:  Two-view chest x-ray 03/11/2015. FINDINGS: The heart is upper limits of normal for size. The left hemidiaphragm remains elevated. A small left pleural effusion persists. Left basilar atelectasis is noted. The upper lung fields are clear. A 4 mm granuloma is again seen in the left upper lobe. The visualized soft tissues and bony thorax are unremarkable. IMPRESSION: 1. Stable elevation of the left hemidiaphragm. 2. Small left pleural effusion. 3. Left basilar airspace disease likely reflects atelectasis. Electronically Signed   By: San Morelle M.D.   On: 03/13/2015 08:45   Dg Chest 2  View  03/11/2015  CLINICAL DATA:  Cough and shortness of breath for 2 days. Non-Hodgkin's lymphoma. EXAM: CHEST  2 VIEW COMPARISON:  None. FINDINGS: Heart size and mediastinal contours are within normal limits. Elevation of left hemidiaphragm noted. No evidence of pulmonary infiltrate or edema. No evidence of pneumothorax or pleural effusion. Surgical clips noted in anterior mediastinum. IMPRESSION: Elevated left hemidiaphragm.  No active lung disease. Electronically Signed   By: Earle Gell M.D.   On: 03/11/2015 18:45   Ct Angio Chest Pe W/cm &/or Wo Cm  03/11/2015  CLINICAL DATA:  Cough with shortness of breath for 2 days. Upper chest pain with coughing. History of non-Hodgkin's lymphoma. EXAM: CT ANGIOGRAPHY CHEST WITH CONTRAST TECHNIQUE: Multidetector CT imaging of the chest was performed using the standard protocol during bolus administration of intravenous contrast. Multiplanar CT image reconstructions and MIPs were obtained to evaluate the vascular anatomy. CONTRAST:  140mL OMNIPAQUE IOHEXOL 350 MG/ML SOLN COMPARISON:  None. FINDINGS: Angiographic study: No evidence of a pulmonary embolus. Thoracic aorta is normal in caliber. No dissection. No atherosclerotic plaque. Thoracic inlet:  No mass or adenopathy. Mediastinum and hila: Low-attenuation soft tissue abuts the main pulmonary artery and contacts the ascending aorta and aortic arch, partly surrounding the innominate, left common carotid artery common origin. Irregular dense material consistent with contrast, or possibly dense calcification, projects along the left anterior margin of this  soft tissue. The left break cephalic vein is markedly narrowed as it passes over the soft tissue. Contrast-enhanced venous blood extends through multiple chest wall collaterals and along the azygos hemi azygous system. No mediastinal or hilar adenopathy. No hilar masses. Heart normal in size and configuration. Lungs and pleura: Bronchovascular opacities noted in the  medial left upper lobe, with some associated volume loss, and to a lesser degree in the medial left lower lobe superior segment and medial right upper lobe. This has the appearance of scarring from radiation therapy. There are calcified granuloma in the right upper lobe, lateral left upper lobe and left lower lobe. There is a 6 mm noncalcified nodule in the right upper lobe, image 24, series 10. 2-3 mm nodule lies laterally in the right lower lobe. 4 mm noncalcified nodule lies in the medial right lower lobe, image 38, series 10. There is a focal area of ground-glass opacity in the peripheral right lower lobe centered on image 53. No pulmonary edema. No pleural effusion or pneumothorax. Limited upper abdomen: Fatty infiltration of the liver. Otherwise unremarkable. Musculoskeletal: No osteoblastic or osteolytic lesions. No significant degenerative change. Review of the MIP images confirms the above findings. IMPRESSION: 1. Abnormal anterior mediastinal soft tissue which abuts the main pulmonary artery and aortic arch. This causes significant narrowing of the left brachiocephalic vein, with contrast enhanced venous blood diverted into multiple collaterals. There is dense material within the left anterior aspect of this abnormal soft tissue which may reflect contrast or dense calcification. These findings may be from treated lymphoma. Recurrent lymphoma is possible. 2. No evidence of a pulmonary embolus. 3. No acute findings in the lungs. There is presumed radiation induced scarring along the medial aspect of the left and right upper lobes and superior segment left lower lobe. Several calcified granuloma with several other noncalcified nodules. Recommend followup evaluation of the noncalcified nodules in 6-12 months to document stability. Electronically Signed   By: Lajean Manes M.D.   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 We will obtain outside records today. The patient has a copy of old CT scan of the chest and we will upload it in the system. 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 A lot of collaterals are seen on CT scan Observe only.  Increased work of breathing with shortness of breath and mild hypoxemia Possible community-acquired pneumonia Known left diaphragmatic paralysis I will consult pulmonologist for evaluation.  She is currently on broad-spectrum IV antibiotics and oxygen therapy She is improving. Hopefully, she can be transitioned to oral antibiotics upon discharge   S/p BMT The patient will benefit from pneumococcal vaccination after she recovers from current hospitalization. I can arrange for that in the outpatient   I will sign off. I will arrange outpatient follow-up next week.  [place], [place], MD 03/13/2015  9:16 AM

## 2015-03-13 NOTE — Telephone Encounter (Signed)
S/w patient and gave hosp f/u appt for 10/27 @ 3:45 w/[place]

## 2015-03-13 NOTE — Progress Notes (Signed)
Faxed Release of information forms to [place] dept at fax [phone].   Dr. Alvy Bimler requesting records 2001 r/t Bone Marrow Transplant.

## 2015-03-14 ENCOUNTER — Inpatient Hospital Stay
Admission: RE | Admit: 2015-03-14 | Discharge: 2015-03-14 | Disposition: A | Discharge status: Home / Self Care | Payer: Self-pay | Source: Ambulatory Visit | Attending: Hematology and Oncology | Admitting: Hematology and Oncology

## 2015-03-14 ENCOUNTER — Other Ambulatory Visit: Payer: Self-pay | Admitting: Hematology and Oncology

## 2015-03-14 ENCOUNTER — Telehealth: Payer: Self-pay | Admitting: *Deleted

## 2015-03-14 DIAGNOSIS — C8290 Follicular lymphoma, unspecified, unspecified site: Secondary | ICD-10-CM

## 2015-03-14 DIAGNOSIS — J986 Disorders of diaphragm: Secondary | ICD-10-CM

## 2015-03-14 NOTE — Telephone Encounter (Signed)
-----   Message from Juanito Doom, MD sent at 03/13/2015 11:25 AM EDT ----- Donella Stade,  She will need follow up with me or Tammy for diaphragm paralysis, will need PFT and CXR. 4-6 weeks  FYI she is still in the hospital today, will likely go home this week.  Thanks [employer]

## 2015-03-14 NOTE — Telephone Encounter (Signed)
Spoke with pt.  Pt scheduled for PFT on 04/26/15 at 9 am with HFU at 10 am same day with TP.  Pt confirmed appts. PFT pt Instruction form mailed to pt's verified home address.  Pt aware and voiced no further questions or concerns at this time.

## 2015-03-15 ENCOUNTER — Ambulatory Visit (INDEPENDENT_AMBULATORY_CARE_PROVIDER_SITE_OTHER): Payer: 59 | Admitting: Internal Medicine

## 2015-03-15 ENCOUNTER — Ambulatory Visit: Payer: 59 | Admitting: Gastroenterology

## 2015-03-15 ENCOUNTER — Telehealth: Payer: Self-pay | Admitting: Pulmonary Disease

## 2015-03-15 ENCOUNTER — Encounter: Payer: Self-pay | Admitting: Internal Medicine

## 2015-03-15 VITALS — BP 122/82 | HR 81 | Ht 65.0 in | Wt 233.0 lb

## 2015-03-15 DIAGNOSIS — R058 Other specified cough: Secondary | ICD-10-CM | POA: Insufficient documentation

## 2015-03-15 DIAGNOSIS — E669 Obesity, unspecified: Secondary | ICD-10-CM

## 2015-03-15 DIAGNOSIS — J986 Disorders of diaphragm: Secondary | ICD-10-CM

## 2015-03-15 DIAGNOSIS — I10 Essential (primary) hypertension: Secondary | ICD-10-CM

## 2015-03-15 DIAGNOSIS — R05 Cough: Secondary | ICD-10-CM

## 2015-03-15 MED ORDER — MOMETASONE FURO-FORMOTEROL FUM 100-5 MCG/ACT IN AERO: INHALATION_SPRAY | RESPIRATORY_TRACT | Status: DC

## 2015-03-15 MED ORDER — VALSARTAN 160 MG PO TABS: 160.0000 mg | ORAL_TABLET | Freq: Every day | ORAL | Status: DC

## 2015-03-15 MED ORDER — PREDNISONE 10 MG PO TABS
ORAL_TABLET | ORAL | Status: DC
Start: 2015-03-15 — End: 2015-03-23

## 2015-03-15 MED ORDER — HYDROCODONE-HOMATROPINE 5-1.5 MG/5ML PO SYRP: 5.0000 mL | ORAL_SOLUTION | Freq: Four times a day (QID) | ORAL | Status: DC | PRN

## 2015-03-15 NOTE — Assessment & Plan Note (Signed)
ACE inhibitors are problematic in  pts with airway complaints because  even experienced pulmonologists can't always distinguish ace effects from copd/asthma/pnds/ allergies etc.  By themselves they don't actually cause a problem, much like oxygen can't by itself start a fire, but they certainly serve as a powerful catalyst or enhancer for any "fire"  or inflammatory process in the upper airway, be it caused by an ET  tube or more commonly reflux (especially in the obese or pts with known GERD or who are on biphoshonates) or URI's, due to interference with bradykinin clearance.  The effects of acei on bradykinin levels occurs in 100% of pt's on acei (unless they surreptitiously stop the med!) but the classic cough is only reported in 5%.  This leaves 95% of pts on acei's  with a variety of syndromes including no identifiable symptom in most  vs non-specific symptoms that wax and wane depending on what other insult is occuring at the level of the upper airway, esp GERD here.   rec trial of diovan 160 mg daily instead

## 2015-03-15 NOTE — Assessment & Plan Note (Addendum)
Classic Upper airway cough syndrome, so named because it's frequently impossible to sort out how much is  CR/sinusitis with freq throat clearing (which can be related to primary GERD)   vs  causing  secondary (" extra esophageal")  GERD from wide swings in gastric pressure that occur with throat clearing, often  promoting self use of mint and menthol lozenges that reduce the lower esophageal sphincter tone and exacerbate the problem further in a cyclical fashion.   These are the same pts (now being labeled as having "irritable larynx syndrome" by some cough centers) who not infrequently have a history of having failed to tolerate ace inhibitors,  dry powder inhalers(and even some hfa ICS)  or biphosphonates or report having atypical reflux symptoms that don't respond to standard doses of PPI , and are easily confused as having aecopd or asthma flares by even experienced allergists/ pulmonologists.   She clearly should not have restarted acei in this setting and needs a minimum of 6 weeks off but in meantime rec max rx for gerd/ cyclical coughing reviewed.  Will leave her on dulera 100 for now but doubt she needs it longterm  I had an extended discussion with the patient reviewing all relevant studies completed to date and  lasting 66minutes of a 40 minute acute visit    The proper method of use, as well as anticipated side effects, of a metered-dose inhaler are discussed and demonstrated to the patient. Improved effectiveness after extensive coaching during this visit to a level of approximately  75%   Each maintenance medication was reviewed in detail including most importantly the difference between maintenance and prns and under what circumstances the prns are to be triggered using an action plan format that is not reflected in the computer generated alphabetically organized AVS.    Please see instructions for details which were reviewed in writing and the patient given a copy highlighting the part  that I personally wrote and discussed at today's ov.

## 2015-03-15 NOTE — Patient Instructions (Addendum)
No more lisinopril  Diovan 160 mg one daily   Prednisone 10 mg take  4 each am x 2 days,   2 each am x 2 days,  1 each am x 2 days and stop   Try prilosec otc 20mg   Take 30-60 min before first meal of the day and Pepcid ac (famotidine) 20 mg one @  bedtime until cough is completely gone for at least a week without the need for cough suppression  dulera 100 Take 2 puffs first thing in am and then another 2 puffs about 12 hours later.   Only use your albuterol as a rescue medication to be used if you can't catch your breath by resting or doing a relaxed purse lip breathing pattern.  - The less you use it, the better it will work when you need it. - Ok to use up to 2 puffs  every 4 hours if you must but call for immediate appointment if use goes up over your usual need - Don't leave home without it !!  (think of it like the spare tire for your car)   Cough > hycodan cough syrup every 4 hours as needed   Please schedule a follow up office visit in 2 weeks, sooner if needed

## 2015-03-15 NOTE — Telephone Encounter (Signed)
Spoke with the pt  She states that her breathing is not improved since hospital d/c  She is using rescue inhaler every 6 hours with no relief  OV with MW for 3:45 today  Pt advised to bring all meds to appt

## 2015-03-15 NOTE — Assessment & Plan Note (Signed)
Body mass index is 38.77    Lab Results  Component Value Date   TSH 0.37 01/19/2015     Contributing to gerd tendency/ chronic doe esp with one HD out/reviewed the need and the process to achieve and maintain neg calorie balance > defer f/u primary care including intermittently monitoring thyroid status

## 2015-03-15 NOTE — Progress Notes (Signed)
Subjective:     Patient ID: Ashley Mckee, female   DOB: 1966-01-16,     MRN: [id number]  HPI   40 yowf quit smoking 2001 due to lymphoma s/p RT to chest and chemo but no resp problems and then autologous BMT and chronic doe attributed to paralyzed L HD in [place] and then acutely worse cough/wheeze/sob > admit to [facility]  Admit date: 03/11/2015 Discharge date: 03/13/2015  Recommendations for Outpatient Follow-up:  Follow up with oncologist for further evaluation of lymphoma.  Follow up with pulmonary Hepatitis panel pending.   Discharge Diagnoses:   Acute hypoxemic respiratory failure (HCC)  Hypothyroidism  Diaphragm paralysis  Mediastinal mass  NHL (non-Hodgkin's lymphoma) ([place]), treated with chemo X2 and radiation  Acute respiratory failure with hypoxemia [facility])   Discharge Condition: Stable  Diet recommendation: Carb Modified  Filed Weights   03/12/15 0400 03/12/15 1425 03/13/15 0524  Weight: 106.7 kg (235 lb 3.7 oz) 106.7 kg (235 lb 3.7 oz) 106.278 kg (234 lb 4.8 oz)    History of present illness:  Ashley Mckee is a 49 y.o. female with Past medical history of non-Hodgkin's lymphoma status post chemotherapy 2 as well as radiation therapy treated in [place], diastolic CHF, hypothyroidism, history of autologous bone marrow transplantation 2001. The patient is presenting with numbness of one day history of dry cough as well as shortness of breath. Patient also complains of pleuritic chest pain. Patient did have some fever as well as chills. Patient mentions at work she had one for coworker having similar respiratory symptoms. She mentions that her baseline she does have some orthopnea. But recently her breathing has progressively worsened last night and she couldn't sleep.  Hospital Course:  1. Acute hypoxemic respiratory failure (HCC) , Acute bronchitis with bronchospasm. Probable chronic diastolic dysfunction, PNA; presents with dyspnea, leukocytolysis, mildly elevated  lactic acid.  -was cover with Vancomycin, ceftriaxone , azithromycin treatment for PNA. she will be discharge on 3 more days of azithro.  - Continue with Duo nebs.  -sputum culture, blood culture as well as influenza PCR negative - strep negative, legionella urine antigens pending .  -ECHO, normal Ef, no diastolic dysfunction.  Leukocytosis related to steroids.  Discharge today if able to ambulate and no hypoxemia  2.History of non-Hodgkin's lymphoma. Anterior mediastinal mass.,  -Oncologist consulted, CVTS was consulted.  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  Observe per oncologist.   Hypothyroidism Recent TSH was adequate. Continue Synthroid.  Transaminases hepatitis panel pending.   Hyperglycemia; on steroids. Hb A-1c 6.1 suspect Blood sugar will decreased after stoping steroids. Diabetes educator for diet recommendation consulted.   Procedures:  ECHO; normal EF  Consultations:  Oncology  Pulmonary  Discharge Exam: Filed Vitals:   03/13/15 0524  BP: 96/57  Pulse: 81  Temp: 97.3 F (36.3 C)  Resp: 18    General: NAD Cardiovascular: S 1, S 2 RRR Respiratory: crakles bases  Discharge Instructions   Discharge Instructions    Diet Carb Modified  Complete by: As directed      Increase activity slowly  Complete by: As directed           Current Discharge Medication List    START taking these medications   Details  azithromycin (ZITHROMAX) 500 MG tablet Take 1 tablet (500 mg total) by mouth  daily. Qty: 2 tablet, Refills: 0    guaiFENesin (MUCINEX) 600 MG 12 hr tablet Take 2 tablets (1,200 mg total) by mouth 2 (two) times daily. Qty: 30 tablet,  Refills: 0      CONTINUE these medications which have NOT CHANGED   Details  albuterol (PROVENTIL HFA;VENTOLIN HFA) 108 (90 BASE) MCG/ACT inhaler Inhale 1 puff into the lungs every 6 (six) hours as needed for wheezing or shortness of breath.    clotrimazole-betamethasone (LOTRISONE) cream Apply 1 application topically 3 (three) times daily as needed.    SYNTHROID 137 MCG tablet Take 1 tablet (137 mcg total) by mouth daily before breakfast. Qty: 90 tablet, Refills: 3   Associated Diagnoses: Hypothyroidism, unspecified hypothyroidism type      STOP taking these medications     lisinopril (PRINIVIL,ZESTRIL) 10 MG tablet      pramoxine (PROCTOFOAM) 1 % foam           03/15/2015 acute extended ov/[name] re: worse cough/sob back on ACEi  Chief Complaint  Patient presents with  . Acute Visit    Hosp f/u. SOB. Pt c/o dry cough, wheeze and SOB since being dc'd. Pt states that she has been using her albuterol HFA 2puffs q6h every day with little to no relief. Pt has no other inhalers.    some better during admit off acei then much worse sob/ dry cough/ immediate strangling sensation when supine so sitting up all night getting no sleep. slt better while on prednisone but maybe only 25%    No obvious patterns in day to day or daytime variability or assoc chronic cough or cp or chest tightness, subjective wheeze or overt sinus or hb symptoms. No unusual exp hx or h/o childhood pna/ asthma or knowledge of premature birth and no prev h/o asthma as adult   Sleeping ok without nocturnal  or early am exacerbation  of respiratory  c/o's or need for noct saba. Also denies any obvious fluctuation of symptoms with weather or environmental changes or other aggravating or alleviating factors except as outlined above   Current Medications, Allergies, Complete Past Medical History, Past Surgical History, Family History, and Social History were reviewed in [employer] record.  ROS  The following are not active complaints unless bolded sore throat, dysphagia, dental problems, itching, sneezing,  nasal congestion or excess/ purulent secretions, ear ache,   fever, chills, sweats, unintended wt loss, classically pleuritic or exertional cp, hemoptysis,  orthopnea pnd or leg swelling, presyncope, palpitations, abdominal pain, anorexia, nausea, vomiting, diarrhea  or change in bowel or bladder habits, change in stools or urine, dysuria,hematuria,  rash, arthralgias, visual complaints, headache, numbness, weakness or ataxia or problems with walking or coordination,  change in mood/affect or memory.         Review of Systems     Objective:   Physical Exam    amb obese wf nad classic pseudowheeze   Wt Readings from Last 3 Encounters:  03/15/15 233 lb (105.688 kg)  03/13/15 234 lb 4.8 oz (106.278 kg)  01/19/15 225 lb 3.2 oz (102.15 kg)    Vital signs reviewed   HEENT: nl dentition, turbinates, and orophanx. Nl external ear canals without cough reflex   NECK :  without JVD/Nodes/TM/ nl carotid upstrokes bilaterally   LUNGS: no acc muscle use, completely clear to A and P bilaterally except for transmitted pseudowheezing    CV:  RRR  no s3 or murmur or increase in P2, no edema   ABD:  soft and nontender with nl excursion in the supine position. No bruits or organomegaly, bowel sounds nl  MS:  warm without deformities, calf tenderness, cyanosis or clubbing  SKIN: warm and dry without lesions    NEURO:  alert, approp, no deficits       Assessment:

## 2015-03-15 NOTE — Assessment & Plan Note (Signed)
L eft side-- secondary to radiation/ chemo  Limits ventilatory reserve but should not really affect cough mechanics all that much and wt gain complicates this further (see obesity)

## 2015-03-16 LAB — CULTURE, BLOOD (ROUTINE X 2)
CULTURE: NO GROWTH
CULTURE: NO GROWTH

## 2015-03-20 ENCOUNTER — Ambulatory Visit: Payer: 59 | Admitting: Family Medicine

## 2015-03-23 ENCOUNTER — Encounter: Payer: Self-pay | Admitting: Hematology and Oncology

## 2015-03-23 ENCOUNTER — Ambulatory Visit (HOSPITAL_BASED_OUTPATIENT_CLINIC_OR_DEPARTMENT_OTHER): Payer: 59 | Admitting: Hematology and Oncology

## 2015-03-23 VITALS — BP 117/72 | HR 94 | Temp 97.8°F | Resp 18 | Ht 65.0 in | Wt 222.6 lb

## 2015-03-23 DIAGNOSIS — Z23 Encounter for immunization: Secondary | ICD-10-CM

## 2015-03-23 DIAGNOSIS — J986 Disorders of diaphragm: Secondary | ICD-10-CM

## 2015-03-23 DIAGNOSIS — Z9481 Bone marrow transplant status: Secondary | ICD-10-CM | POA: Diagnosis not present

## 2015-03-23 DIAGNOSIS — Z8572 Personal history of non-Hodgkin lymphomas: Secondary | ICD-10-CM

## 2015-03-23 HISTORY — DX: Personal history of non-Hodgkin lymphomas: Z85.72

## 2015-03-23 MED ORDER — PNEUMOCOCCAL 13-VAL CONJ VACC IM SUSP
0.5000 mL | Freq: Once | INTRAMUSCULAR | Status: AC
  Administered 2015-03-23: 0.5 mL via INTRAMUSCULAR
  Filled 2015-03-23: qty 0.5

## 2015-03-23 NOTE — Progress Notes (Signed)
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   09/29/1998 - 02/23/1999 Chemotherapy She received 8 cycles of CHOP   02/26/1999 Imaging repeat imaging showed residual mass measured about 6 cm   03/05/1999 - 05/07/1999 Radiation Therapy She finished consolidation treatment with radiation   07/31/1999 Imaging Repeat CT scan showed enlarging mass 7 cm maximum dimension   08/06/1999 Procedure CT guided biopsy non-diagnostic   08/15/1999 Surgery She had left thoracotomy and biopsy that confirmed disease relapse   08/27/1999 - 11/25/2014 Chemotherapy She received 4 cycles of R-ICE chemo with marginal response   12/05/1999 - 01/06/2000 Chemotherapy She was admitted to receive high dose VP-16 for stem cell mobilization   12/05/1999 Bone Marrow Biopsy BM biopsy was negative   01/10/2000 Bone Marrow Transplant she underwent autologous stem cell transplant in [place]: Please see below for problem oriented charting. She returns for further follow-up. Her sensation of dyspnea has improved. She denies recent cough. She feels well.  REVIEW OF SYSTEMS:   Constitutional: Denies fevers, chills or abnormal weight loss Eyes: Denies blurriness of vision Ears, nose, mouth, throat, and face: Denies mucositis or sore throat Respiratory: Denies cough, dyspnea or wheezes Cardiovascular: Denies palpitation, chest discomfort or lower extremity swelling Gastrointestinal:  Denies nausea, heartburn  or change in bowel habits Skin: Denies abnormal skin rashes Lymphatics: Denies new lymphadenopathy or easy bruising Neurological:Denies numbness, tingling or new weaknesses Behavioral/Psych: Mood is stable, no new changes  All other systems were reviewed with the patient and are negative.  I have reviewed the past medical history, past surgical history, social history and family history with the patient and they are unchanged from previous note.  ALLERGIES:  has No Known Allergies.  MEDICATIONS:  Current Outpatient Prescriptions  Medication Sig Dispense Refill  . albuterol (PROVENTIL HFA;VENTOLIN HFA) 108 (90 BASE) MCG/ACT inhaler Inhale 1 puff into the lungs every 6 (six) hours as needed for wheezing or shortness of breath.    . clotrimazole-betamethasone (LOTRISONE) cream Apply 1 application topically 3 (three) times daily as needed.    Marland Kitchen ESTRACE VAGINAL 0.1 MG/GM vaginal cream   4  . mometasone-formoterol (DULERA) 100-5 MCG/ACT AERO Take 2 puffs first thing in am and then another 2 puffs about 12 hours later.    Marland Kitchen SYNTHROID 137 MCG tablet Take 1 tablet (137 mcg total) by mouth daily before breakfast. 90 tablet 3  . valsartan (DIOVAN) 160 MG tablet Take 1 tablet (160 mg total) by mouth daily. 30 tablet 11   No current facility-administered medications for this visit.    PHYSICAL EXAMINATION: ECOG PERFORMANCE STATUS: 0 - Asymptomatic  Filed Vitals:   03/23/15 1559  BP: 117/72  Pulse: 94  Temp: 97.8 F (36.6 C)  Resp: 18   Filed Weights   03/23/15 1559  Weight: 222 lb 9.6 oz (100.971 kg)    GENERAL:alert, no distress and comfortable SKIN: skin color, texture, turgor are normal, no rashes or significant lesions EYES: normal, Conjunctiva are pink and non-injected,  sclera clear Musculoskeletal:no cyanosis of digits and no clubbing  NEURO: alert & oriented x 3 with fluent speech, no focal motor/sensory deficits  LABORATORY DATA:  I have reviewed the data as listed     Component Value Date/Time   NA 138 03/13/2015 0416   K 4.1 03/13/2015 0416   CL 105 03/13/2015 0416   CO2 25 03/13/2015 0416   GLUCOSE 224* 03/13/2015 0416   BUN 16 03/13/2015 0416   CREATININE 0.79 03/13/2015 0416   CALCIUM 8.6* 03/13/2015 0416   PROT 6.9 03/13/2015 0416   ALBUMIN 3.5 03/13/2015 0416   AST 122* 03/13/2015 0416   ALT 157* 03/13/2015 0416   ALKPHOS 77 03/13/2015 0416   BILITOT 0.5 03/13/2015 0416   GFRNONAA >60 03/13/2015 0416   GFRAA >60 03/13/2015 0416    No results found for: SPEP, UPEP  Lab Results  Component Value Date   WBC 17.9* 03/13/2015   NEUTROABS 10.5* 03/11/2015   HGB 12.3 03/13/2015   HCT 36.6 03/13/2015   MCV 93.4 03/13/2015   PLT 209 03/13/2015      Chemistry      Component Value Date/Time   NA 138 03/13/2015 0416   K 4.1 03/13/2015 0416   CL 105 03/13/2015 0416   CO2 25 03/13/2015 0416   BUN 16 03/13/2015 0416   CREATININE 0.79 03/13/2015 0416      Component Value Date/Time   CALCIUM 8.6* 03/13/2015 0416   ALKPHOS 77 03/13/2015 0416   AST 122* 03/13/2015 0416   ALT 157* 03/13/2015 0416   BILITOT 0.5 03/13/2015 0416     I reviewed the CT scan with the patient. ASSESSMENT & PLAN:  History of B-cell lymphoma I reviewed the CT scan with the patient. There is no evidence to suggest disease progression. Residual mass seen in the mediastinum is likely just fibrosis. She does not need long-term follow-up. I recommend she attends the cancer survivorship clinic next year prior to discharge.  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  S/P bone marrow transplant ([place]) I review her vaccination records. I recommend pneumococcal vaccination and she agreed to proceed.     Orders Placed This Encounter  Procedures  . Amb Referral to Survivorship Long term    Referral Priority:  Routine    Referral Type:  Consultation    Number of Visits  Requested:  1   All questions were answered. The patient knows to call the clinic with any problems, questions or concerns. No barriers to learning was detected. I spent 15 minutes counseling the patient face to face. The total time spent in the appointment was 20 minutes and more than 50% was on counseling and review of test results     [facility], [place], MD 03/23/2015 5:28 PM

## 2015-03-23 NOTE — Assessment & Plan Note (Signed)
I review her vaccination records. I recommend pneumococcal vaccination and she agreed to proceed.

## 2015-03-23 NOTE — Assessment & Plan Note (Signed)
I reviewed the CT scan with the patient. There is no evidence to suggest disease progression. Residual mass seen in the mediastinum is likely just fibrosis. She does not need long-term follow-up. I recommend she attends the cancer survivorship clinic next year prior to discharge.

## 2015-03-23 NOTE — Assessment & Plan Note (Addendum)
The patient had unilateral diaphragmatic paralysis She follows with pulmonologist for long-term management.

## 2015-03-24 ENCOUNTER — Other Ambulatory Visit: Payer: Self-pay | Admitting: Adult Health

## 2015-03-24 ENCOUNTER — Telehealth: Payer: Self-pay | Admitting: Adult Health

## 2015-03-24 DIAGNOSIS — Z8572 Personal history of non-Hodgkin lymphomas: Secondary | ICD-10-CM

## 2015-03-24 DIAGNOSIS — Z9481 Bone marrow transplant status: Secondary | ICD-10-CM

## 2015-03-24 NOTE — Telephone Encounter (Signed)
I left Ashley Mckee a voicemail letting her know that her survivorship appt has been scheduled with me, as requested by Dr. Alvy Bimler.  She will see me on 03/21/16 with labs preceding the visit.  I have also mailed an appointment calendar and my business card to her in the mail, as well.  I encouraged her to call me with any questions or concerns before her next visit here and we would be happy to see her at any time.  I look forward to participating in her care.   Mike Craze, NP [place] [phone]

## 2015-03-29 ENCOUNTER — Encounter: Payer: Self-pay | Admitting: Internal Medicine

## 2015-03-29 ENCOUNTER — Ambulatory Visit (INDEPENDENT_AMBULATORY_CARE_PROVIDER_SITE_OTHER): Payer: 59 | Admitting: Internal Medicine

## 2015-03-29 VITALS — BP 126/72 | HR 93 | Ht 65.0 in | Wt 224.0 lb

## 2015-03-29 DIAGNOSIS — J986 Disorders of diaphragm: Secondary | ICD-10-CM | POA: Diagnosis not present

## 2015-03-29 DIAGNOSIS — R05 Cough: Secondary | ICD-10-CM | POA: Diagnosis not present

## 2015-03-29 DIAGNOSIS — I10 Essential (primary) hypertension: Secondary | ICD-10-CM | POA: Diagnosis not present

## 2015-03-29 DIAGNOSIS — R058 Other specified cough: Secondary | ICD-10-CM

## 2015-03-29 DIAGNOSIS — E669 Obesity, unspecified: Secondary | ICD-10-CM | POA: Diagnosis not present

## 2015-03-29 MED ORDER — MOMETASONE FURO-FORMOTEROL FUM 100-5 MCG/ACT IN AERO: INHALATION_SPRAY | RESPIRATORY_TRACT | Status: DC

## 2015-03-29 MED ORDER — ALBUTEROL SULFATE HFA 108 (90 BASE) MCG/ACT IN AERS
1.0000 | INHALATION_SPRAY | Freq: Four times a day (QID) | RESPIRATORY_TRACT | Status: DC | PRN
Start: 2015-03-29 — End: 2016-08-09

## 2015-03-29 NOTE — Assessment & Plan Note (Signed)
Trial off acei 03/15/2015 due to pseudowheeze > completely resolved  In the best review of chronic cough to date ( NEJM 2016 375 [id number]) ,  ACEi are now felt to cause cough in up to  20% of pts which is a 4 fold increase from previous reports and does not include the variety of non-specific complaints we see in pulmonary clinic in pts on ACEi but previously attributed to copd/asthma to include PNDS, throat and chest congestion, "bronchitis", unexplained dyspnea and noct "strangling" sensations as well as atypical /refractory GERD symptoms like atypical dysphagia.   The result is so convincing here would not resume acei/ listed as allergic .

## 2015-03-29 NOTE — Assessment & Plan Note (Signed)
L eft side-- secondary to radiation/ chemo - pfts pending per DR [place] Bells   Best way to address this is with wt loss > see obesity

## 2015-03-29 NOTE — Assessment & Plan Note (Signed)
Body mass index is 37.28 trending down   Lab Results  Component Value Date   TSH 0.37 01/19/2015     Contributing to gerd tendency/ doe/reviewed the need and the process to achieve and maintain neg calorie balance > defer f/u primary care including intermittently monitoring thyroid status

## 2015-03-29 NOTE — Assessment & Plan Note (Signed)
Completely better off acei and now off dulera and gerd rx   Reviewed the rule of 2s and  Fu can be prn   I had an extended discussion with the patient reviewing all relevant studies completed to date and  lasting 15 to 20 minutes of a 25 minute visit    Each maintenance medication was reviewed in detail including most importantly the difference between maintenance and prns and under what circumstances the prns are to be triggered using an action plan format that is not reflected in the computer generated alphabetically organized AVS.    Please see instructions for details which were reviewed in writing and the patient given a copy highlighting the part that I personally wrote and discussed at today's ov.

## 2015-03-29 NOTE — Patient Instructions (Addendum)
If your breathing worsens or you need to use your rescue inhaler more than twice weekly or wake up more than twice a month with any respiratory symptoms or require more than two rescue inhalers per year, we need to see you right away because this means we're not controlling the underlying problem (inflammation) adequately.  Rescue inhalers do not control inflammation and overuse can lead to unnecessary and costly consequences.  They can make you feel better temporarily but eventually they will quit working effectively much as sleep aids lead to more insomnia if used regularly.   Weight control is simply a matter of calorie balance which needs to be tilted in your favor by eating less and exercising more.  To get the most out of exercise, you need to be continuously aware that you are short of breath, but never out of breath, for 30 minutes daily. As you improve, it will actually be easier for you to do the same amount of exercise  in  30 minutes so always push to the level where you are short of breath.  If this does not result in gradual weight reduction then I strongly recommend you see a nutritionist with a food diary x 2 weeks so that we can work out a negative calorie balance which is universally effective in steady weight loss programs.  Think of your calorie balance like you do your bank account where in this case you want the balance to go down so you must take in less calories than you burn up.  It's just that simple:  Hard to do, but easy to understand.  Good luck!    If you are satisfied with your treatment plan,  let your doctor know and he/she can either refill your medications or you can return here when your prescription runs out.     If in any way you are not 100% satisfied,  please tell us.  If 100% better, tell your friends!  Pulmonary follow up is as needed

## 2015-03-29 NOTE — Progress Notes (Signed)
Subjective:     Patient ID: Ashley Mckee, female   DOB: 1965/06/17      MRN: [id number]    Brief patient profile:  39 yowf quit smoking 2001 due to lymphoma s/p RT to chest and chemo but no resp problems and then autologous BMT and chronic doe attributed to paralyzed L HD in [place] and then acutely worse cough/wheeze/sob > admit to [facility]  Admit date: 03/11/2015 Discharge date: 03/13/2015   Discharge Diagnoses:   Acute hypoxemic respiratory failure ([place])  Hypothyroidism  Diaphragm paralysis  Mediastinal mass  NHL (non-Hodgkin's lymphoma) ([place]), treated with chemo X2 and radiation  Acute respiratory failure with hypoxemia [facility])   Discharge Condition: Stable  Diet recommendation: Carb Modified  Filed Weights   03/12/15 0400 03/12/15 1425 03/13/15 0524  Weight: 106.7 kg (235 lb 3.7 oz) 106.7 kg (235 lb 3.7 oz) 106.278 kg (234 lb 4.8 oz)    History of present illness:  Ashley Mckee is a 49 y.o. female with Past medical history of non-Hodgkin's lymphoma status post chemotherapy 2 as well as radiation therapy treated in [place], diastolic CHF, hypothyroidism, history of autologous bone marrow transplantation 2001. The patient is presenting with numbness of one day history of dry cough as well as shortness of breath. Patient also complains of pleuritic chest pain. Patient did have some fever as well as chills. Patient mentions at work she had one for coworker having similar respiratory symptoms. She mentions that her baseline she does have some orthopnea. But recently her breathing has progressively worsened last night and she couldn't sleep.  Hospital Course:  1. Acute hypoxemic respiratory failure (HCC) , Acute bronchitis with bronchospasm. Probable chronic diastolic dysfunction, PNA; presents with dyspnea, leukocytolysis, mildly elevated lactic acid.  -was cover with Vancomycin, ceftriaxone , azithromycin treatment for PNA. she will be discharge on 3 more days of azithro.   - Continue with Duo nebs.  -sputum culture, blood culture as well as influenza PCR negative - strep negative, legionella urine antigens pending .  -ECHO, normal Ef, no diastolic dysfunction.  Leukocytosis related to steroids.  Discharge today if able to ambulate and no hypoxemia  2.History of non-Hodgkin's lymphoma. Anterior mediastinal mass.,  -Oncologist consulted, CVTS was consulted.  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  Observe per oncologist.   Hypothyroidism Recent TSH was adequate. Continue Synthroid.  Transaminases hepatitis panel pending.   Hyperglycemia; on steroids. Hb A-1c 6.1 suspect Blood sugar will decreased after stoping steroids. Diabetes educator for diet recommendation consulted.   Procedures:  ECHO; normal EF  Consultations:  Oncology  Pulmonary  Discharge Exam: Filed Vitals:   03/13/15 0524  BP: 96/57  Pulse: 81  Temp: 97.3 F (36.3 C)  Resp: 18    General: NAD Cardiovascular: S 1, S 2 RRR Respiratory: crakles bases  Discharge Instructions   Discharge Instructions    Diet Carb Modified  Complete by: As directed      Increase activity slowly  Complete by: As directed           Current Discharge Medication List    START taking these medications   Details  azithromycin (ZITHROMAX) 500 MG tablet Take 1 tablet (500 mg total) by mouth daily. Qty: 2 tablet, Refills: 0    guaiFENesin (MUCINEX) 600 MG 12 hr tablet Take 2 tablets (  1,200 mg total) by mouth 2 (two) times daily. Qty: 30 tablet, Refills: 0      CONTINUE these medications which have NOT CHANGED   Details  albuterol (PROVENTIL HFA;VENTOLIN HFA) 108 (90 BASE)  MCG/ACT inhaler Inhale 1 puff into the lungs every 6 (six) hours as needed for wheezing or shortness of breath.    clotrimazole-betamethasone (LOTRISONE) cream Apply 1 application topically 3 (three) times daily as needed.    SYNTHROID 137 MCG tablet Take 1 tablet (137 mcg total) by mouth daily before breakfast. Qty: 90 tablet, Refills: 3   Associated Diagnoses: Hypothyroidism, unspecified hypothyroidism type      STOP taking these medications     lisinopril (PRINIVIL,ZESTRIL) 10 MG tablet      pramoxine (PROCTOFOAM) 1 % foam           03/15/2015 acute extended ov/Ashley Mckee re: worse cough/sob back on ACEi  Chief Complaint  Patient presents with  . Acute Visit    Hosp f/u. SOB. Pt c/o dry cough, wheeze and SOB since being dc'd. Pt states that she has been using her albuterol HFA 2puffs q6h every day with little to no relief. Pt has no other inhalers.   some better during admit off acei then much worse sob/ dry cough/ immediate strangling sensation when supine so sitting up all night getting no sleep. slt better while on prednisone but maybe only 25%  rec No more lisinopril Diovan 160 mg one daily  Prednisone 10 mg take  4 each am x 2 days,   2 each am x 2 days,  1 each am x 2 days and stop  Try prilosec otc 20mg   Take 30-60 min before first meal of the day and Pepcid ac (famotidine) 20 mg one @  bedtime until cough is completely gone for at least a week without the need for cough suppression dulera 100 Take 2 puffs first thing in am and then another 2 puffs about 12 hours later.  Only use your albuterol as a rescue medication to be used if you can't catch your breath by resting or doing a relaxed purse lip breathing pattern.  - The less you use it, the better it will work when you need it. - Ok to use up to 2 puffs  every 4 hours if you must but call for immediate appointment if use goes up over your usual need - Don't leave home without it !!  (think of it like the  spare tire for your car)  Cough > hycodan cough syrup every 4 hours as needed    03/29/2015  f/u ov/Ashley Mckee re: uacs related to acei  Chief Complaint  Patient presents with  . Follow-up    Pt states her breathing has returned to her normal baseline. She is no longer coughing.  She has not been using albuterol inhaler, and uses Dulera PRN- very rarely.     No regular exercise yet/ rare need for saba/ not using gerd meds and rarely dulera at this point   No obvious patterns in day to day or daytime variability or assoc chronic cough or cp or chest tightness, subjective wheeze or overt sinus or hb symptoms. No unusual exp hx or h/o childhood pna/ asthma or knowledge of premature birth and no prev h/o asthma as adult   Sleeping ok without nocturnal  or early am exacerbation  of respiratory  c/o's or need for noct saba. Also denies any obvious fluctuation of symptoms with weather or environmental changes or  other aggravating or alleviating factors except as outlined above   Current Medications, Allergies, Complete Past Medical History, Past Surgical History, Family History, and Social History were reviewed in [employer] record.  ROS  The following are not active complaints unless bolded sore throat, dysphagia, dental problems, itching, sneezing,  nasal congestion or excess/ purulent secretions, ear ache,   fever, chills, sweats, unintended wt loss, classically pleuritic or exertional cp, hemoptysis,  orthopnea pnd or leg swelling, presyncope, palpitations, abdominal pain, anorexia, nausea, vomiting, diarrhea  or change in bowel or bladder habits, change in stools or urine, dysuria,hematuria,  rash, arthralgias, visual complaints, headache, numbness, weakness or ataxia or problems with walking or coordination,  change in mood/affect or memory.               Objective:   Physical Exam    amb obese wf nad     03/29/2015       224  Wt Readings from Last 3 Encounters:   03/15/15 233 lb (105.688 kg)  03/13/15 234 lb 4.8 oz (106.278 kg)  01/19/15 225 lb 3.2 oz (102.15 kg)    Vital signs reviewed   HEENT: nl dentition, turbinates, and orophanx. Nl external ear canals without cough reflex   NECK :  without JVD/Nodes/TM/ nl carotid upstrokes bilaterally   LUNGS: no acc muscle use, minimal decreased bs L base/ no exp wheeze at all    CV:  RRR  no s3 or murmur or increase in P2, no edema   ABD:  soft and nontender with nl excursion in the supine position. No bruits or organomegaly, bowel sounds nl  MS:  warm without deformities, calf tenderness, cyanosis or clubbing  SKIN: warm and dry without lesions    NEURO:  alert, approp, no deficits       Assessment:

## 2015-04-26 ENCOUNTER — Inpatient Hospital Stay: Payer: 59 | Admitting: Adult Health

## 2015-05-09 ENCOUNTER — Ambulatory Visit: Payer: 59 | Admitting: Internal Medicine

## 2015-06-02 ENCOUNTER — Encounter: Payer: Self-pay | Admitting: Internal Medicine

## 2015-06-02 ENCOUNTER — Ambulatory Visit (INDEPENDENT_AMBULATORY_CARE_PROVIDER_SITE_OTHER): Payer: 59 | Admitting: Internal Medicine

## 2015-06-02 VITALS — BP 124/84 | HR 96 | Ht 66.0 in | Wt 230.0 lb

## 2015-06-02 DIAGNOSIS — I10 Essential (primary) hypertension: Secondary | ICD-10-CM

## 2015-06-02 DIAGNOSIS — R05 Cough: Secondary | ICD-10-CM

## 2015-06-02 DIAGNOSIS — J986 Disorders of diaphragm: Secondary | ICD-10-CM

## 2015-06-02 DIAGNOSIS — R058 Other specified cough: Secondary | ICD-10-CM

## 2015-06-02 LAB — PULMONARY FUNCTION TEST
DL/VA % pred: 129 %
DL/VA: 6.53 ml/min/mmHg/L
DLCO unc % pred: 71 %
DLCO unc: 19.32 ml/min/mmHg
FEF 25-75 Post: 1.24 L/sec
FEF 25-75 Pre: 0.67 L/sec
FEF2575-%CHANGE-POST: 83 %
FEF2575-%PRED-PRE: 22 %
FEF2575-%Pred-Post: 41 %
FEV1-%CHANGE-POST: 19 %
FEV1-%PRED-POST: 43 %
FEV1-%PRED-PRE: 36 %
FEV1-PRE: 1.11 L
FEV1-Post: 1.33 L
FEV1FVC-%CHANGE-POST: 7 %
FEV1FVC-%Pred-Pre: 85 %
FEV6-%Change-Post: 11 %
FEV6-%PRED-PRE: 42 %
FEV6-%Pred-Post: 47 %
FEV6-PRE: 1.59 L
FEV6-Post: 1.78 L
FEV6FVC-%Change-Post: 0 %
FEV6FVC-%PRED-POST: 102 %
FEV6FVC-%PRED-PRE: 101 %
FVC-%CHANGE-POST: 11 %
FVC-%PRED-PRE: 41 %
FVC-%Pred-Post: 46 %
FVC-POST: 1.78 L
FVC-PRE: 1.6 L
POST FEV6/FVC RATIO: 100 %
PRE FEV6/FVC RATIO: 100 %
Post FEV1/FVC ratio: 74 %
Pre FEV1/FVC ratio: 69 %
RV % PRED: 82 %
RV: 1.53 L
TLC % pred: 60 %
TLC: 3.24 L

## 2015-06-02 NOTE — Assessment & Plan Note (Addendum)
Complicated by hbp and ERV 25 on pfts 06/02/2015   Body mass index is 37.14   (no change)  Lab Results  Component Value Date   TSH 0.37 01/19/2015     Contributing to gerd tendency/ doe/reviewed the need and the process to achieve and maintain neg calorie balance > defer f/u primary care including intermittently monitoring thyroid status

## 2015-06-02 NOTE — Patient Instructions (Addendum)
Dulera 100 Take 2 puffs first thing in am and then another 2 puffs about 12 hours later > if can't tell any difference then resume just the albuterol as needed  GERD (REFLUX)  is an extremely common cause of respiratory symptoms just like yours , many times with no obvious heartburn at all.    It can be treated with medication, but also with lifestyle changes including elevation of the head of your bed (ideally with 6 inch  bed blocks),  Smoking cessation, avoidance of late meals, excessive alcohol, and avoid fatty foods, chocolate, peppermint, colas, red wine, and acidic juices such as orange juice.  NO MINT OR MENTHOL PRODUCTS SO NO COUGH DROPS  USE SUGARLESS CANDY INSTEAD (Jolley ranchers or Stover's or Life Savers) or even ice chips will also do - the key is to swallow to prevent all throat clearing. NO OIL BASED VITAMINS - use powdered substitutes.    Weight control is simply a matter of calorie balance which needs to be tilted in your favor by eating less and exercising more.  To get the most out of exercise, you need to be continuously aware that you are short of breath, but never out of breath, for 30 minutes daily. As you improve, it will actually be easier for you to do the same amount of exercise  in  30 minutes so always push to the level where you are short of breath.  If this does not result in gradual weight reduction then I strongly recommend you see a nutritionist with a food diary x 2 weeks so that we can work out a negative calorie balance which is universally effective in steady weight loss programs.  Think of your calorie balance like you do your bank account where in this case you want the balance to go down so you must take in less calories than you burn up.  It's just that simple:  Hard to do, but easy to understand.  Good luck!    If you are satisfied with your treatment plan,  let your doctor know and he/she can either refill your medications or you can return here when your  prescription runs out.     If in any way you are not 100% satisfied,  please tell us.  If 100% better, tell your friends!  Pulmonary follow up is as needed

## 2015-06-02 NOTE — Progress Notes (Signed)
PFT done today. 

## 2015-06-02 NOTE — Progress Notes (Signed)
Subjective:     Patient ID: Ashley Mckee, female   DOB: July 31, 1965      MRN: [id number]    Brief patient profile:  5 yowf quit smoking 2001 due to lymphoma s/p RT to chest and chemo but no resp problems and then autologous BMT and chronic doe attributed to paralyzed L HD in [place] and then acutely worse cough/wheeze/sob > admit to [facility].  Admit date: 03/11/2015 Discharge date: 03/13/2015   Discharge Diagnoses:   Acute hypoxemic respiratory failure ([place])  Hypothyroidism  Diaphragm paralysis  Mediastinal mass  NHL (non-Hodgkin's lymphoma) ([place]), treated with chemo X2 and radiation  Acute respiratory failure with hypoxemia [facility])   Discharge Condition: Stable  Diet recommendation: Carb Modified  Filed Weights   03/12/15 0400 03/12/15 1425 03/13/15 0524  Weight: 106.7 kg (235 lb 3.7 oz) 106.7 kg (235 lb 3.7 oz) 106.278 kg (234 lb 4.8 oz)    History of present illness:  Ashley Mckee is a 50 y.o. female with Past medical history of non-Hodgkin's lymphoma status post chemotherapy 2 as well as radiation therapy treated in [place], diastolic CHF, hypothyroidism, history of autologous bone marrow transplantation 2001. The patient is presenting with numbness of one day history of dry cough as well as shortness of breath. Patient also complains of pleuritic chest pain. Patient did have some fever as well as chills. Patient mentions at work she had one for coworker having similar respiratory symptoms. She mentions that her baseline she does have some orthopnea. But recently her breathing has progressively worsened last night and she couldn't sleep.  Hospital Course:  1. Acute hypoxemic respiratory failure (HCC) , Acute bronchitis with bronchospasm. Probable chronic diastolic dysfunction, PNA; presents with dyspnea, leukocytolysis, mildly elevated lactic acid.  -was cover with Vancomycin, ceftriaxone , azithromycin treatment for PNA. she will be discharge on 3 more days of azithro.   - Continue with Duo nebs.  -sputum culture, blood culture as well as influenza PCR negative - strep negative, legionella urine antigens pending .  -ECHO, normal Ef, no diastolic dysfunction.  Leukocytosis related to steroids.  Discharge today if able to ambulate and no hypoxemia  2.History of non-Hodgkin's lymphoma. Anterior mediastinal mass.,  -Oncologist consulted, CVTS was consulted.  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  Observe per oncologist.   Hypothyroidism Recent TSH was adequate. Continue Synthroid.  Transaminases hepatitis panel pending.   Hyperglycemia; on steroids. Hb A-1c 6.1 suspect Blood sugar will decreased after stoping steroids. Diabetes educator for diet recommendation consulted.   Procedures:  ECHO; normal EF  Consultations:  Oncology  Pulmonary  Discharge Exam: Filed Vitals:   03/13/15 0524  BP: 96/57  Pulse: 81  Temp: 97.3 F (36.3 C)  Resp: 18    General: NAD Cardiovascular: S 1, S 2 RRR Respiratory: crakles bases  Discharge Instructions   Discharge Instructions    Diet Carb Modified  Complete by: As directed      Increase activity slowly  Complete by: As directed           Current Discharge Medication List    START taking these medications   Details  azithromycin (ZITHROMAX) 500 MG tablet Take 1 tablet (500 mg total) by mouth daily. Qty: 2 tablet, Refills: 0    guaiFENesin (MUCINEX) 600 MG 12 hr tablet Take 2 tablets (  1,200 mg total) by mouth 2 (two) times daily. Qty: 30 tablet, Refills: 0      CONTINUE these medications which have NOT CHANGED   Details  albuterol (PROVENTIL HFA;VENTOLIN HFA) 108 (90 BASE)  MCG/ACT inhaler Inhale 1 puff into the lungs every 6 (six) hours as needed for wheezing or shortness of breath.    clotrimazole-betamethasone (LOTRISONE) cream Apply 1 application topically 3 (three) times daily as needed.    SYNTHROID 137 MCG tablet Take 1 tablet (137 mcg total) by mouth daily before breakfast. Qty: 90 tablet, Refills: 3   Associated Diagnoses: Hypothyroidism, unspecified hypothyroidism type      STOP taking these medications     lisinopril (PRINIVIL,ZESTRIL) 10 MG tablet      pramoxine (PROCTOFOAM) 1 % foam           03/15/2015 acute extended ov/[name] re: worse cough/sob back on ACEi  Chief Complaint  Patient presents with  . Acute Visit    Hosp f/u. SOB. Pt c/o dry cough, wheeze and SOB since being dc'd. Pt states that she has been using her albuterol HFA 2puffs q6h every day with little to no relief. Pt has no other inhalers.   some better during admit off acei then much worse sob/ dry cough/ immediate strangling sensation when supine so sitting up all night getting no sleep. slt better while on prednisone but maybe only 25%  rec No more lisinopril Diovan 160 mg one daily  Prednisone 10 mg take  4 each am x 2 days,   2 each am x 2 days,  1 each am x 2 days and stop  Try prilosec otc 20mg   Take 30-60 min before first meal of the day and Pepcid ac (famotidine) 20 mg one @  bedtime until cough is completely gone for at least a week without the need for cough suppression dulera 100 Take 2 puffs first thing in am and then another 2 puffs about 12 hours later.  Only use your albuterol as a rescue medication to be used if you can't catch your breath by resting or doing a relaxed purse lip breathing pattern.  - The less you use it, the better it will work when you need it. - Ok to use up to 2 puffs  every 4 hours if you must but call for immediate appointment if use goes up over your usual need - Don't leave home without it !!  (think of it like the  spare tire for your car)  Cough > hycodan cough syrup every 4 hours as needed    03/29/2015  f/u ov/[name] re: uacs related to acei  Chief Complaint  Patient presents with  . Follow-up    Pt states her breathing has returned to her normal baseline. She is no longer coughing.  She has not been using albuterol inhaler, and uses Dulera PRN- very rarely.    No regular exercise yet/ rare need for saba/ not using gerd meds and rarely dulera at this point  rec Use saba prn     06/02/2015  f/u ov/Emory Gallentine re: uacs/ mild asthma/ obesity  Chief Complaint  Patient presents with  . Follow-up    Review PFT results.     Not limited by breathing from desired activities    No obvious patterns in day to day or daytime variability or assoc chronic cough or cp or chest tightness, subjective wheeze or overt sinus or hb symptoms. No unusual exp hx or h/o childhood pna/ asthma or  knowledge of premature birth and no prev h/o asthma as adult   Sleeping ok without nocturnal  or early am exacerbation  of respiratory  c/o's or need for noct saba. Also denies any obvious fluctuation of symptoms with weather or environmental changes or other aggravating or alleviating factors except as outlined above   Current Medications, Allergies, Complete Past Medical History, Past Surgical History, Family History, and Social History were reviewed in [employer] record.  ROS  The following are not active complaints unless bolded sore throat, dysphagia, dental problems, itching, sneezing,  nasal congestion or excess/ purulent secretions, ear ache,   fever, chills, sweats, unintended wt loss, classically pleuritic or exertional cp, hemoptysis,  orthopnea pnd or leg swelling, presyncope, palpitations, abdominal pain, anorexia, nausea, vomiting, diarrhea  or change in bowel or bladder habits, change in stools or urine, dysuria,hematuria,  rash, arthralgias, visual complaints, headache, numbness, weakness or ataxia or  problems with walking or coordination,  change in mood/affect or memory.               Objective:   Physical Exam    amb obese wf nad     03/29/2015       224 > 06/02/2015  230     03/15/15 233 lb (105.688 kg)  03/13/15 234 lb 4.8 oz (106.278 kg)  01/19/15 225 lb 3.2 oz (102.15 kg)    Vital signs reviewed   HEENT: nl dentition, turbinates, and orophanx. Nl external ear canals without cough reflex   NECK :  without JVD/Nodes/TM/ nl carotid upstrokes bilaterally   LUNGS: no acc muscle use, minimal decreased bs L base/ no exp wheeze     CV:  RRR  no s3 or murmur or increase in P2, no edema   ABD:  soft and nontender with nl excursion in the supine position. No bruits or organomegaly, bowel sounds nl  MS:  warm without deformities, calf tenderness, cyanosis or clubbing  SKIN: warm and dry without lesions    NEURO:  alert, approp, no deficits     I personally reviewed images and agree with radiology impression as follows:  CT Chest   03/11/15 1. Abnormal anterior mediastinal soft tissue which abuts the main pulmonary artery and aortic arch. This causes significant narrowing of the left brachiocephalic vein, with contrast enhanced venous blood diverted into multiple collaterals. There is dense material within the left anterior aspect of this abnormal soft tissue which may reflect contrast or dense calcification. These findings may be from treated lymphoma. Recurrent lymphoma is possible. 2. No evidence of a pulmonary embolus. 3. No acute findings in the lungs. There is presumed radiation induced scarring along the medial aspect of the left and right upper lobes and superior segment left lower lobe.     Assessment:        Outpatient Encounter Prescriptions as of 06/02/2015  Medication Sig  . albuterol (PROVENTIL HFA;VENTOLIN HFA) 108 (90 BASE) MCG/ACT inhaler Inhale 1 puff into the lungs every 6 (six) hours as needed for wheezing or shortness of breath.  Adora Fridge  VAGINAL 0.1 MG/GM vaginal cream   . mometasone-formoterol (DULERA) 100-5 MCG/ACT AERO Take 2 puffs first thing in am and then another 2 puffs about 12 hours later.  Marland Kitchen SYNTHROID 137 MCG tablet Take 1 tablet (137 mcg total) by mouth daily before breakfast.  . valsartan (DIOVAN) 160 MG tablet Take 1 tablet (160 mg total) by mouth daily.   No facility-administered encounter medications on file as of  06/02/2015.         

## 2015-06-04 ENCOUNTER — Encounter: Payer: Self-pay | Admitting: Internal Medicine

## 2015-06-04 NOTE — Assessment & Plan Note (Signed)
Trial off acei 03/15/2015 > all symptoms resolved off all rx 03/29/2015 so rec only resume dulera 100 2bid if symptoms flare again off acei  - PFT's  06/02/2015  FEV1 1.33 (43 % ) ratio 74  p 19 % improvement from saba with DLCO  71 % corrects to 129 % for alv volume   So she does turn out to have a small asthmatic component but most of her problem is restrictive from previous RT and effects of obesity   I had an extended final summary discussion with the patient reviewing all relevant studies completed to date and  lasting 15 to 20 minutes of a 25 minute visit on the following issues:    1) - The proper method of use, as well as anticipated side effects, of a metered-dose inhaler are discussed and demonstrated to the patient. Improved effectiveness after extensive coaching during this visit to a level of approximately 75 % from a baseline of 50 %   2) ok to try dulera 100 2bid to see what difference if any it makes in any of her symptoms with low threshold to d/c  3) Each maintenance medication was reviewed in detail including most importantly the difference between maintenance and as needed and under what circumstances the prns are to be used.  Please see instructions for details which were reviewed in writing and the patient given a copy.

## 2015-06-04 NOTE — Assessment & Plan Note (Signed)
Trial off acei 03/15/2015 due to pseudowheeze > completely resolved 03/29/2015   Adequate control on present rx, reviewed > no change in rx needed  > Follow up per Primary Care planned

## 2015-06-23 MED FILL — VALSARTAN 160 MG TABLET: 160 | 30 days supply | Qty: 30 | Fill #3

## 2015-07-14 MED FILL — VENTOLIN HFA 90 MCG INHALER: 108 (90 BAS | 50 days supply | Qty: 18 | Fill #0

## 2015-07-14 MED FILL — DULERA 100 MCG/5 MCG INH: 100-5 | 30 days supply | Qty: 13 | Fill #0

## 2015-07-26 MED FILL — VALSARTAN 160 MG TABLET: 160 | 30 days supply | Qty: 30 | Fill #4

## 2015-08-24 MED FILL — SYNTHROID 137 MCG TABLET: 137 | 90 days supply | Qty: 90 | Fill #2

## 2015-08-24 MED FILL — VALSARTAN 160 MG TABLET: 160 | 30 days supply | Qty: 30 | Fill #5

## 2015-08-24 MED FILL — DULERA 100 MCG/5 MCG INH: 100-5 | 30 days supply | Qty: 13 | Fill #1

## 2015-08-25 MED FILL — CLOBETASOL 0.05% CREAM: 0.05 | 15 days supply | Qty: 30 | Fill #1

## 2015-09-13 DIAGNOSIS — H52223 Regular astigmatism, bilateral: Secondary | ICD-10-CM | POA: Diagnosis not present

## 2015-09-13 DIAGNOSIS — H5213 Myopia, bilateral: Secondary | ICD-10-CM | POA: Diagnosis not present

## 2015-09-13 DIAGNOSIS — H524 Presbyopia: Secondary | ICD-10-CM | POA: Diagnosis not present

## 2015-09-29 MED FILL — VALSARTAN 160 MG TABLET: 160 | 30 days supply | Qty: 30 | Fill #6

## 2015-10-09 MED FILL — DULERA 100 MCG/5 MCG INH: 100-5 | 30 days supply | Qty: 13 | Fill #2

## 2015-11-08 MED FILL — CLOBETASOL 0.05% CREAM: 0.05 | 15 days supply | Qty: 30 | Fill #2

## 2015-11-08 MED FILL — DIOVAN 160 MG TABLET: 160 | 30 days supply | Qty: 30 | Fill #7

## 2015-12-11 MED FILL — VALSARTAN 160 MG TABLET: 160 | 30 days supply | Qty: 30 | Fill #8

## 2015-12-11 MED FILL — SYNTHROID 137 MCG TABLET: 137 | 90 days supply | Qty: 90 | Fill #3

## 2016-01-03 MED FILL — DULERA 100 MCG/5 MCG INH: 100-5 | 30 days supply | Qty: 13 | Fill #3

## 2016-01-03 MED FILL — VALSARTAN 160 MG TABLET: 160 | 30 days supply | Qty: 30 | Fill #9

## 2016-02-06 ENCOUNTER — Ambulatory Visit (INDEPENDENT_AMBULATORY_CARE_PROVIDER_SITE_OTHER): Payer: 59 | Admitting: Family Medicine

## 2016-02-06 VITALS — BP 122/80 | HR 96 | Temp 98.3°F | Ht 66.0 in | Wt 241.4 lb

## 2016-02-06 DIAGNOSIS — R739 Hyperglycemia, unspecified: Secondary | ICD-10-CM | POA: Diagnosis not present

## 2016-02-06 DIAGNOSIS — R062 Wheezing: Secondary | ICD-10-CM

## 2016-02-06 DIAGNOSIS — E039 Hypothyroidism, unspecified: Secondary | ICD-10-CM | POA: Diagnosis not present

## 2016-02-06 MED ORDER — METHYLPREDNISOLONE ACETATE 40 MG/ML IJ SUSP
40.0000 mg | Freq: Once | INTRAMUSCULAR | Status: AC
  Administered 2016-02-06: 40 mg via INTRAMUSCULAR

## 2016-02-06 MED ORDER — PREDNISONE 10 MG PO TABS: ORAL_TABLET | ORAL | 0 refills | Status: DC

## 2016-02-06 MED ORDER — IPRATROPIUM-ALBUTEROL 0.5-2.5 (3) MG/3ML IN SOLN
3.0000 mL | Freq: Once | RESPIRATORY_TRACT | Status: AC
  Administered 2016-02-06: 3 mL via RESPIRATORY_TRACT

## 2016-02-06 MED FILL — predniSONE 10 MG TABS: 10 | 9 days supply | Qty: 18 | Fill #0

## 2016-02-06 NOTE — Patient Instructions (Signed)
Bronchospasm, Adult  A bronchospasm is a spasm or tightening of the airways going into the lungs. During a bronchospasm breathing becomes more difficult because the airways get smaller. When this happens there can be coughing, a whistling sound when breathing (wheezing), and difficulty breathing. Bronchospasm is often associated with asthma, but not all patients who experience a bronchospasm have asthma.  CAUSES   A bronchospasm is caused by inflammation or irritation of the airways. The inflammation or irritation may be triggered by:   · Allergies (such as to animals, pollen, food, or mold). Allergens that cause bronchospasm may cause wheezing immediately after exposure or many hours later.    · Infection. Viral infections are believed to be the most common cause of bronchospasm.    · Exercise.    · Irritants (such as pollution, cigarette smoke, strong odors, aerosol sprays, and paint fumes).    · Weather changes. Winds increase molds and pollens in the air. Rain refreshes the air by washing irritants out. Cold air may cause inflammation.    · Stress and emotional upset.    SIGNS AND SYMPTOMS   · Wheezing.    · Excessive nighttime coughing.    · Frequent or severe coughing with a simple cold.    · Chest tightness.    · Shortness of breath.    DIAGNOSIS   Bronchospasm is usually diagnosed through a history and physical exam. Tests, such as chest X-rays, are sometimes done to look for other conditions.  TREATMENT   · Inhaled medicines can be given to open up your airways and help you breathe. The medicines can be given using either an inhaler or a nebulizer machine.  · Corticosteroid medicines may be given for severe bronchospasm, usually when it is associated with asthma.  HOME CARE INSTRUCTIONS   · Always have a plan prepared for seeking medical care. Know when to call your health care provider and local emergency services (911 in the [place] Know where you can access local emergency care.  · Only take medicines as  directed by your health care provider.  · If you were prescribed an inhaler or nebulizer machine, ask your health care provider to explain how to use it correctly. Always use a spacer with your inhaler if you were given one.  · It is necessary to remain calm during an attack. Try to relax and breathe more slowly.   · Control your home environment in the following ways:      Change your heating and air conditioning filter at least once a month.      Limit your use of fireplaces and wood stoves.    Do not smoke and do not allow smoking in your home.      Avoid exposure to perfumes and fragrances.      Get rid of pests (such as roaches and mice) and their droppings.      Throw away plants if you see mold on them.      Keep your house clean and dust free.      Replace carpet with wood, tile, or vinyl flooring. Carpet can trap dander and dust.      Use allergy-proof pillows, mattress covers, and box spring covers.      Wash bed sheets and blankets every week in hot water and dry them in a dryer.      Use blankets that are made of polyester or cotton.      Wash hands frequently.  SEEK MEDICAL CARE IF:   · You have muscle aches.    · You have chest pain.    · The sputum changes from clear or   white to yellow, green, gray, or bloody.    · The sputum you cough up gets thicker.    · There are problems that may be related to the medicine you are given, such as a rash, itching, swelling, or trouble breathing.    SEEK IMMEDIATE MEDICAL CARE IF:   · You have worsening wheezing and coughing even after taking your prescribed medicines.    · You have increased difficulty breathing.    · You develop severe chest pain.  MAKE SURE YOU:   · Understand these instructions.  · Will watch your condition.  · Will get help right away if you are not doing well or get worse.     This information is not intended to replace advice given to you by your health care provider. Make sure you discuss any questions you have with your health care  provider.     Document Released: 05/16/2003 Document Revised: 06/03/2014 Document Reviewed: 11/02/2012  Elsevier Interactive Patient Education ©2016 [employer] Inc[employer]

## 2016-02-06 NOTE — Progress Notes (Signed)
Pre visit review using our clinic review tool, if applicable. No additional management support is needed unless otherwise documented below in the visit note. 

## 2016-02-06 NOTE — Progress Notes (Addendum)
Patient ID: Ashley Mckee, female    DOB: 01-Apr-1966  Age: 50 y.o. MRN: [id number]    Subjective:  Subjective  HPI Ashley Mckee presents for bp and thyroid.  Pt is coughing but has an appointment with pulm later this month.    Review of Systems  Constitutional: Negative for appetite change, diaphoresis, fatigue and unexpected weight change.  Eyes: Negative for pain, redness and visual disturbance.  Respiratory: Positive for cough, shortness of breath and wheezing. Negative for chest tightness.   Cardiovascular: Negative for chest pain, palpitations and leg swelling.  Endocrine: Negative for cold intolerance, heat intolerance, polydipsia, polyphagia and polyuria.  Genitourinary: Negative for difficulty urinating, dysuria and frequency.  Neurological: Negative for dizziness, light-headedness, numbness and headaches.    History Past Medical History:  Diagnosis Date  . Autologous bone marrow transplantation status ([place]) 2001  . CHF (congestive heart failure) (HCC)    Last EF was >50%   . Diaphragm dysfunction    Left Side - She thinks this resulted after her chemotherapy.    Marland Kitchen Heart murmur   . 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 Non Hodgkin's lymphoma ([place]) 2000   Non Hodgkin's Lymphoma (Bone Marrow Transplant)  - [place]  . Thyroid disease     She has a past surgical history that includes Tubal ligation; Lymph node biopsy; and Bone marrow transplant.   Her family history includes Diabetes in her father; Hypertension in her father and mother; Stomach cancer in her maternal grandmother.She reports that she quit smoking about 16 years ago. Her smoking use included Cigarettes. She has a 25.50 pack-year smoking history. She does not have any smokeless tobacco history on file. She reports that she drinks alcohol. She reports that she does not use drugs.  Current Outpatient Prescriptions on File Prior to Visit  Medication Sig Dispense Refill  . albuterol  (PROVENTIL HFA;VENTOLIN HFA) 108 (90 BASE) MCG/ACT inhaler Inhale 1 puff into the lungs every 6 (six) hours as needed for wheezing or shortness of breath. 1 Inhaler 1  . ESTRACE VAGINAL 0.1 MG/GM vaginal cream   4  . mometasone-formoterol (DULERA) 100-5 MCG/ACT AERO Take 2 puffs first thing in am and then another 2 puffs about 12 hours later. 1 Inhaler 11  . SYNTHROID 137 MCG tablet Take 1 tablet (137 mcg total) by mouth daily before breakfast. 90 tablet 3  . valsartan (DIOVAN) 160 MG tablet Take 1 tablet (160 mg total) by mouth daily. 30 tablet 11   No current facility-administered medications on file prior to visit.      Objective:  Objective  Physical Exam  Constitutional: She is oriented to person, place, and time. She appears well-developed and well-nourished.  HENT:  Head: Normocephalic and atraumatic.  Eyes: Conjunctivae and EOM are normal.  Neck: Normal range of motion. Neck supple. No JVD present. Carotid bruit is present. No thyromegaly present.  Cardiovascular: Normal rate, regular rhythm and normal heart sounds.   No murmur heard. Pulmonary/Chest: Effort normal. No respiratory distress. She has no wheezes. She has no rales. She exhibits no tenderness.  Musculoskeletal: She exhibits no edema.  Neurological: She is alert and oriented to person, place, and time.  Psychiatric: She has a normal mood and affect.  Nursing note and vitals reviewed.  BP 122/80 (BP Location: Right Arm, Patient Position: Sitting, Cuff Size: Large)   Pulse 96   Temp 98.3 F (36.8 C) (Oral)   Ht 5\' 6"  (1.676 m)   Wt 241 lb  6.4 oz (109.5 kg)   SpO2 97%   BMI 38.96 kg/m  Wt Readings from Last 3 Encounters:  02/06/16 241 lb 6.4 oz (109.5 kg)  06/02/15 230 lb (104.3 kg)  03/29/15 224 lb (101.6 kg)     Lab Results  Component Value Date   WBC 8.2 02/06/2016   HGB 14.0 02/06/2016   HCT 40.9 02/06/2016   PLT 254.0 02/06/2016   GLUCOSE 224 (H) 03/13/2015   CHOL 203 (H) 02/06/2016   TRIG 102.0  02/06/2016   HDL 46.40 02/06/2016   LDLCALC 137 (H) 02/06/2016   ALT 157 (H) 03/13/2015   AST 122 (H) 03/13/2015   NA 138 03/13/2015   K 4.1 03/13/2015   CL 105 03/13/2015   CREATININE 0.79 03/13/2015   BUN 16 03/13/2015   CO2 25 03/13/2015   TSH 6.20 (H) 02/06/2016   INR 1.11 03/12/2015   HGBA1C 5.9 02/06/2016    No results found.   Assessment & Plan:  Plan  I am having Ms. Artiga start on predniSONE. I am also having her maintain her SYNTHROID, ESTRACE VAGINAL, valsartan, mometasone-formoterol, and albuterol. We administered ipratropium-albuterol and methylPREDNISolone acetate.  Meds ordered this encounter  Medications  . ipratropium-albuterol (DUONEB) 0.5-2.5 (3) MG/3ML nebulizer solution 3 mL  . predniSONE (DELTASONE) 10 MG tablet    Sig: 3 po qd for 3 days then 2 po qd for 3 days the 1 po qd for 3 days    Dispense:  18 tablet    Refill:  0  . methylPREDNISolone acetate (DEPO-MEDROL) injection 40 mg    Problem List Items Addressed This Visit      Unprioritized   Hypothyroidism - Primary   Relevant Orders   Thyroid Panel With TSH (Completed)    Other Visit Diagnoses    Hyperglycemia       Relevant Orders   CBC   Lipid panel (Completed)   CBC with Differential/Platelet (Completed)   Hemoglobin A1c (Completed)   Wheezing       Relevant Medications   ipratropium-albuterol (DUONEB) 0.5-2.5 (3) MG/3ML nebulizer solution 3 mL (Completed)   predniSONE (DELTASONE) 10 MG tablet   methylPREDNISolone acetate (DEPO-MEDROL) injection 40 mg (Completed)      Follow-up: No Follow-up on file.  Ann Held, DO

## 2016-02-07 LAB — CBC WITH DIFFERENTIAL/PLATELET
BASOS PCT: 1.9 % (ref 0.0–3.0)
Basophils Absolute: 0.2 10*3/uL — ABNORMAL HIGH (ref 0.0–0.1)
EOS PCT: 2.4 % (ref 0.0–5.0)
Eosinophils Absolute: 0.2 10*3/uL (ref 0.0–0.7)
HCT: 40.9 % (ref 36.0–46.0)
HEMOGLOBIN: 14 g/dL (ref 12.0–15.0)
Lymphocytes Relative: 25.6 % (ref 12.0–46.0)
Lymphs Abs: 2.1 10*3/uL (ref 0.7–4.0)
MCHC: 34.3 g/dL (ref 30.0–36.0)
MCV: 90.7 fl (ref 78.0–100.0)
MONOS PCT: 7 % (ref 3.0–12.0)
Monocytes Absolute: 0.6 10*3/uL (ref 0.1–1.0)
Neutro Abs: 5.1 10*3/uL (ref 1.4–7.7)
Neutrophils Relative %: 63.1 % (ref 43.0–77.0)
Platelets: 254 10*3/uL (ref 150.0–400.0)
RBC: 4.51 Mil/uL (ref 3.87–5.11)
RDW: 14.2 % (ref 11.5–15.5)
WBC: 8.2 10*3/uL (ref 4.0–10.5)

## 2016-02-07 LAB — LIPID PANEL
CHOLESTEROL: 203 mg/dL — AB (ref 0–200)
HDL: 46.4 mg/dL (ref >39.00)
LDL Cholesterol: 137 mg/dL — ABNORMAL HIGH (ref 0–99)
NONHDL: 156.91
Total CHOL/HDL Ratio: 4
Triglycerides: 102 mg/dL (ref 0.0–149.0)
VLDL: 20.4 mg/dL (ref 0.0–40.0)

## 2016-02-07 LAB — THYROID PANEL WITH TSH
FREE THYROXINE INDEX: 3 (ref 1.4–3.8)
T3 Uptake: 29 % (ref 22–35)
T4, Total: 10.3 ug/dL (ref 4.5–12.0)
TSH: 6.2 m[IU]/L — AB

## 2016-02-07 LAB — HEMOGLOBIN A1C: Hgb A1c MFr Bld: 5.9 % (ref 4.6–6.5)

## 2016-02-08 ENCOUNTER — Encounter: Payer: Self-pay | Admitting: Family Medicine

## 2016-02-09 ENCOUNTER — Other Ambulatory Visit: Payer: Self-pay

## 2016-02-09 DIAGNOSIS — E039 Hypothyroidism, unspecified: Secondary | ICD-10-CM

## 2016-02-09 MED ORDER — LEVOTHYROXINE SODIUM 150 MCG PO TABS: 150.0000 ug | ORAL_TABLET | Freq: Every day | ORAL | 0 refills | Status: DC

## 2016-02-12 ENCOUNTER — Other Ambulatory Visit: Payer: Self-pay

## 2016-02-12 MED ORDER — LEVOTHYROXINE SODIUM 150 MCG PO TABS: 150.0000 ug | ORAL_TABLET | Freq: Every day | ORAL | 0 refills | Status: DC

## 2016-02-12 MED FILL — DULERA 100 MCG/5 MCG INH: 100-5 | 30 days supply | Qty: 13 | Fill #4

## 2016-02-12 MED FILL — SYNTHROID 150 MCG TABLET: 150 | 90 days supply | Qty: 90 | Fill #0

## 2016-02-12 MED FILL — VALSARTAN 160 MG TABLET: 160 | 30 days supply | Qty: 30 | Fill #10

## 2016-02-22 ENCOUNTER — Encounter: Payer: Self-pay | Admitting: Pulmonary Disease

## 2016-02-22 ENCOUNTER — Ambulatory Visit (INDEPENDENT_AMBULATORY_CARE_PROVIDER_SITE_OTHER): Payer: 59 | Admitting: Pulmonary Disease

## 2016-02-22 VITALS — BP 108/70 | HR 97 | Ht 65.0 in | Wt 239.0 lb

## 2016-02-22 DIAGNOSIS — R0683 Snoring: Secondary | ICD-10-CM

## 2016-02-22 NOTE — Progress Notes (Signed)
   Subjective:    Patient ID: Ashley Mckee, female    DOB: 17-Aug-1965, 50 y.o.   MRN: [id number]  HPI    Review of Systems  Constitutional: Negative for fever and unexpected weight change.  HENT: Positive for sore throat. Negative for congestion, dental problem, ear pain, nosebleeds, postnasal drip, rhinorrhea, sinus pressure, sneezing and trouble swallowing.   Eyes: Negative for redness and itching.  Respiratory: Positive for cough and shortness of breath. Negative for chest tightness and wheezing.   Cardiovascular: Negative for palpitations and leg swelling.  Gastrointestinal: Negative for nausea and vomiting.  Genitourinary: Negative for dysuria.  Musculoskeletal: Negative for joint swelling.  Skin: Negative for rash.  Neurological: Negative for headaches.  Hematological: Does not bruise/bleed easily.  Psychiatric/Behavioral: Negative for dysphoric mood. The patient is not nervous/anxious.        Objective:   Physical Exam        Assessment & Plan:

## 2016-02-22 NOTE — Progress Notes (Signed)
Past Surgical History She  has a past surgical history that includes Tubal ligation; Lymph node biopsy; and Bone marrow transplant.  Allergies  Allergen Reactions  . Ace Inhibitors Cough    Family History Her family history includes Diabetes in her father; Hypertension in her father and mother; Stomach cancer in her maternal grandmother.  Social History She  reports that she quit smoking about 16 years ago. Her smoking use included Cigarettes. She has a 25.50 pack-year smoking history. She has never used smokeless tobacco. She reports that she drinks alcohol. She reports that she does not use drugs.  Review of systems Constitutional: Negative for fever and unexpected weight change.  HENT: Positive for sore throat. Negative for congestion, dental problem, ear pain, nosebleeds, postnasal drip, rhinorrhea, sinus pressure, sneezing and trouble swallowing.   Eyes: Negative for redness and itching.  Respiratory: Positive for cough and shortness of breath. Negative for chest tightness and wheezing.   Cardiovascular: Negative for palpitations and leg swelling.  Gastrointestinal: Negative for nausea and vomiting.  Genitourinary: Negative for dysuria.  Musculoskeletal: Negative for joint swelling.  Skin: Negative for rash.  Neurological: Negative for headaches.  Hematological: Does not bruise/bleed easily.  Psychiatric/Behavioral: Negative for dysphoric mood. The patient is not nervous/anxious.     Current Outpatient Prescriptions on File Prior to Visit  Medication Sig  . albuterol (PROVENTIL HFA;VENTOLIN HFA) 108 (90 BASE) MCG/ACT inhaler Inhale 1 puff into the lungs every 6 (six) hours as needed for wheezing or shortness of breath.  Adora Fridge VAGINAL 0.1 MG/GM vaginal cream   . levothyroxine (SYNTHROID, LEVOTHROID) 150 MCG tablet Take 1 tablet (150 mcg total) by mouth daily.  . mometasone-formoterol (DULERA) 100-5 MCG/ACT AERO Take 2 puffs first thing in am and then another 2 puffs about 12  hours later.  . valsartan (DIOVAN) 160 MG tablet Take 1 tablet (160 mg total) by mouth daily.   No current facility-administered medications on file prior to visit.     Chief Complaint  Patient presents with  . SLEEP CONSULT    MW patient referred for snoring. 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  Vital signs BP 108/70 (BP Location: Left Arm, Cuff Size: Normal)   Pulse 97   Ht 5\' 5"  (1.651 m)   Wt 239 lb (108.4 kg)   SpO2 98%   BMI 39.77 kg/m   History of Present Illness Ashley Mckee is a 50 y.o. female for evaluation of sleep problems.  She has noticed trouble with her sleep for years.  This has been getting worse, especially after she gained about 20 lbs.  She snores, and will wake up feeling like her throat is closed.  Her husband has told her that she stops breathing while asleep.  She doesn't like to sleep on her back.  She has noticed it is more difficult at times to stay awake while driving home, and has started drinking coffee in the afternoon.  She goes to sleep at 11 pm.  She falls asleep quickly.  She  wakes up a few times to use the bathroom.  She gets out of bed at 540 am on weekdays.  She feels tired in the morning.  She denies morning headache.  She tried tylenol PM >> didn't help her sleep at night, but then she felt too sleepy during the day.  She denies sleep walking, sleep talking, bruxism, or nightmares.  There is no history of restless legs.  She denies  sleep hallucinations, sleep paralysis, or cataplexy.  She has hx of NHL and tx with chemo, XRT, and BMT.  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  The Epworth score is 15 out of 24.   Physical Exam:  General - No distress ENT - No sinus tenderness, no oral exudate, no LAN, no thyromegaly, TM clear, pupils equal/reactive, MP 4, enlarged tongue, scalloped tongue Cardiac - s1s2 regular, 2/6 systolic murmur, pulses symmetric Chest - No wheeze/rales/dullness, good air entry, normal respiratory excursion Back - No focal tenderness Abd - Soft, non-tender, no organomegaly, + bowel sounds Ext - No edema Neuro - Normal strength, cranial nerves intact Skin - No rashes Psych - Normal mood, and behavior  Discussion: She has snoring, sleep disruption, witnessed apnea, and daytime sleepiness.  Her BMI is > 35.  She has hx of hypertension.  I am concerned she could have sleep apnea.  We discussed how sleep apnea can affect various health problems, including risks for hypertension, cardiovascular disease, and diabetes.  We also discussed how sleep disruption can increase risks for accidents, such as while driving.  Weight loss as a means of improving sleep apnea was also reviewed.  Additional treatment options discussed were CPAP therapy, oral appliance, and surgical intervention.   Assessment/plan:  Snoring with concern for obstructive sleep apnea. - will arrange for home sleep study    Patient Instructions  Will arrange for home sleep study Will call to arrange for follow up after sleep study reviewed     Chesley Mires, M.D. Pager [phone] 02/22/2016, 10:08 AM

## 2016-02-22 NOTE — Patient Instructions (Signed)
Will arrange for home sleep study Will call to arrange for follow up after sleep study reviewed  

## 2016-03-07 MED FILL — CLOBETASOL 0.05% CREAM: 0.05 | 10 days supply | Qty: 30 | Fill #0

## 2016-03-07 MED FILL — VENTOLIN HFA 90 MCG INHALER: 108 (90 BAS | 50 days supply | Qty: 18 | Fill #1

## 2016-03-12 DIAGNOSIS — G4733 Obstructive sleep apnea (adult) (pediatric): Secondary | ICD-10-CM | POA: Diagnosis not present

## 2016-03-13 ENCOUNTER — Telehealth: Payer: Self-pay | Admitting: Pulmonary Disease

## 2016-03-13 ENCOUNTER — Encounter: Payer: Self-pay | Admitting: Pulmonary Disease

## 2016-03-13 DIAGNOSIS — G4733 Obstructive sleep apnea (adult) (pediatric): Secondary | ICD-10-CM

## 2016-03-13 HISTORY — DX: Obstructive sleep apnea (adult) (pediatric): G47.33

## 2016-03-13 MED FILL — VALSARTAN 160 MG TABLET: 160 | 30 days supply | Qty: 30 | Fill #11

## 2016-03-13 NOTE — Telephone Encounter (Signed)
HST 03/12/16 >> AHI 33.7, SaO2 low 77%   Will have my nurse inform pt that sleep study shows severe sleep apnea.  Options are 1) CPAP now, 2) ROV first.  If She is agreeable to CPAP, then please send order for auto CPAP range 5 to 15 cm H2O with heated humidity and mask of choice.  Have download sent 1 month after starting CPAP and set up ROV 2 months after starting CPAP.  ROV can be with me or NP.

## 2016-03-15 NOTE — Telephone Encounter (Signed)
Results have been explained to patient, pt expressed understanding. CPAP order placed.  Appt scheduled for 05/31/15 @ 11:30. Nothing further needed.

## 2016-03-18 DIAGNOSIS — G4733 Obstructive sleep apnea (adult) (pediatric): Secondary | ICD-10-CM | POA: Diagnosis not present

## 2016-03-20 ENCOUNTER — Other Ambulatory Visit: Payer: Self-pay | Admitting: *Deleted

## 2016-03-20 DIAGNOSIS — R0683 Snoring: Secondary | ICD-10-CM

## 2016-03-20 NOTE — Progress Notes (Signed)
CLINIC:  Survivorship   REASON FOR VISIT:  Routine follow-up for history of Non-Hodgkin's lymphoma s/p stem cell transplant.    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      09/29/1998 - 02/23/1999 Chemotherapy    She received 8 cycles of CHOP      02/26/1999 Imaging    repeat imaging showed residual mass measured about 6 cm      03/05/1999 - 05/07/1999 Radiation Therapy    She finished consolidation treatment with radiation      07/31/1999 Imaging    Repeat CT scan showed enlarging mass 7 cm maximum dimension      08/06/1999 Procedure    CT guided biopsy non-diagnostic      08/15/1999 Surgery    She had left thoracotomy and biopsy that confirmed disease relapse      08/27/1999 - 11/25/2014 Chemotherapy    She received 4 cycles of R-ICE chemo with marginal response      12/05/1999 - 01/06/2000 Chemotherapy    She was admitted to receive high dose VP-16 for stem cell mobilization      12/05/1999 Bone Marrow Biopsy    BM biopsy was negative      01/10/2000 Bone Marrow Transplant    she underwent autologous stem cell transplant in [place]:  Ashley Mckee presents to the Survivorship Clinic today for routine follow-up for her history of Non-Hodgkin's lymphoma and stem cell transplant in 2001.  Overall, she reports feeling relatively well.  She denies any fever, chills, unexpected weight loss, or adenopathy.  She continues to have shortness of breath at times.  She sees a pulmonologist regularly. She has inhalers to use scheduled, as well as rescue  inhalers. She's not always consistently in using her scheduled inhalers. She was also recently diagnosed with severe sleep apnea. She she tells me she will receive her CPAP machine and supplies sometime next week. She is looking forward to being able to sleep better and feel more rested during the day.   She is discouraged by her weight gain. She does have tells me her Synthroid was recently increased to 150 g. She tells me she tries to eat healthy, but struggles with portions at times. She tries to go to the gym 2-3 times per week, but is not always consistent with this. She does struggle with decreased energy levels as well. She endorses occasional muscle cramps, which she attributes to not being as active as she would like to be.   She reports recent vaginal bleeding. Per her report, she has not had a menstrual cycle since her bone marrow transplant in 2001. She tells me her gynecologist did a hormone blood levels, which showed she was postmenopausal. She tells me that has not seen her gynecologist in quite some time and has not called him to report her recent vaginal bleeding. She sees Dr. Ernestine Conrad at the [facility] practice.  She has been using vaginal estrogen cream for vaginal dryness. She uses this about 2 times per week, but this  is not consistent from week-to-week.   She also reports intermittent bright red blood per rectum, which has been going on for over 1 year. She thinks she has a hemorrhoid. She notices this most often when she is constipated or has large stools. She has not reported this to any of her doctors. She tells me she was supposed to see a GI specialist last year, but became sick with respiratory illness and was subsequently hospitalized; therefore she did not make that appointment.   REVIEW OF SYSTEMS:  Review of Systems  Constitutional: Positive for fatigue. Negative for chills and fever.  HENT:  Negative.   Eyes: Negative.   Respiratory: Positive for shortness of  breath.   Cardiovascular: Negative.   Gastrointestinal: Positive for blood in stool and constipation.  Endocrine: Negative.   Genitourinary: Positive for vaginal bleeding. Negative for dysuria, frequency and hematuria.   Musculoskeletal: Positive for myalgias.  Skin: Negative.   Neurological: Negative.   Hematological: Negative.  Negative for adenopathy.  Psychiatric/Behavioral: Negative.   Breast: Denies any new nodularity, masses, tenderness, nipple changes, or nipple discharge.    A 14-point review of systems was completed and was negative, except as noted above.    PAST MEDICAL/SURGICAL HISTORY:  Past Medical History:  Diagnosis Date  . 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 CHF (congestive heart failure) ([facility])    Last EF was >50%   . Diaphragm dysfunction    Left Side - She thinks this resulted after her chemotherapy.    Marland Kitchen Heart murmur   . 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 Non Hodgkin's lymphoma ([place]) 2000   Non Hodgkin's Lymphoma (Bone Marrow Transplant)  - [place]  . OSA (obstructive sleep apnea) 03/13/2016  . Thyroid disease    Past Surgical History:  Procedure Laterality Date  . BONE MARROW TRANSPLANT    . LYMPH NODE BIOPSY    . TUBAL LIGATION       ALLERGIES:  Allergies  Allergen Reactions  . Ace Inhibitors Cough     CURRENT MEDICATIONS:  Outpatient Encounter Prescriptions as of 03/21/2016  Medication Sig Note  . albuterol (PROVENTIL HFA;VENTOLIN HFA) 108 (90 BASE) MCG/ACT inhaler Inhale 1 puff into the lungs every 6 (six) hours as needed for wheezing or shortness of breath.   Adora Fridge VAGINAL 0.1 MG/GM vaginal cream  03/15/2015: Received from: External Pharmacy  . levothyroxine (SYNTHROID, LEVOTHROID) 150 MCG tablet Take 1 tablet (150 mcg total) by mouth daily.   . mometasone-formoterol (DULERA) 100-5 MCG/ACT AERO Take 2 puffs first thing in am and then another 2 puffs about 12 hours later.   .  valsartan (DIOVAN) 160 MG tablet Take 1 tablet (160 mg total) by mouth daily.    No facility-administered encounter medications on file as of 03/21/2016.      ONCOLOGIC FAMILY HISTORY:  Family History  Problem Relation Age of Onset  . Hypertension Mother   . Diabetes Father   . Hypertension Father   . Stomach cancer Maternal Grandmother     HEALTH MAINTENANCE:  -Last physical with PCP: 12/2014 -Last mammogram: unsure; "it's been a couple of years"  -Last pap smear: 01/19/15, normal  -Vaccinations up-to-date: Annual flu vaccine (2017); pneumococcal vaccine (2016)    SOCIAL HISTORY:  Genette Huertas is married and lives with her husband in [place], [place]. She has 3 daughters, ages 12, 6, & 66. She does not have any grandchildren.  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  Her husband works as a Dealer. She denies any current tobacco or illicit drug use. She is a former smoker.  She drinks alcohol only occasionally.     PHYSICAL EXAMINATION:  Vital Signs: Vitals:   03/21/16 0844  BP: (!) 110/44  Pulse: 80  Resp: 17  Temp: 97.9 F (36.6 C)   Filed Weights   03/21/16 0844  Weight: 242 lb 6.4 oz (110 kg)   General: Well-nourished, well-appearing female in no acute distress. Unaccompanied today. HEENT: Head is normocephalic.  Pupils equal and reactive to light. Conjunctivae clear without exudate.  Sclerae anicteric. Oral mucosa is pink, moist.  Oropharynx is pink without lesions or erythema.  Lymph: No cervical, supraclavicular, infraclavicular, or axillary lymphadenopathy noted on palpation.  Breast Exam:  -Left breast: No palpable masses. No skin redness or dimpling. No nipple inversion or discharge. No axillary adenopathy.  -Right breast: No palpable masses. No skin redness or dimpling. No nipple inversion or discharge. No axillary adenopathy.  Cardiovascular: Regular rate and rhythm. Respiratory: Clear to auscultation bilaterally. Chest  expansion symmetric; breathing non-labored.  GI: Abdomen soft and round; non-tender, non-distended. Bowel sounds normoactive. No hepatosplenomegaly.   GU: Deferred.  Neuro: No focal deficits. Steady gait.  Psych: Mood and affect normal and appropriate for situation.  Extremities: No edema. Skin: Warm and dry.   LABORATORY DATA:  CBC    Component Value Date/Time   WBC 6.4 03/21/2016 0829   WBC 8.2 02/06/2016 1544   RBC 4.23 03/21/2016 0829   RBC 4.51 02/06/2016 1544   HGB 13.0 03/21/2016 0829   HCT 39.1 03/21/2016 0829   PLT 223 03/21/2016 0829   MCV 92.4 03/21/2016 0829   MCH 30.7 03/21/2016 0829   MCH 31.4 03/13/2015 0416   MCHC 33.2 03/21/2016 0829   MCHC 34.3 02/06/2016 1544   RDW 13.9 03/21/2016 0829   LYMPHSABS 1.9 03/21/2016 0829   MONOABS 0.6 03/21/2016 0829   EOSABS 0.1 03/21/2016 0829   BASOSABS 0.0 03/21/2016 0829   CMP Latest Ref Rng & Units 03/21/2016 03/13/2015 03/12/2015  Glucose 70 - 140 mg/dl 117 224(H) 227(H)  BUN 7.0 - 26.0 mg/dL 15.'2 16 9  ' Creatinine 0.6 - 1.1 mg/dL 0.9 0.79 0.79  Sodium 136 - 145 mEq/L 142 138 137  Potassium 3.5 - 5.1 mEq/L 4.8 4.1 4.3  Chloride 101 - 111 mmol/L - 105 104  CO2 22 - 29 mEq/L '29 25 25  ' Calcium 8.4 - 10.4 mg/dL 9.3 8.6(L) 8.0(L)  Total Protein 6.4 - 8.3 g/dL 6.8 6.9 6.7  Total Bilirubin 0.20 - 1.20 mg/dL 0.44 0.5 0.4  Alkaline Phos 40 - 150 U/L 99 77 73  AST 5 - 34 U/L 21 122(H) 62(H)  ALT 0 - 55 U/L 44 157(H) 74(H)    *Labs reviewed in detail with patient during visit today. They are largely stable/within normal limits.     DIAGNOSTIC IMAGING:  None for this visit.     ASSESSMENT AND PLAN:  Ms.. Antonopoulos is a pleasant 50 y.o. female with history of Stage Non-Hodgkin's Lymphoma, diagnosed in 09/1998; initially treated with CHOP chemotherapy x 8 cycles, then consolidation with radiation therapy to mediastinum. In 07/1999, she was found to have relapsed disease and underwent R-ICE chemo x 4 cycles, followed by  high-dose Etoposide for stem cell mobilization.  She went on to have autologous stem cell transplant in 12/1999 at [facility].  AshleyClos presents to the Survivorship Clinic for surveillance and routine follow-up.  1. History of NHL s/p stem cell transplant:  Ms. Jamison is currently clinically and radiographically without evidence of disease or recurrence of cancer. Her laboratory studies are largely within normal limits and stable. She will follow-up in the Survivorship Clinic in 1 year with labs, history, and physical exam per surveillance protocol.  I encouraged her to call me with any questions or concerns before her next visit at the cancer center, and I would be happy to see the patient sooner, if needed.    2. Respiratory issues, stable:  Encouraged her to maintain follow-up with her pulmonology team. Also encouraged her to use her inhalers as prescribed.   3. Abnormal vaginal bleeding: We discussed that given that she is reportedly post-menopausal, any vaginal bleeding at this point would be considered abnormal. We discussed there are benign etiologies of post-menopausal bleeding, but it does warrant additional evaluation to rule out possible malignancy.  I offered to contact Dr. Anitra Lauth at [facility] for the patient, however she declined. She prefers to contact his office at her convenience. I stressed  the importance of her following up on the vaginal bleeding, and encouraged her to call me with any questions.   4. Rectal bleeding: Ms. Alred has had intermittent rectal bleeding for over 1 year. She has not followed up with her GI specialist. Her hemoglobin and hematocrit are stable, which is encouraging. We discussed that she could have a fissure, which is causing some abnormal rectal bleeding. She could potentially have hemorrhoids that are bleeding as well. However, prolonged rectal bleeding warrants additional evaluation. She agreed to have me place a referral to [place], which I have done today. She prefers to have the GI office contact her for the appointment, rather than having [place] make the appointment for her. I strongly recommended that she see the GI specialists soon to have this evaluated, and I defer to their judgment on appropriate imaging, testing, and management.   5. Desire for weight loss:   it is not uncommon for cancer survivors to struggle with weight gain. We discussed different strategies to help her in her weight loss efforts. We discussed the [employer] program, which is a free fitness program through the [employer] designed to help empower survivors in her new lives of physical fitness & wellness.  The program provides survivors with a free 37-monthmembership to the [facility] a structured program designed around goal setting and physical fitness. I shared with Mrs. BHarlow Maresand I think this would be a great option for her in her continued efforts with weight loss. I gave her a brochure today, with instructions on who to contact to get enrolled if she chooses to do so.  6. Cancer screening:  Due to Ms. Scheller's history and age, she should receive screening for skin cancers, breast cancer, colon cancer, and gynecologic cancers. The patient was encouraged to follow-up with her PCP for appropriate cancer screenings.   7. Health maintenance and wellness promotion: Ms. BLawhornwas encouraged to consume 5-7 servings of fruits and vegetables per day. The patient was also encouraged to engage in moderate to vigorous exercise for 30 minutes per day most days of the week. Ms. BKrummelwas instructed to limit her alcohol consumption and continue to abstain from tobacco use.    Dispo:  -Return to cancer center to see Survivorship NP in 02/2017 with labs preceding visit.    A total of 35 minutes of face-to-face time was spent with this patient with greater than  50% of that time in counseling and care-coordination.   Mike Craze, NP Survivorship Program [facility] [phone]   Note: PRIMARY CARE PROVIDER Rosalita Chessman [place], [place] [phone]

## 2016-03-21 ENCOUNTER — Ambulatory Visit (HOSPITAL_BASED_OUTPATIENT_CLINIC_OR_DEPARTMENT_OTHER): Payer: 59 | Admitting: Adult Health

## 2016-03-21 ENCOUNTER — Encounter: Payer: Self-pay | Admitting: Adult Health

## 2016-03-21 ENCOUNTER — Other Ambulatory Visit (HOSPITAL_BASED_OUTPATIENT_CLINIC_OR_DEPARTMENT_OTHER): Payer: 59

## 2016-03-21 VITALS — BP 110/44 | HR 80 | Temp 97.9°F | Resp 17 | Ht 65.0 in | Wt 242.4 lb

## 2016-03-21 DIAGNOSIS — K625 Hemorrhage of anus and rectum: Secondary | ICD-10-CM | POA: Diagnosis not present

## 2016-03-21 DIAGNOSIS — Z9481 Bone marrow transplant status: Secondary | ICD-10-CM

## 2016-03-21 DIAGNOSIS — Z8572 Personal history of non-Hodgkin lymphomas: Secondary | ICD-10-CM

## 2016-03-21 DIAGNOSIS — Z1231 Encounter for screening mammogram for malignant neoplasm of breast: Secondary | ICD-10-CM

## 2016-03-21 LAB — CBC WITH DIFFERENTIAL/PLATELET
BASO%: 0.5 % (ref 0.0–2.0)
BASOS ABS: 0 10*3/uL (ref 0.0–0.1)
EOS ABS: 0.1 10*3/uL (ref 0.0–0.5)
EOS%: 2.2 % (ref 0.0–7.0)
HCT: 39.1 % (ref 34.8–46.6)
HGB: 13 g/dL (ref 11.6–15.9)
LYMPH%: 29.8 % (ref 14.0–49.7)
MCH: 30.7 pg (ref 25.1–34.0)
MCHC: 33.2 g/dL (ref 31.5–36.0)
MCV: 92.4 fL (ref 79.5–101.0)
MONO#: 0.6 10*3/uL (ref 0.1–0.9)
MONO%: 9.8 % (ref 0.0–14.0)
NEUT#: 3.7 10*3/uL (ref 1.5–6.5)
NEUT%: 57.7 % (ref 38.4–76.8)
Platelets: 223 10*3/uL (ref 145–400)
RBC: 4.23 10*6/uL (ref 3.70–5.45)
RDW: 13.9 % (ref 11.2–14.5)
WBC: 6.4 10*3/uL (ref 3.9–10.3)
lymph#: 1.9 10*3/uL (ref 0.9–3.3)

## 2016-03-21 LAB — COMPREHENSIVE METABOLIC PANEL
ALK PHOS: 99 U/L (ref 40–150)
ALT: 44 U/L (ref 0–55)
AST: 21 U/L (ref 5–34)
Albumin: 3.4 g/dL — ABNORMAL LOW (ref 3.5–5.0)
Anion Gap: 8 mEq/L (ref 3–11)
BUN: 15.2 mg/dL (ref 7.0–26.0)
CHLORIDE: 105 meq/L (ref 98–109)
CO2: 29 mEq/L (ref 22–29)
Calcium: 9.3 mg/dL (ref 8.4–10.4)
Creatinine: 0.9 mg/dL (ref 0.6–1.1)
EGFR: 75 mL/min/{1.73_m2} — AB (ref >90)
GLUCOSE: 117 mg/dL (ref 70–140)
POTASSIUM: 4.8 meq/L (ref 3.5–5.1)
SODIUM: 142 meq/L (ref 136–145)
Total Bilirubin: 0.44 mg/dL (ref 0.20–1.20)
Total Protein: 6.8 g/dL (ref 6.4–8.3)

## 2016-03-21 NOTE — Addendum Note (Signed)
Addended by: Holley Bouche on: 03/21/2016 10:21 AM   Modules accepted: Orders

## 2016-03-27 ENCOUNTER — Encounter: Payer: Self-pay | Admitting: Gastroenterology

## 2016-03-28 DIAGNOSIS — G4733 Obstructive sleep apnea (adult) (pediatric): Secondary | ICD-10-CM | POA: Diagnosis not present

## 2016-04-04 DIAGNOSIS — Z6839 Body mass index (BMI) 39.0-39.9, adult: Secondary | ICD-10-CM | POA: Diagnosis not present

## 2016-04-04 DIAGNOSIS — R390 Extravasation of urine: Secondary | ICD-10-CM | POA: Diagnosis not present

## 2016-04-04 DIAGNOSIS — Z01419 Encounter for gynecological examination (general) (routine) without abnormal findings: Secondary | ICD-10-CM | POA: Diagnosis not present

## 2016-04-04 MED FILL — CLOBETASOL 0.05% CREAM: 0.05 | 10 days supply | Qty: 30 | Fill #0

## 2016-04-04 MED FILL — NITROFURANTOIN MONO-MCR 100: 100 | 5 days supply | Qty: 10 | Fill #0

## 2016-04-05 ENCOUNTER — Other Ambulatory Visit (HOSPITAL_COMMUNITY): Payer: Self-pay | Admitting: Obstetrics and Gynecology

## 2016-04-05 DIAGNOSIS — R0989 Other specified symptoms and signs involving the circulatory and respiratory systems: Secondary | ICD-10-CM

## 2016-04-09 ENCOUNTER — Ambulatory Visit (HOSPITAL_COMMUNITY)
Admission: RE | Admit: 2016-04-09 | Discharge: 2016-04-09 | Disposition: A | Discharge status: Home / Self Care | Payer: 59 | Source: Ambulatory Visit | Attending: Family Medicine | Admitting: Family Medicine

## 2016-04-09 DIAGNOSIS — R0989 Other specified symptoms and signs involving the circulatory and respiratory systems: Secondary | ICD-10-CM | POA: Diagnosis not present

## 2016-04-09 DIAGNOSIS — I6523 Occlusion and stenosis of bilateral carotid arteries: Secondary | ICD-10-CM | POA: Diagnosis not present

## 2016-04-09 LAB — VAS US CAROTID
LCCADDIAS: -26 cm/s
LCCADSYS: -70 cm/s
LEFT ECA DIAS: -10 cm/s
LEFT VERTEBRAL DIAS: -10 cm/s
LICADDIAS: -31 cm/s
LICADSYS: -81 cm/s
LICAPSYS: -64 cm/s
Left CCA prox dias: 23 cm/s
Left CCA prox sys: 157 cm/s
Left ICA prox dias: -21 cm/s
RIGHT ECA DIAS: -22 cm/s
RIGHT VERTEBRAL DIAS: -14 cm/s
Right CCA prox dias: 18 cm/s
Right CCA prox sys: 96 cm/s
Right cca dist sys: -70 cm/s

## 2016-04-09 NOTE — Progress Notes (Signed)
**  Preliminary report by tech**  Carotid artery duplex complete. Findings are consistent with a 1-39 percent stenosis involving the right internal carotid artery and the left internal carotid artery. The vertebral arteries demonstrate antegrade flow.   04/09/16 10:37 AM Ashley Mckee RVT

## 2016-04-11 ENCOUNTER — Ambulatory Visit
Admission: RE | Admit: 2016-04-11 | Discharge: 2016-04-11 | Disposition: A | Discharge status: Home / Self Care | Payer: 59 | Source: Ambulatory Visit | Attending: Adult Health | Admitting: Adult Health

## 2016-04-11 ENCOUNTER — Telehealth: Payer: Self-pay | Admitting: Internal Medicine

## 2016-04-11 ENCOUNTER — Other Ambulatory Visit: Payer: Self-pay | Admitting: Internal Medicine

## 2016-04-11 DIAGNOSIS — Z1231 Encounter for screening mammogram for malignant neoplasm of breast: Secondary | ICD-10-CM | POA: Diagnosis not present

## 2016-04-11 MED ORDER — MOMETASONE FURO-FORMOTEROL FUM 100-5 MCG/ACT IN AERO: INHALATION_SPRAY | RESPIRATORY_TRACT | 11 refills | Status: DC

## 2016-04-11 MED FILL — DULERA 100 MCG/5 MCG INH: 100-5 | 30 days supply | Qty: 13 | Fill #0

## 2016-04-11 NOTE — Telephone Encounter (Signed)
lmomtcb x1 

## 2016-04-11 NOTE — Telephone Encounter (Signed)
Called and spoke with pt and she is aware of refill of the dulera has been sent in to the pharmacy.

## 2016-04-11 NOTE — Telephone Encounter (Signed)
Pt returning call.Ashley Mckee ° °

## 2016-04-12 ENCOUNTER — Other Ambulatory Visit: Payer: Self-pay

## 2016-04-12 ENCOUNTER — Telehealth: Payer: Self-pay | Admitting: Family Medicine

## 2016-04-12 MED ORDER — VALSARTAN 160 MG PO TABS: 160.0000 mg | ORAL_TABLET | Freq: Every day | ORAL | 11 refills | Status: DC

## 2016-04-12 MED FILL — VALSARTAN 160 MG TABLET: 160 | 30 days supply | Qty: 30 | Fill #0

## 2016-04-12 NOTE — Telephone Encounter (Signed)
Patient is requesting a refill of valsartan (DIOVAN) 160 MG tablet Please advise. Patient states that Dr. Carollee Herter stated at her last appointment that she would take over the maintenance of this medication.   Patient would also like to know if it is time to repeat her labs? Please advise.    Pharmacy:  [facility], [place]  Patient phone: [phone]

## 2016-04-12 NOTE — Telephone Encounter (Signed)
Spoke with pt to inform her Rx valsartan is sent and ready for pick up at pharmacy. Pt does not need lab work at this time, lab work due in 6 mths per pcp. Pt had no further questions or concerns at this time. LB

## 2016-04-16 ENCOUNTER — Telehealth: Payer: Self-pay | Admitting: Adult Health

## 2016-04-16 NOTE — Telephone Encounter (Signed)
I attempted to reach Ms. Topping to speak with her regarding her screening mammogram results, which are normal.   I was unable to speak with her via phone, but I did leave a detailed message and encouraged her to call me with questions or concerns.    Mike Craze, NP [place] [phone]

## 2016-04-24 DIAGNOSIS — N950 Postmenopausal bleeding: Secondary | ICD-10-CM | POA: Diagnosis not present

## 2016-04-27 DIAGNOSIS — G4733 Obstructive sleep apnea (adult) (pediatric): Secondary | ICD-10-CM | POA: Diagnosis not present

## 2016-05-10 ENCOUNTER — Ambulatory Visit (INDEPENDENT_AMBULATORY_CARE_PROVIDER_SITE_OTHER): Payer: 59 | Admitting: Family Medicine

## 2016-05-10 ENCOUNTER — Encounter: Payer: Self-pay | Admitting: Family Medicine

## 2016-05-10 VITALS — BP 120/80 | HR 85 | Temp 99.0°F | Resp 16 | Ht 65.0 in | Wt 244.8 lb

## 2016-05-10 DIAGNOSIS — R05 Cough: Secondary | ICD-10-CM

## 2016-05-10 DIAGNOSIS — R059 Cough, unspecified: Secondary | ICD-10-CM

## 2016-05-10 DIAGNOSIS — R6883 Chills (without fever): Secondary | ICD-10-CM

## 2016-05-10 DIAGNOSIS — R509 Fever, unspecified: Secondary | ICD-10-CM | POA: Diagnosis not present

## 2016-05-10 DIAGNOSIS — J4521 Mild intermittent asthma with (acute) exacerbation: Secondary | ICD-10-CM | POA: Diagnosis not present

## 2016-05-10 MED ORDER — MOMETASONE FURO-FORMOTEROL FUM 100-5 MCG/ACT IN AERO: INHALATION_SPRAY | RESPIRATORY_TRACT | 11 refills | Status: DC

## 2016-05-10 MED ORDER — MOMETASONE FURO-FORMOTEROL FUM 200-5 MCG/ACT IN AERO: 2.0000 | INHALATION_SPRAY | Freq: Two times a day (BID) | RESPIRATORY_TRACT | 5 refills | Status: DC

## 2016-05-10 MED ORDER — MOMETASONE FURO-FORMOTEROL FUM 200-5 MCG/ACT IN AERO: 2.0000 | INHALATION_SPRAY | Freq: Two times a day (BID) | RESPIRATORY_TRACT | Status: DC

## 2016-05-10 MED ORDER — METHYLPREDNISOLONE ACETATE 80 MG/ML IJ SUSP
80.0000 mg | Freq: Once | INTRAMUSCULAR | Status: AC
  Administered 2016-05-10: 80 mg via INTRAMUSCULAR

## 2016-05-10 MED FILL — DULERA 200 MCG/5 MCG INH: 200-5 | 30 days supply | Qty: 13 | Fill #0

## 2016-05-10 NOTE — Progress Notes (Signed)
Patient ID: Andressa Vester, female    DOB: [birth year]  Age: 50 y.o. MRN: [id number]    Subjective:  Subjective  HPI Geneen Ledesma presents for cough and congestion since Monday am.  + bodyaches, no fever.  + productive cough Pt has been taking Ibuprofen only   Review of Systems  Constitutional: Positive for chills, fatigue and fever. Negative for appetite change, diaphoresis and unexpected weight change.  HENT: Positive for congestion.   Eyes: Negative for pain, redness and visual disturbance.  Respiratory: Negative for cough, chest tightness, shortness of breath and wheezing.   Cardiovascular: Negative for chest pain, palpitations and leg swelling.  Endocrine: Negative for cold intolerance, heat intolerance, polydipsia, polyphagia and polyuria.  Genitourinary: Negative for difficulty urinating, dysuria and frequency.  Neurological: Negative for dizziness, light-headedness, numbness and headaches.    History Past Medical History:  Diagnosis Date  . 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 CHF (congestive heart failure) (HCC)    Last EF was >50%   . Diaphragm dysfunction    Left Side - She thinks this resulted after her chemotherapy.    Marland Kitchen Heart murmur   . 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 Non Hodgkin's lymphoma ([place]) 2000   Non Hodgkin's Lymphoma (Bone Marrow Transplant)  - [place]  . OSA (obstructive sleep apnea) 03/13/2016  . Thyroid disease     She has a past surgical history that includes Tubal ligation; Lymph node biopsy; and Bone marrow transplant.   Her family history includes Diabetes in her father; Hypertension in her father and mother; Stomach cancer in her maternal grandmother.She reports that she quit smoking about 16 years ago. Her smoking use included Cigarettes. She has a 25.50 pack-year smoking history. She has never used smokeless tobacco. She reports that she drinks alcohol. She reports that she does not use  drugs.  Current Outpatient Prescriptions on File Prior to Visit  Medication Sig Dispense Refill  . albuterol (PROVENTIL HFA;VENTOLIN HFA) 108 (90 BASE) MCG/ACT inhaler Inhale 1 puff into the lungs every 6 (six) hours as needed for wheezing or shortness of breath. 1 Inhaler 1  . ESTRACE VAGINAL 0.1 MG/GM vaginal cream   4  . levothyroxine (SYNTHROID, LEVOTHROID) 150 MCG tablet Take 1 tablet (150 mcg total) by mouth daily. 90 tablet 0  . valsartan (DIOVAN) 160 MG tablet Take 1 tablet (160 mg total) by mouth daily. 30 tablet 11   No current facility-administered medications on file prior to visit.      Objective:  Objective  Physical Exam  Constitutional: She is oriented to person, place, and time. She appears well-developed and well-nourished.  HENT:  Head: Normocephalic and atraumatic.  Eyes: Conjunctivae and EOM are normal.  Neck: Normal range of motion. Neck supple. No JVD present. Carotid bruit is present. No thyromegaly present.  Cardiovascular: Normal rate, regular rhythm and normal heart sounds.   No murmur heard. Pulmonary/Chest: Effort normal. No respiratory distress. She has wheezes. She has no rales. She exhibits no tenderness.  Cleared with neb--duoneb  Musculoskeletal: She exhibits no edema.  Neurological: She is alert and oriented to person, place, and time.  Psychiatric: She has a normal mood and affect. Her behavior is normal. Judgment and thought content normal.  Nursing note and vitals reviewed.  BP 120/80 (BP Location: Right Arm, Patient Position: Sitting, Cuff Size: Large)   Pulse 85   Temp 99 F (37.2 C) (Oral)   Resp 16   Ht 5\' 5"  (1.651  m)   Wt 244 lb 12.8 oz (111 kg)   SpO2 98%   BMI 40.74 kg/m  Wt Readings from Last 3 Encounters:  05/10/16 244 lb 12.8 oz (111 kg)  03/21/16 242 lb 6.4 oz (110 kg)  02/22/16 239 lb (108.4 kg)     Lab Results  Component Value Date   WBC 6.4 03/21/2016   HGB 13.0 03/21/2016   HCT 39.1 03/21/2016   PLT 223  03/21/2016   GLUCOSE 117 03/21/2016   CHOL 203 (H) 02/06/2016   TRIG 102.0 02/06/2016   HDL 46.40 02/06/2016   LDLCALC 137 (H) 02/06/2016   ALT 44 03/21/2016   AST 21 03/21/2016   NA 142 03/21/2016   K 4.8 03/21/2016   CL 105 03/13/2015   CREATININE 0.9 03/21/2016   BUN 15.2 03/21/2016   CO2 29 03/21/2016   TSH 6.20 (H) 02/06/2016   INR 1.11 03/12/2015   HGBA1C 5.9 02/06/2016    Mm Screening Breast Tomo Bilateral  Result Date: 04/16/2016 CLINICAL DATA:  Screening. EXAM: 2D DIGITAL SCREENING BILATERAL MAMMOGRAM WITH CAD AND ADJUNCT TOMO COMPARISON:  Previous exam(s). ACR Breast Density Category b: There are scattered areas of fibroglandular density. FINDINGS: There are no findings suspicious for malignancy. Images were processed with CAD. IMPRESSION: No mammographic evidence of malignancy. A result letter of this screening mammogram will be mailed directly to the patient. RECOMMENDATION: Screening mammogram in one year. (Code:SM-B-01Y) BI-RADS CATEGORY  1: Negative. Electronically Signed   By: Ammie Ferrier M.D.   On: 04/16/2016 14:59  Interpretation Summary                        *[facility]*                         [place],  [id number]                            [phone]  ------------------------------------------------------------------- Noninvasive Vascular Lab  Carotid Duplex Study  Patient:    Marqui, Dross MR #:       [id number] Study Date: 04/09/2016 Gender:     F Age:        58 Height:     165.1 cm Weight:     109.9 kg BSA:        2.3 m^2 Pt. Status: Room:   ATTENDING    Default, Provider [phone]  Sara Chu, [place]  REFERRING    [place], [place] R  SONOGRAPHER  Oliver Hum, RVT  Reports also to:  ------------------------------------------------------------------- History and  indications:  History  Diagnostic evaluation. A bruitof the right carotid artery. No prior study is available for comparison.  ------------------------------------------------------------------- Study information:  Study status:  Routine.  Procedure:  The right CCA, right ECA, right ICA, right vertebral, left CCA, left ECA, left ICA, and left vertebral arteries were examined. A vascular evaluation was performed with the patient in the supine position. Image quality was adequate.    Carotid duplex study.     Carotid Duplex exam including  2D imaging, color and spectral Doppler were performed on the extracranial carotid and vertebral arteries using standard established protocols.  Birthdate:  Patient birthdate: 1966-02-11. Age:  Patient is 50 yr old.  Sex:  Gender: female.  Height: Height: 165.1 cm. Height: 65 in.  Weight:  Weight: 109.9 kg. Weight: 241.9 lb.  Body mass index:  BMI: 40.3 kg/m^2.  Body surface area:    BSA: 2.3 m^2.  Study date:  Study date: 04/09/2016. Study time: 10:02 AM.  Location:  Vascular laboratory. Patient status:  Inpatient.  Arterial flow:  +---------------+---------+--------+--------------+---------------+ !Location       !V sys    !V ed    !Flow analysis !Comment        ! +---------------+---------+--------+--------------+---------------+ !Right CCA -    !96 cm/s  !18 cm/s !--------------!Mild intimal   ! !proximal       !         !        !              !thickening.    ! +---------------+---------+--------+--------------+---------------+ !Right CCA -    !96 cm/s  !29 cm/s !--------------!Mild intimal   ! !distal         !         !        !              !thickening.    ! +---------------+---------+--------+--------------+---------------+ !Right ECA      !-113 cm/s!-22 cm/s!--------------!---------------! +---------------+---------+--------+--------------+---------------+ !Right ICA -    !-76 cm/s !-29 cm/s!--------------!Mild intimal    ! !proximal       !         !        !              !thickening.    ! +---------------+---------+--------+--------------+---------------+ !Right ICA -    !-70 cm/s !-27 cm/s!--------------!---------------! !distal         !         !        !              !               ! +---------------+---------+--------+--------------+---------------+ !Right vertebral!-41 cm/s !-14 cm/s!Antegrade flow!---------------! +---------------+---------+--------+--------------+---------------+ !Right          !136 cm/s !--------!--------------!---------------! !subclavian -   !         !        !              !               ! !proximal       !         !        !              !               ! +---------------+---------+--------+--------------+---------------+ !Left CCA -     !157 cm/s !23 cm/s !--------------!Mild intimal   ! !proximal       !         !        !              !thickening.    ! +---------------+---------+--------+--------------+---------------+ !Left CCA -     !-70 cm/s !-26 cm/s!--------------!Mild intimal   ! !distal         !         !        !              !  thickening.    ! +---------------+---------+--------+--------------+---------------+ !Left ECA       !-86 cm/s !-10 cm/s!--------------!---------------! +---------------+---------+--------+--------------+---------------+ !Left ICA -     !-64 cm/s !-21 cm/s!--------------!Mild intimal   ! !proximal       !         !        !              !thickening.    ! +---------------+---------+--------+--------------+---------------+ !Left ICA -     !-81 cm/s !-31 cm/s!--------------!---------------! !distal         !         !        !              !               ! +---------------+---------+--------+--------------+---------------+ !Left vertebral !-39 cm/s !-10 cm/s!Antegrade flow!Abnormal       ! !               !         !        !              !waveform.      ! +---------------+---------+--------+--------------+---------------+ !Left  subclavian!171 cm/s !--------!--------------!---------------! !- proximal     !         !        !              !               ! +---------------+---------+--------+--------------+---------------+  ------------------------------------------------------------------- Summary: Findings are consistent with a 1-39 percent stenosis involving the right internal carotid artery and the left internal carotid artery. The vertebral arteries demonstrate antegrade flow. The left vertebral artery demonstrates an abnormal waveform.  Other specific details can be found in the table(s) above. Prepared and Electronically Authenticated by  Gae Gallop MD 2017-11-14T17:05:07  Study Information   Physician Technologist Supporting Staff   Legrand Como, RVT   Medical History      Assessment & Plan:  Plan  I am having Ms. Gayton start on mometasone-formoterol. I am also having her maintain her ESTRACE VAGINAL, albuterol, levothyroxine, valsartan, and mometasone-formoterol. We administered methylPREDNISolone acetate. We will continue to administer mometasone-formoterol.  Meds ordered this encounter  Medications  . DISCONTD: mometasone-formoterol (DULERA) 200-5 MCG/ACT inhaler 2 puff  . mometasone-formoterol (DULERA) 200-5 MCG/ACT inhaler 2 puff  . mometasone-formoterol (DULERA) 100-5 MCG/ACT AERO    Sig: Take 2 puffs first thing in am and then another 2 puffs about 12 hours later.    Dispense:  1 Inhaler    Refill:  11  . DISCONTD: mometasone-formoterol (DULERA) 100-5 MCG/ACT AERO    Sig: Take 2 puffs first thing in am and then another 2 puffs about 12 hours later.    Dispense:  1 Inhaler    Refill:  11  . mometasone-formoterol (DULERA) 200-5 MCG/ACT AERO    Sig: Inhale 2 puffs into the lungs 2 (two) times daily.    Dispense:  1 Inhaler    Refill:  5  . methylPREDNISolone acetate (DEPO-MEDROL) injection 80 mg    Problem List Items Addressed This Visit      Unprioritized   Mild  intermittent asthma with acute exacerbation    Inc dulera to 200  Depo medrol cxr rto prn      Relevant Medications   mometasone-formoterol (DULERA) 200-5 MCG/ACT inhaler 2 puff   mometasone-formoterol (DULERA) 100-5  MCG/ACT AERO   mometasone-formoterol (DULERA) 200-5 MCG/ACT AERO   methylPREDNISolone acetate (DEPO-MEDROL) injection 80 mg (Completed)    Other Visit Diagnoses    Chills    -  Primary   Relevant Orders   POC Influenza A&B (Binax test)   Low grade fever       Relevant Orders   POC Influenza A&B (Binax test)   Cough       Relevant Orders   DG Chest 2 View    inc dulera to 200 Depo medrol given con't albuterol and otc cough meds rto prn  Follow-up: No Follow-up on file.  Ann Held, DO

## 2016-05-10 NOTE — Patient Instructions (Signed)

## 2016-05-10 NOTE — Progress Notes (Signed)
Pre visit review using our clinic review tool, if applicable. No additional management support is needed unless otherwise documented below in the visit note. 

## 2016-05-12 ENCOUNTER — Encounter: Payer: Self-pay | Admitting: Family Medicine

## 2016-05-12 DIAGNOSIS — J4521 Mild intermittent asthma with (acute) exacerbation: Secondary | ICD-10-CM | POA: Insufficient documentation

## 2016-05-12 NOTE — Assessment & Plan Note (Signed)
Inc dulera to 200  Depo medrol cxr rto prn

## 2016-05-13 LAB — POC INFLUENZA A&B (BINAX/QUICKVUE): Influenza A, POC: NEGATIVE

## 2016-05-21 ENCOUNTER — Other Ambulatory Visit: Payer: Self-pay | Admitting: Family Medicine

## 2016-05-21 MED FILL — SYNTHROID 150 MCG TABLET: 150 | 90 days supply | Qty: 90 | Fill #0

## 2016-05-21 MED FILL — VALSARTAN 160 MG TABLET: 160 | 90 days supply | Qty: 90 | Fill #1

## 2016-05-28 DIAGNOSIS — G4733 Obstructive sleep apnea (adult) (pediatric): Secondary | ICD-10-CM | POA: Diagnosis not present

## 2016-05-30 ENCOUNTER — Ambulatory Visit (INDEPENDENT_AMBULATORY_CARE_PROVIDER_SITE_OTHER): Payer: 59 | Admitting: Pulmonary Disease

## 2016-05-30 ENCOUNTER — Encounter: Payer: Self-pay | Admitting: Pulmonary Disease

## 2016-05-30 VITALS — BP 108/70 | HR 91 | Ht 65.0 in | Wt 241.4 lb

## 2016-05-30 DIAGNOSIS — G4733 Obstructive sleep apnea (adult) (pediatric): Secondary | ICD-10-CM

## 2016-05-30 DIAGNOSIS — Z9989 Dependence on other enabling machines and devices: Secondary | ICD-10-CM | POA: Diagnosis not present

## 2016-05-30 NOTE — Patient Instructions (Signed)
Will have [facility] adjust your CPAP setting  Follow up in 1 year

## 2016-05-30 NOTE — Progress Notes (Signed)
  Current Outpatient Prescriptions on File Prior to Visit  Medication Sig  . albuterol (PROVENTIL HFA;VENTOLIN HFA) 108 (90 BASE) MCG/ACT inhaler Inhale 1 puff into the lungs every 6 (six) hours as needed for wheezing or shortness of breath.  Adora Fridge VAGINAL 0.1 MG/GM vaginal cream   . mometasone-formoterol (DULERA) 100-5 MCG/ACT AERO Take 2 puffs first thing in am and then another 2 puffs about 12 hours later.  . mometasone-formoterol (DULERA) 200-5 MCG/ACT AERO Inhale 2 puffs into the lungs 2 (two) times daily. (Patient taking differently: Inhale 2 puffs into the lungs 2 (two) times daily as needed (with increased breathin issues). )  . SYNTHROID 150 MCG tablet TAKE 1 TABLET BY MOUTH ONCE DAILY  . valsartan (DIOVAN) 160 MG tablet Take 1 tablet (160 mg total) by mouth daily.   No current facility-administered medications on file prior to visit.     Chief Complaint  Patient presents with  . Follow-up    Wears CPAP nightly. Great results. Denies problems with pressure. Pt started with the AirFit P10 Nasal Pillow System for Her and d/t not being comfortable she switched to a nasal mask. Pt is still not sure which she prefers, mask or pillows. DME: [facility]    Sleep tests HST 03/12/16 >> AHI 33.7, SaO2 low 77% Auto CPAP 04/29/16 to 05/28/16 >> used on 30 of 30 nights with average 6 hrs 52 min.  Average AHI 1 with median CPAP 14 and 95 th percentile CPAP 15 cm H2O  Pulmonary tests CT angio chest 03/20/15 >> post XRT changes LUL/LLL and RUL, calcified granulomas RUL/LUL/LLL PFT 06/02/15 >> FEV1 1.33 (43%), FEV1% 74, TLC 3.24 (60%), DLCO 71%, +BD  Cardiac tests Echo 03/12/15 >> EF 50 to 55%, mild MR  Past medical history NHL s/p BMT, Mitral regurgitation, Asthma, Hypothyroidism  Past surgical history, Family history, Social history, Allergies reviewed  Vital signs BP 108/70 (BP Location: Right Arm, Cuff Size: Normal)   Pulse 91   Ht 5\' 5"  (1.651 m)   Wt 241 lb 6.4 oz (109.5 kg)   SpO2 95%    BMI 40.17 kg/m   History of Present Illness Ashley Mckee is a 51 y.o. female obstructive sleep apnea.  Her sleep study showed severe sleep apnea.  She was started on auto CPAP.  She tried nasal pillow, but feels that nasal mask fits better.  She has a pore on side of her nose that gets irritated from mask.  She is sleeping better, and feels much better since starting CPAP.  She feels that her pressure setting should be higher at times.  Physical Exam:  General - No distress ENT - No sinus tenderness, no oral exudate, no LAN, no thyromegaly, TM clear, pupils equal/reactive, MP 4, enlarged tongue, scalloped tongue Cardiac - s1s2 regular, 2/6 systolic murmur, pulses symmetric Chest - No wheeze/rales/dullness, good air entry, normal respiratory excursion Back - No focal tenderness Abd - Soft, non-tender, no organomegaly, + bowel sounds Ext - No edema Neuro - Normal strength, cranial nerves intact Skin - No rashes Psych - Normal mood, and behavior   Assessment/plan:  Obstructive sleep apnea. - she is compliant with CPAP and reports benefit - will change her auto CPAP range to 10 - 20 cm H2O   Patient Instructions  Will [facility] your [facility]AP setting  Follow up in 1 year    Chesley Mires, M.D. Pager [phone] 05/30/2016, 12:03 PM

## 2016-05-31 ENCOUNTER — Encounter: Payer: Self-pay | Admitting: Gastroenterology

## 2016-05-31 ENCOUNTER — Ambulatory Visit (INDEPENDENT_AMBULATORY_CARE_PROVIDER_SITE_OTHER): Payer: 59 | Admitting: Gastroenterology

## 2016-05-31 VITALS — BP 104/72 | HR 96 | Ht 65.0 in | Wt 240.1 lb

## 2016-05-31 DIAGNOSIS — K602 Anal fissure, unspecified: Secondary | ICD-10-CM | POA: Diagnosis not present

## 2016-05-31 DIAGNOSIS — K5900 Constipation, unspecified: Secondary | ICD-10-CM

## 2016-05-31 DIAGNOSIS — K625 Hemorrhage of anus and rectum: Secondary | ICD-10-CM | POA: Diagnosis not present

## 2016-05-31 DIAGNOSIS — R7401 Elevation of levels of liver transaminase levels: Secondary | ICD-10-CM

## 2016-05-31 DIAGNOSIS — R740 Nonspecific elevation of levels of transaminase and lactic acid dehydrogenase [LDH]: Secondary | ICD-10-CM

## 2016-05-31 MED ORDER — NONFORMULARY OR COMPOUNDED ITEM: 1 refills | Status: DC

## 2016-05-31 MED ORDER — NA SULFATE-K SULFATE-MG SULF 17.5-3.13-1.6 GM/177ML PO SOLN: 1.0000 | Freq: Once | ORAL | 0 refills | Status: AC

## 2016-05-31 NOTE — Patient Instructions (Signed)
If you are age 49 or older, your body mass index should be between 23-30. Your Body mass index is 39.96 kg/m. If this is out of the aforementioned range listed, please consider follow up with your Primary Care Provider.  If you are age 54 or younger, your body mass index should be between 19-25. Your Body mass index is 39.96 kg/m. If this is out of the aformentioned range listed, please consider follow up with your Primary Care Provider.   We have sent the following medications to your pharmacy for you to pick up at your convenience:  Suprep  Nitroglycerin Ointment  Please purchase Miralax over the counter and use as directed.  Your physician has requested that you go to the basement for the following lab work before leaving today:  Hepatic Panel  You have been scheduled for a colonoscopy. Please follow written instructions given to you at your visit today.  Please pick up your prep supplies at the pharmacy within the next 1-3 days. If you use inhalers (even only as needed), please bring them with you on the day of your procedure. Your physician has requested that you go to www.[email or web address].com and enter the access code given to you at your visit today. This web site gives a general overview about your procedure. However, you should still follow specific instructions given to you by our office regarding your preparation for the procedure.

## 2016-05-31 NOTE — Progress Notes (Signed)
HPI :  51 y/o female here for evaluation of blood in the stools. She has had lymphoma diagnosed in 2000 and had chemo and XRT. She had recurrence of it and had a bone marrow transplant in 2001. No recurrence since that time. History of OSA, and uses CPAP. She reports a history of CHF, she thinks thought to be related to her prior cancer therapy. Echo in 02/2015 showed EF of 55%.   She reports symptoms of rectal bleeding ongoing for a few years, perhaps longer. She has thought it has been mostly due to hemorrhoids. She has usually sees a small amount of red blood, and other times she has had significant rectal bleeding. She thinks currently she is seeing blood in about 25% of her stools. She endorses some pain in the anal area with passing stools she sees the blood. She has one BM per day. She is passing hard stools and straining. Not taking anything for it. No abdominal pains with this. No weight loss. No prior colonoscopy. Grandmother had colon cancer. She also endorses some perianal pruritus after bowel movement.    Labs from October showed no evidence of anemia. She has a history of elevated LFTs with ALT up to 150s when hospitalized, last checked in October and ALT was 44. She has had negative viral hep testing   Past Medical History:  Diagnosis Date  . 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 CHF (congestive heart failure) (HCC)    Last EF was >50%   . Diaphragm dysfunction    Left Side - She thinks this resulted after her chemotherapy.    Marland Kitchen Heart murmur   . 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 Non Hodgkin's lymphoma ([place]) 2000   Non Hodgkin's Lymphoma (Bone Marrow Transplant)  - [place]  . OSA (obstructive sleep apnea) 03/13/2016  . Thyroid disease      Past Surgical History:  Procedure Laterality Date  . BONE MARROW TRANSPLANT    . HYSTEROTOMY  2014  . LYMPH NODE BIOPSY    . TUBAL LIGATION     Family History  Problem Relation  Age of Onset  . Hypertension Mother   . Diabetes Father   . Hypertension Father   . Colon cancer Maternal Grandmother   . Stomach cancer Other     maternal great grandmother  . Esophageal cancer Neg Hx   . Rectal cancer Neg Hx   . Liver cancer Neg Hx    Social History  Substance Use Topics  . Smoking status: Former Smoker    Packs/day: 1.50    Years: 17.00    Types: Cigarettes    Quit date: 05/28/1999  . Smokeless tobacco: Never Used  . Alcohol use Yes     Comment: Rarely    Current Outpatient Prescriptions  Medication Sig Dispense Refill  . albuterol (PROVENTIL HFA;VENTOLIN HFA) 108 (90 BASE) MCG/ACT inhaler Inhale 1 puff into the lungs every 6 (six) hours as needed for wheezing or shortness of breath. 1 Inhaler 1  . mometasone-formoterol (DULERA) 100-5 MCG/ACT AERO Take 2 puffs first thing in am and then another 2 puffs about 12 hours later. 1 Inhaler 11  . mometasone-formoterol (DULERA) 200-5 MCG/ACT AERO Inhale 2 puffs into the lungs 2 (two) times daily. (Patient taking differently: Inhale 2 puffs into the lungs as needed (with increased breathin issues). ) 1 Inhaler 5  . SYNTHROID 150 MCG tablet TAKE 1 TABLET BY MOUTH ONCE DAILY 90 tablet  0  . triamcinolone cream (KENALOG) 0.1 % Apply 1 application topically daily. PT NOT CERTAIN THIS IS THE CREAM SHE IS USING    . valsartan (DIOVAN) 160 MG tablet Take 1 tablet (160 mg total) by mouth daily. 30 tablet 11  . ESTRACE VAGINAL 0.1 MG/GM vaginal cream   4   No current facility-administered medications for this visit.    Allergies  Allergen Reactions  . Ace Inhibitors Cough     Review of Systems: All systems reviewed and negative except where noted in HPI.   Lab Results  Component Value Date   WBC 6.4 03/21/2016   HGB 13.0 03/21/2016   HCT 39.1 03/21/2016   MCV 92.4 03/21/2016   PLT 223 03/21/2016    Lab Results  Component Value Date   ALT 44 03/21/2016   AST 21 03/21/2016   ALKPHOS 99 03/21/2016   BILITOT 0.44  03/21/2016     Physical Exam: BP 104/72   Pulse 96   Ht 5\' 5"  (1.651 m)   Wt 240 lb 2 oz (108.9 kg)   BMI 39.96 kg/m  Constitutional: Pleasant,well-developed, female in no acute distress. HEENT: Normocephalic and atraumatic. Conjunctivae are normal. No scleral icterus. Neck supple.  Cardiovascular: Normal rate, regular rhythm.  Pulmonary/chest: Effort normal and breath sounds normal. No wheezing, rales or rhonchi. Abdominal: Soft, nondistended, nontender.  There are no masses palpable. No hepatomegaly.  DRE - anal fissure on posterior and anterior midline anal canal, no mass lesions noted. Anoscopy not performed due to fissures noted on exam. Extremities: no edema Lymphadenopathy: No cervical adenopathy noted. Neurological: Alert and oriented to person place and time. Skin: Skin is warm and dry. No rashes noted. Psychiatric: Normal mood and affect. Behavior is normal.   ASSESSMENT AND PLAN: 51 year old female here for assessment of the following issues:  Rectal bleeding / anal fissure / constipation - long-standing intermittent rectal bleeding accompanied with anal discomfort. No anemia. On rectal exam she has 2 anal fissures noted today. Anoscopy was not performed in this light of the she would tolerate it. While I suspect she is likely having symptoms due to fissure, given the duration of her symptoms I'm recommending a colonoscopy to ensure no bleeding polyp or mass lesion. Hemorrhoids also remain possible, we'll assess for this with colonoscopy after anal fissure has healed. For anal fissure recommend topical 0.125% nitroglycerin ointment applied within the anal canal 3 times a day. For her constipation recommend starting MiraLAX daily and titrating up as needed to keep stool soft and minimize straining. I discussed risks and benefits of colonoscopy with her and she wished proceed. She agreed with the plan.   Elevated ALT - noted previously was hospitalized, although when last  checked ALT had reduced to 44. We'll recheck LFTs to ensure stable / normalized. If they remain elevated we'll further workup. Prior hepatitis panel negative. She agreed  Braintree Cellar, MD [facility] Pager [phone]  CC: Carollee Herter, Alferd Apa, *

## 2016-06-03 ENCOUNTER — Encounter: Payer: Self-pay | Admitting: Gastroenterology

## 2016-06-03 ENCOUNTER — Other Ambulatory Visit (INDEPENDENT_AMBULATORY_CARE_PROVIDER_SITE_OTHER): Payer: 59

## 2016-06-03 ENCOUNTER — Telehealth: Payer: Self-pay | Admitting: Gastroenterology

## 2016-06-03 DIAGNOSIS — K5900 Constipation, unspecified: Secondary | ICD-10-CM | POA: Diagnosis not present

## 2016-06-03 DIAGNOSIS — R740 Nonspecific elevation of levels of transaminase and lactic acid dehydrogenase [LDH]: Secondary | ICD-10-CM

## 2016-06-03 DIAGNOSIS — K602 Anal fissure, unspecified: Secondary | ICD-10-CM | POA: Diagnosis not present

## 2016-06-03 DIAGNOSIS — K625 Hemorrhage of anus and rectum: Secondary | ICD-10-CM

## 2016-06-03 DIAGNOSIS — R7401 Elevation of levels of liver transaminase levels: Secondary | ICD-10-CM

## 2016-06-03 LAB — HEPATIC FUNCTION PANEL
ALT: 26 U/L (ref 0–35)
AST: 21 U/L (ref 0–37)
Albumin: 4.1 g/dL (ref 3.5–5.2)
Alkaline Phosphatase: 77 U/L (ref 39–117)
Bilirubin, Direct: 0.1 mg/dL (ref 0.0–0.3)
Total Bilirubin: 0.7 mg/dL (ref 0.2–1.2)
Total Protein: 7.4 g/dL (ref 6.0–8.3)

## 2016-06-03 NOTE — Telephone Encounter (Signed)
I sent Ointment to them by mistake. Ointment was sent to [facility] to compound. Pt informed at appt.

## 2016-06-04 NOTE — Progress Notes (Signed)
Letter mailed

## 2016-06-05 NOTE — H&P (Signed)
Patient name Ashley Mckee, Ashley Mckee [id number] CSN# [id number]  Darlyn Chamber, MD 06/05/2016 12:50 PM

## 2016-06-06 NOTE — H&P (Unsigned)
NAME:  Ashley Mckee, Ashley Mckee NO.:  [id number]  MEDICAL RECORD NO.:  [id number]  LOCATION:                                 FACILITY:  PHYSICIAN:  Darlyn Chamber, M.D.   DATE OF BIRTH:  [birth year]  DATE OF ADMISSION: DATE OF DISCHARGE:                             HISTORY & PHYSICAL   HISTORY OF PRESENT ILLNESS:  The patient is a 51 year old postmenopausal patient who comes in for hysteroscopy along with MyoSure.  She had a saline infusion ultrasound that showed a thickened endometrium.  Saline infusion revealed a polypoid mass with endometrial cavity.  So, she now comes in for hysteroscopy with MyoSure resection of apparent endometrial polyp.  ALLERGIES:  She is allergic to antihypertensives in the form of ACE inhibitors.  MEDICATIONS:  She is on Synthroid 150 mcg per day, valsartan, and Dulera.  PAST MEDICAL HISTORY:  Significant in that she has a history of a paralyzed diaphragm and evidence of mild cardiomegaly on echocardiogram. She has also history of hypothyroidism.  In terms of operative procedure, she has had a previous bilateral tubal ligation.  She had a bone marrow transplant for non-Hodgkin's lymphoma.  She has had three vaginal deliveries and one abortion.  SOCIAL HISTORY:  Reveals no tobacco or alcohol use.  FAMILY HISTORY:  Noncontributory.  REVIEW OF SYSTEMS:  Noncontributory.  PHYSICAL EXAM:  VITAL SIGNS:  The patient is afebrile with stable vital signs. HEENT:  The patient is normocephalic.  Pupils are equal, round, and reactive to light and accommodation.  Extraocular movements were intact. Sclerae and conjunctivae clear.  Oropharynx is clear. NECK:  Without thyromegaly. LUNGS:  Clear. CARDIOVASCULAR:  Regular rate.  No murmurs or gallops.  No carotid or abdominal bruits. ABDOMEN:  Benign.  No mass, organomegaly or tenderness. PELVIC:  Normal external genitalia.  Vaginal mucosa is clear.  Cervix unremarkable.  Uterus normal size, shape,  and contour.  Adnexa free of mass or tenderness. EXTREMITIES:  Trace edema. NEUROLOGIC:  Grossly within normal limits.  IMPRESSION: 1. Postmenopausal bleeding with endometrial polyp. 2. 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 3. History of Hodgkin lymphoma. 4. History of paralyzed diaphragm. 5. Hypothyroidism. 6. Hypertension.  PLAN:  At the present time, the patient will undergo hysteroscopy with MyoSure and dilation and curettage.  The nature of the procedure had been discussed.  The risks have been explained including the risk of infection.  The risk of hemorrhage that could require transfusion with the risk of AIDS or hepatitis.  Excessive bleeding could require hysterectomy.  There is a risk of perforation with injury to adjacent organs requiring laparoscopy and possible exploratory surgery.  Risk of deep venous thrombosis and pulmonary embolus.  The patient expressed understanding of potential risks and complications as well as indications.     Darlyn Chamber, M.D.     JSM/MEDQ  D:  06/05/2016  T:  06/06/2016  Job:  [id number]

## 2016-06-11 ENCOUNTER — Encounter (HOSPITAL_COMMUNITY): Payer: Self-pay

## 2016-06-11 ENCOUNTER — Encounter: Payer: Self-pay | Admitting: Surgery

## 2016-06-11 ENCOUNTER — Encounter (HOSPITAL_COMMUNITY)
Admission: RE | Admit: 2016-06-11 | Discharge: 2016-06-11 | Disposition: A | Discharge status: Home / Self Care | Payer: 59 | Source: Ambulatory Visit | Attending: Obstetrics and Gynecology | Admitting: Obstetrics and Gynecology

## 2016-06-11 DIAGNOSIS — Z01818 Encounter for other preprocedural examination: Secondary | ICD-10-CM | POA: Diagnosis not present

## 2016-06-11 DIAGNOSIS — Z01812 Encounter for preprocedural laboratory examination: Secondary | ICD-10-CM | POA: Insufficient documentation

## 2016-06-11 HISTORY — DX: Hypothyroidism, unspecified: E03.9

## 2016-06-11 HISTORY — DX: Dyspnea, unspecified: R06.00

## 2016-06-11 HISTORY — DX: Disorders of diaphragm: J98.6

## 2016-06-11 HISTORY — DX: Unspecified asthma, uncomplicated: J45.909

## 2016-06-11 LAB — BASIC METABOLIC PANEL
Anion gap: 9 (ref 5–15)
BUN: 19 mg/dL (ref 6–20)
CHLORIDE: 99 mmol/L — AB (ref 101–111)
CO2: 30 mmol/L (ref 22–32)
CREATININE: 0.9 mg/dL (ref 0.44–1.00)
Calcium: 9.3 mg/dL (ref 8.9–10.3)
GFR calc Af Amer: >60 mL/min (ref >60)
GFR calc non Af Amer: >60 mL/min (ref >60)
Glucose, Bld: 93 mg/dL (ref 65–99)
POTASSIUM: 4.4 mmol/L (ref 3.5–5.1)
SODIUM: 138 mmol/L (ref 135–145)

## 2016-06-11 LAB — CBC
HEMATOCRIT: 42.2 % (ref 36.0–46.0)
HEMOGLOBIN: 14.7 g/dL (ref 12.0–15.0)
MCH: 30.8 pg (ref 26.0–34.0)
MCHC: 34.8 g/dL (ref 30.0–36.0)
MCV: 88.5 fL (ref 78.0–100.0)
Platelets: 238 10*3/uL (ref 150–400)
RBC: 4.77 MIL/uL (ref 3.87–5.11)
RDW: 13.8 % (ref 11.5–15.5)
WBC: 9.2 10*3/uL (ref 4.0–10.5)

## 2016-06-11 NOTE — Patient Instructions (Addendum)
Your procedure is scheduled on:  Thursday, Jan. 25, 2018  Enter through the Main Entrance of [facility] at:  6:00 AM  Pick up the phone at the desk and dial [phone].  Call this number if you have problems the morning of surgery: [phone].  Remember: Do NOT eat food or drink after:  Midnight Wednesday, Jan. 24, 2018  Take these medicines the morning of surgery with a SIP OF WATER:  Synthroid, Valsartan, Use Asthma inhaler per normal routine  Bring Albuterol inhaler day of surgery.  Bring CPAP machine day of surgery.  Stop ALL herbal medications at this time.  Do NOT smoke the day of surgery.  Do NOT wear jewelry (body piercing), metal hair clips/bobby pins, make-up, or nail polish. Do NOT wear lotions, powders, or perfumes.  You may wear deodorant. Do NOT shave for 48 hours prior to surgery. Do NOT bring valuables to the hospital. Contacts, dentures, or bridgework may not be worn into surgery.  Have a responsible adult drive you home and stay with you for 24 hours after your procedure  Bring a copy of your healthcare power of attorney and living will documents.  **Effective Friday, Jan. 12, 2018, [place] will implement no hospital visitations from children age 36 and younger due to a steady increase in flu activity in our community and hospitals. **

## 2016-06-17 ENCOUNTER — Encounter: Payer: 59 | Admitting: Surgery

## 2016-06-17 DIAGNOSIS — N840 Polyp of corpus uteri: Secondary | ICD-10-CM | POA: Diagnosis not present

## 2016-06-19 NOTE — Anesthesia Preprocedure Evaluation (Addendum)
Anesthesia Evaluation  Patient identified by MRN, date of birth, ID band Patient awake    Reviewed: Allergy & Precautions, NPO status , Patient's Chart, lab work & pertinent test results  History of Anesthesia Complications Negative for: history of anesthetic complications  Airway Mallampati: II  TM Distance: >3 FB Neck ROM: Full    Dental no notable dental hx. (+) Dental Advisory Given   Pulmonary neg pulmonary ROS, asthma , sleep apnea , former smoker,    Pulmonary exam normal        Cardiovascular hypertension, Pt. on medications Normal cardiovascular exam  Study Conclusions  - Left ventricle: Systolic function was normal. The estimated   ejection fraction was in the range of 50% to 55%. Regional wall   motion abnormalities cannot be excluded. Left ventricular   diastolic function parameters were normal. - Mitral valve: There was mild regurgitation. - Right atrium: The atrium was mildly dilated.    Neuro/Psych negative neurological ROS  negative psych ROS   GI/Hepatic negative GI ROS, Neg liver ROS,   Endo/Other  Hypothyroidism   Renal/GU negative Renal ROS     Musculoskeletal negative musculoskeletal ROS (+)   Abdominal   Peds  Hematology Lymphoma, s/p BMT   Anesthesia Other Findings Day of surgery medications reviewed with the patient.  Reproductive/Obstetrics                            Anesthesia Physical Anesthesia Plan  ASA: III  Anesthesia Plan: General   Post-op Pain Management:    Induction: Intravenous  Airway Management Planned: LMA  Additional Equipment:   Intra-op Plan:   Post-operative Plan: Extubation in OR  Informed Consent: I have reviewed the patients History and Physical, chart, labs and discussed the procedure including the risks, benefits and alternatives for the proposed anesthesia with the patient or authorized representative who has indicated  his/her understanding and acceptance.   Dental advisory given  Plan Discussed with: CRNA and Anesthesiologist  Anesthesia Plan Comments:        Anesthesia Quick Evaluation

## 2016-06-20 ENCOUNTER — Ambulatory Visit (HOSPITAL_COMMUNITY): Payer: 59 | Admitting: Anesthesiology

## 2016-06-20 ENCOUNTER — Encounter (HOSPITAL_COMMUNITY)
Admission: RE | Disposition: A | Discharge status: Home / Self Care | Payer: Self-pay | Source: Ambulatory Visit | Attending: Obstetrics and Gynecology

## 2016-06-20 ENCOUNTER — Ambulatory Visit (HOSPITAL_COMMUNITY)
Admission: RE | Admit: 2016-06-20 | Discharge: 2016-06-20 | Disposition: A | Discharge status: Home / Self Care | Payer: 59 | Source: Ambulatory Visit | Attending: Obstetrics and Gynecology | Admitting: Obstetrics and Gynecology

## 2016-06-20 ENCOUNTER — Encounter (HOSPITAL_COMMUNITY): Payer: Self-pay

## 2016-06-20 DIAGNOSIS — E039 Hypothyroidism, unspecified: Secondary | ICD-10-CM | POA: Insufficient documentation

## 2016-06-20 DIAGNOSIS — N950 Postmenopausal bleeding: Secondary | ICD-10-CM | POA: Insufficient documentation

## 2016-06-20 DIAGNOSIS — G4730 Sleep apnea, unspecified: Secondary | ICD-10-CM | POA: Insufficient documentation

## 2016-06-20 DIAGNOSIS — Z79899 Other long term (current) drug therapy: Secondary | ICD-10-CM | POA: Diagnosis not present

## 2016-06-20 DIAGNOSIS — Z87891 Personal history of nicotine dependence: Secondary | ICD-10-CM | POA: Insufficient documentation

## 2016-06-20 DIAGNOSIS — G4733 Obstructive sleep apnea (adult) (pediatric): Secondary | ICD-10-CM | POA: Diagnosis not present

## 2016-06-20 DIAGNOSIS — N8500 Endometrial hyperplasia, unspecified: Secondary | ICD-10-CM | POA: Diagnosis not present

## 2016-06-20 DIAGNOSIS — I10 Essential (primary) hypertension: Secondary | ICD-10-CM | POA: Insufficient documentation

## 2016-06-20 DIAGNOSIS — N840 Polyp of corpus uteri: Secondary | ICD-10-CM | POA: Diagnosis not present

## 2016-06-20 HISTORY — PX: DILATATION & CURETTAGE/HYSTEROSCOPY WITH MYOSURE: SHX6511

## 2016-06-20 LAB — ABO/RH: ABO/RH(D): O NEG

## 2016-06-20 LAB — TYPE AND SCREEN
ABO/RH(D): O NEG
ANTIBODY SCREEN: NEGATIVE

## 2016-06-20 SURGERY — DILATATION & CURETTAGE/HYSTEROSCOPY WITH MYOSURE
Anesthesia: General | Site: Uterus

## 2016-06-20 MED ORDER — ONDANSETRON HCL 4 MG/2ML IJ SOLN
INTRAMUSCULAR | Status: DC | PRN
  Administered 2016-06-20: 4 mg via INTRAVENOUS

## 2016-06-20 MED ORDER — LIDOCAINE HCL 1 % IJ SOLN
INTRAMUSCULAR | Status: AC
  Filled 2016-06-20: qty 20

## 2016-06-20 MED ORDER — SCOPOLAMINE 1 MG/3DAYS TD PT72: 1.0000 | MEDICATED_PATCH | TRANSDERMAL | Status: DC

## 2016-06-20 MED ORDER — LACTATED RINGERS IV SOLN
INTRAVENOUS | Status: DC
  Administered 2016-06-20: 125 mL/h via INTRAVENOUS

## 2016-06-20 MED ORDER — PROPOFOL 10 MG/ML IV BOLUS
INTRAVENOUS | Status: AC
  Filled 2016-06-20: qty 20

## 2016-06-20 MED ORDER — LIDOCAINE HCL (CARDIAC) 20 MG/ML IV SOLN
INTRAVENOUS | Status: DC | PRN
  Administered 2016-06-20: 40 mg via INTRAVENOUS

## 2016-06-20 MED ORDER — LIDOCAINE HCL 1 % IJ SOLN
INTRAMUSCULAR | Status: DC | PRN
  Administered 2016-06-20: 20 mL

## 2016-06-20 MED ORDER — ONDANSETRON HCL 4 MG/2ML IJ SOLN
INTRAMUSCULAR | Status: AC
  Filled 2016-06-20: qty 2

## 2016-06-20 MED ORDER — PROPOFOL 10 MG/ML IV BOLUS
INTRAVENOUS | Status: DC | PRN
  Administered 2016-06-20: 200 mg via INTRAVENOUS

## 2016-06-20 MED ORDER — CEFAZOLIN SODIUM-DEXTROSE 2-4 GM/100ML-% IV SOLN
2.0000 g | INTRAVENOUS | Status: AC
  Administered 2016-06-20: 2 g via INTRAVENOUS

## 2016-06-20 MED ORDER — SODIUM CHLORIDE 0.9 % IR SOLN
Status: DC | PRN
  Administered 2016-06-20: 3000 mL

## 2016-06-20 MED ORDER — SCOPOLAMINE 1 MG/3DAYS TD PT72
1.0000 | MEDICATED_PATCH | Freq: Once | TRANSDERMAL | Status: DC
  Administered 2016-06-20: 1.5 mg via TRANSDERMAL

## 2016-06-20 MED ORDER — PHENYLEPHRINE 40 MCG/ML (10ML) SYRINGE FOR IV PUSH (FOR BLOOD PRESSURE SUPPORT)
PREFILLED_SYRINGE | INTRAVENOUS | Status: AC
  Filled 2016-06-20: qty 10

## 2016-06-20 MED ORDER — PHENYLEPHRINE HCL 10 MG/ML IJ SOLN
INTRAMUSCULAR | Status: DC | PRN
Start: 2016-06-20 — End: 2016-06-20
  Administered 2016-06-20: 80 ug via INTRAVENOUS

## 2016-06-20 MED ORDER — PROMETHAZINE HCL 25 MG/ML IJ SOLN: 6.2500 mg | INTRAMUSCULAR | Status: DC | PRN

## 2016-06-20 MED ORDER — DEXAMETHASONE SODIUM PHOSPHATE 4 MG/ML IJ SOLN
INTRAMUSCULAR | Status: AC
  Filled 2016-06-20: qty 1

## 2016-06-20 MED ORDER — MIDAZOLAM HCL 2 MG/2ML IJ SOLN
INTRAMUSCULAR | Status: DC | PRN
  Administered 2016-06-20: 2 mg via INTRAVENOUS

## 2016-06-20 MED ORDER — OXYCODONE-ACETAMINOPHEN 7.5-325 MG PO TABS: 1.0000 | ORAL_TABLET | ORAL | 0 refills | Status: DC | PRN

## 2016-06-20 MED ORDER — SCOPOLAMINE 1 MG/3DAYS TD PT72
MEDICATED_PATCH | TRANSDERMAL | Status: AC
  Filled 2016-06-20: qty 1

## 2016-06-20 MED ORDER — EPHEDRINE 5 MG/ML INJ
INTRAVENOUS | Status: AC
  Filled 2016-06-20: qty 10

## 2016-06-20 MED ORDER — EPHEDRINE SULFATE 50 MG/ML IJ SOLN
INTRAMUSCULAR | Status: DC | PRN
  Administered 2016-06-20: 10 mg via INTRAVENOUS
  Administered 2016-06-20: 5 mg via INTRAVENOUS

## 2016-06-20 MED ORDER — FENTANYL CITRATE (PF) 100 MCG/2ML IJ SOLN
INTRAMUSCULAR | Status: AC
  Filled 2016-06-20: qty 2

## 2016-06-20 MED ORDER — MIDAZOLAM HCL 2 MG/2ML IJ SOLN
INTRAMUSCULAR | Status: AC
  Filled 2016-06-20: qty 2

## 2016-06-20 MED ORDER — HYDROMORPHONE HCL 1 MG/ML IJ SOLN
INTRAMUSCULAR | Status: AC
  Administered 2016-06-20: 0.25 mg via INTRAVENOUS
  Filled 2016-06-20: qty 1

## 2016-06-20 MED ORDER — KETOROLAC TROMETHAMINE 30 MG/ML IJ SOLN
INTRAMUSCULAR | Status: AC
  Filled 2016-06-20: qty 1

## 2016-06-20 MED ORDER — KETOROLAC TROMETHAMINE 30 MG/ML IJ SOLN
INTRAMUSCULAR | Status: DC | PRN
  Administered 2016-06-20: 30 mg via INTRAVENOUS

## 2016-06-20 MED ORDER — HYDROMORPHONE HCL 1 MG/ML IJ SOLN
0.2500 mg | INTRAMUSCULAR | Status: DC | PRN
  Administered 2016-06-20: 0.25 mg via INTRAVENOUS

## 2016-06-20 MED ORDER — FENTANYL CITRATE (PF) 100 MCG/2ML IJ SOLN
INTRAMUSCULAR | Status: DC | PRN
  Administered 2016-06-20: 50 ug via INTRAVENOUS

## 2016-06-20 MED ORDER — DEXAMETHASONE SODIUM PHOSPHATE 4 MG/ML IJ SOLN
INTRAMUSCULAR | Status: DC | PRN
Start: 2016-06-20 — End: 2016-06-20
  Administered 2016-06-20: 4 mg via INTRAVENOUS

## 2016-06-20 MED ORDER — LIDOCAINE HCL (CARDIAC) 20 MG/ML IV SOLN
INTRAVENOUS | Status: AC
  Filled 2016-06-20: qty 5

## 2016-06-20 SURGICAL SUPPLY — 17 items
CATH ROBINSON RED A/P 16FR (CATHETERS) ×2 IMPLANT
CLOTH BEACON ORANGE TIMEOUT ST (SAFETY) ×2 IMPLANT
CONTAINER PREFILL 10% NBF 60ML (FORM) ×4 IMPLANT
DEVICE MYOSURE LITE (MISCELLANEOUS) ×2 IMPLANT
DEVICE MYOSURE REACH (MISCELLANEOUS) IMPLANT
FILTER ARTHROSCOPY CONVERTOR (FILTER) ×2 IMPLANT
GLOVE BIO SURGEON STRL SZ7 (GLOVE) ×4 IMPLANT
GLOVE BIOGEL PI IND STRL 7.0 (GLOVE) ×1 IMPLANT
GLOVE BIOGEL PI INDICATOR 7.0 (GLOVE) ×1
GOWN STRL REUS W/TWL LRG LVL3 (GOWN DISPOSABLE) ×4 IMPLANT
PACK VAGINAL MINOR WOMEN LF (CUSTOM PROCEDURE TRAY) ×2 IMPLANT
PAD OB MATERNITY 4.3X12.25 (PERSONAL CARE ITEMS) ×2 IMPLANT
SEAL ROD LENS SCOPE MYOSURE (ABLATOR) ×2 IMPLANT
TOWEL OR 17X24 6PK STRL BLUE (TOWEL DISPOSABLE) ×4 IMPLANT
TUBING AQUILEX INFLOW (TUBING) ×2 IMPLANT
TUBING AQUILEX OUTFLOW (TUBING) ×2 IMPLANT
WATER STERILE IRR 1000ML POUR (IV SOLUTION) ×2 IMPLANT

## 2016-06-20 NOTE — Anesthesia Postprocedure Evaluation (Signed)
Anesthesia Post Note  Patient: Ashley Mckee  Procedure(s) Performed: Procedure(s) (LRB): DILATATION & CURETTAGE/HYSTEROSCOPY WITH MYOSURE (N/A)  Patient location during evaluation: PACU Anesthesia Type: General Level of consciousness: sedated Pain management: pain level controlled Vital Signs Assessment: post-procedure vital signs reviewed and stable Respiratory status: spontaneous breathing and respiratory function stable Cardiovascular status: stable Anesthetic complications: no        Last Vitals:  Vitals:   06/20/16 0830 06/20/16 0845  BP: (!) 96/55   Pulse: 81 77  Resp: 15 13  Temp:      Last Pain:  Vitals:   06/20/16 0845  TempSrc:   PainSc: 1    Pain Goal: Patients Stated Pain Goal: 4 (06/20/16 0622)               Duane Boston DANIEL

## 2016-06-20 NOTE — Discharge Instructions (Signed)
DISCHARGE INSTRUCTIONS: HYSTEROSCOPY  The following instructions have been prepared to help you care for yourself upon your return home.  May Remove Scop patch on or before 06/23/16  May take Ibuprofen after 1:30pm today.  May take stool softner while taking narcotic pain medication to prevent constipation.  Drink plenty of water.  Personal hygiene:  Use sanitary pads for vaginal drainage, not tampons.  Shower the day after your procedure.  NO tub baths, pools or Jacuzzis for 2-3 weeks.  Wipe front to back after using the bathroom.  Activity and limitations:  Do NOT drive or operate any equipment for 24 hours. The effects of anesthesia are still present and drowsiness may result.  Do NOT rest in bed all day.  Walking is encouraged.  Walk up and down stairs slowly.  You may resume your normal activity in one to two days or as indicated by your physician. Sexual activity: NO intercourse for at least 2 weeks after the procedure, or as indicated by your Doctor.  Diet: Eat a light meal as desired this evening. You may resume your usual diet tomorrow.  Return to Work: You may resume your work activities in one to two days or as indicated by Marine scientist.  What to expect after your surgery: Expect to have vaginal bleeding/discharge for 2-3 days and spotting for up to 10 days. It is not unusual to have soreness for up to 1-2 weeks. You may have a slight burning sensation when you urinate for the first day. Mild cramps may continue for a couple of days. You may have a regular period in 2-6 weeks.  Call your doctor for any of the following:  Excessive vaginal bleeding or clotting, saturating and changing one pad every hour.  Inability to urinate 6 hours after discharge from hospital.  Pain not relieved by pain medication.  Fever of 100.4 F or greater.  Unusual vaginal discharge or odor.  Return to office _________________Call for an appointment  ___________________ Patients signature: ______________________ Nurses signature ________________________  Post Anesthesia Care Unit [phone]    Post Anesthesia Home Care Instructions  Activity: Get plenty of rest for the remainder of the day. A responsible adult should stay with you for 24 hours following the procedure.  For the next 24 hours, DO NOT: -Drive a car -Paediatric nurse -Drink alcoholic beverages -Take any medication unless instructed by your physician -Make any legal decisions or sign important papers.  Meals: Start with liquid foods such as gelatin or soup. Progress to regular foods as tolerated. Avoid greasy, spicy, heavy foods. If nausea and/or vomiting occur, drink only clear liquids until the nausea and/or vomiting subsides. Call your physician if vomiting continues.  Special Instructions/Symptoms: Your throat may feel dry or sore from the anesthesia or the breathing tube placed in your throat during surgery. If this causes discomfort, gargle with warm salt water. The discomfort should disappear within 24 hours.  If you had a scopolamine patch placed behind your ear for the management of post- operative nausea and/or vomiting:  1. The medication in the patch is effective for 72 hours, after which it should be removed.  Wrap patch in a tissue and discard in the trash. Wash hands thoroughly with soap and water. 2. You may remove the patch earlier than 72 hours if you experience unpleasant side effects which may include dry mouth, dizziness or visual disturbances. 3. Avoid touching the patch. Wash your hands with soap and water after contact with the patch.

## 2016-06-20 NOTE — Transfer of Care (Signed)
Immediate Anesthesia Transfer of Care Note  Patient: Ashley Mckee  Procedure(s) Performed: Procedure(s): DILATATION & CURETTAGE/HYSTEROSCOPY WITH MYOSURE (N/A)  Patient Location: PACU  Anesthesia Type:General  Level of Consciousness: awake, alert , oriented and patient cooperative  Airway & Oxygen Therapy: Patient Spontanous Breathing and Patient connected to nasal cannula oxygen  Post-op Assessment: Report given to RN and Post -op Vital signs reviewed and stable  Post vital signs: Reviewed and stable  Last Vitals:  Vitals:   06/20/16 0622  BP: 106/76  Pulse: 83  Resp: 16  Temp: 36.7 C    Last Pain:  Vitals:   06/20/16 0622  TempSrc: Oral      Patients Stated Pain Goal: 4 ([phone])  Complications: No apparent anesthesia complications

## 2016-06-20 NOTE — Anesthesia Procedure Notes (Signed)
Procedure Name: LMA Insertion Date/Time: 06/20/2016 7:20 AM Performed by: Raenette Rover Pre-anesthesia Checklist: Patient identified, Emergency Drugs available, Suction available and Patient being monitored Patient Re-evaluated:Patient Re-evaluated prior to inductionOxygen Delivery Method: Circle system utilized Preoxygenation: Pre-oxygenation with 100% oxygen Intubation Type: IV induction LMA: LMA inserted LMA Size: 4.0 Number of attempts: 1 Placement Confirmation: positive ETCO2,  CO2 detector and breath sounds checked- equal and bilateral Tube secured with: Tape Dental Injury: Teeth and Oropharynx as per pre-operative assessment

## 2016-06-20 NOTE — H&P (Signed)
  History and physical exam unchanged 

## 2016-06-20 NOTE — Brief Op Note (Signed)
06/20/2016  7:43 AM  PATIENT:  Ashley Mckee  51 y.o. female  PRE-OPERATIVE DIAGNOSIS:  polyps  POST-OPERATIVE DIAGNOSIS:  POLYPS  PROCEDURE:  Procedure(s): DILATATION & CURETTAGE/HYSTEROSCOPY WITH MYOSURE (N/A)  SURGEON:  Surgeon(s) and Role:    * Arvella Nigh, MD - Primary  PHYSICIAN ASSISTANT:   ASSISTANTS: none   ANESTHESIA:   general and paracervical block  EBL:  Total I/O In: 500 [I.V.:500] Out: 102 [Urine:100; Blood:2]  BLOOD ADMINISTERED:none  DRAINS: none   LOCAL MEDICATIONS USED:  XYLOCAINE   SPECIMEN:  Source of Specimen:  endometrail plolyp and curretting  DISPOSITION OF SPECIMEN:  PATHOLOGY  COUNTS:  YES  TOURNIQUET:  * No tourniquets in log *  DICTATION: .Other Dictation: Dictation Number [id number]  PLAN OF CARE: Discharge to home after PACU  PATIENT DISPOSITION:  PACU - hemodynamically stable.   Delay start of Pharmacological VTE agent (>24hrs) due to surgical blood loss or risk of bleeding: not applicable

## 2016-06-21 ENCOUNTER — Encounter (HOSPITAL_COMMUNITY): Payer: Self-pay | Admitting: Obstetrics and Gynecology

## 2016-06-21 NOTE — Op Note (Signed)
NAME:  Ashley Mckee, SAFRAN NO.:  [id number]  MEDICAL RECORD NO.:  [id number]  LOCATION:                                 FACILITY:  PHYSICIAN:  Darlyn Chamber, M.D.   DATE OF BIRTH:  November 13, 1965  DATE OF PROCEDURE:  06/20/2016 DATE OF DISCHARGE:                              OPERATIVE REPORT   PREOPERATIVE DIAGNOSIS:  Postmenopausal bleeding with endometrial thickening and polyp.  POSTOPERATIVE DIAGNOSIS:  Postmenopausal bleeding with endometrial thickening and polyp.  OPERATIVE PROCEDURE:  Paracervical block.  Cervical dilation. Hysteroscopy with resection of endometrial polyp.  Endometrial curettings.  SURGEON:  Darlyn Chamber, M.D.  ANESTHESIA:  General with paracervical block.  BLOOD LOSS:  Minimal.  PACKS:  None.  DRAINS:  None.  INTRAOPERATIVE BLOOD PLACEMENT:  None.  COMPLICATIONS:  None.  INDICATION:  Previously dictated.  DESCRIPTION OF PROCEDURE:  The patient was taken the OR.  She was placed in dorsal lithotomy position using the Allen stirrups.  After satisfactory level of general anesthesia was obtained, the perineum and vagina were prepped out Betadine.  The patient draped as sterile field. Speculum was placed in vaginal vault.  Cervix was grasped with a single- tooth tenaculum.  Paracervical block was instituted using 1% Xylocaine. Uterus sounded to approximately 9 cm.  Cervix was dilated to a size 23 Pratt dilator.  Hysteroscope was then introduced.  Intrauterine cavity was distended using saline.  She had an anterior polyp.  Using the MyoSure, this was completely resected.  The rest of the endometrium was smooth and atrophic.  There were no signs of perforation or excessive fluid loss.  At this point in time, the hysteroscope was removed. Endometrial curettings were obtained, sent for pathological review.  At this point in time, single-tooth tenaculum and speculum was then removed.  The patient was taken out of the dorsal  lithotomy position.  Once alert, extubated and transferred to recovery in good condition.  Sponge, instrument, and needle count were reported as correct by circulating nurse.     Darlyn Chamber, M.D.     JSM/MEDQ  D:  06/20/2016  T:  06/21/2016  Job:  [id number]

## 2016-06-24 ENCOUNTER — Encounter: Payer: Self-pay | Admitting: Surgery

## 2016-06-28 DIAGNOSIS — G4733 Obstructive sleep apnea (adult) (pediatric): Secondary | ICD-10-CM | POA: Diagnosis not present

## 2016-07-01 ENCOUNTER — Encounter: Payer: Self-pay | Admitting: Surgery

## 2016-07-01 ENCOUNTER — Ambulatory Visit (INDEPENDENT_AMBULATORY_CARE_PROVIDER_SITE_OTHER): Payer: 59 | Admitting: Surgery

## 2016-07-01 VITALS — BP 98/70 | HR 82 | Temp 97.3°F | Resp 16 | Ht 65.0 in | Wt 232.0 lb

## 2016-07-01 DIAGNOSIS — I6523 Occlusion and stenosis of bilateral carotid arteries: Secondary | ICD-10-CM

## 2016-07-01 NOTE — Progress Notes (Signed)
[facility]  Patient name: Ashley Mckee MRN: [id number] DOB: [birth year] Sex: female   REFERRING PROVIDER:   Dr. Radene Knee Dr. Carollee Herter   REASON FOR CONSULT:    Carotid Bruit  HISTORY OF PRESENT ILLNESS:   Ashley Mckee is a 51 y.o. female, who is Referred today for evaluation of carotid artery disease.  The patient recently had a physical exam which identified a carotid bruit.  She was sent for carotid duplex and then referred here.  She denies any symptoms of a stroke or TIA.  Specifically, she denies numbness and weakness in either extremity.  She denies slurred speech.  She denies amaurosis fugax.  The patient has a history of non-Hodgkin's lymphoma.  She was treated with chest wall radiation.  She does take losartan for her heart.  Her last echo showed an ejection fraction greater than 50%.  She states this is not for hypertension.  She is a former smoker.  PAST MEDICAL HISTORY    Past Medical History:  Diagnosis Date  . Asthma   . 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 CHF (congestive heart failure) (H[facility])    Last EF was >50%   . Diaphragm dysfunction    Left Side - She thinks this resulted after her chemotherapy.    . Diaphragm paralysis    left  . Dyspnea   . Heart murmur   . 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 Hypothyroidism   . Non Hodgkin's lymphoma ([place]) 2000   Non Hodgkin's Lymphoma (Bone Marrow Transplant)  - [place]  . OSA (obstructive sleep apnea) 03/13/2016  . Thyroid disease      FAMILY HISTORY   Family History  Problem Relation Age of Onset  . Hypertension Mother   . Diabetes Father   . Hypertension Father   . Colon cancer Maternal Grandmother   . Stomach cancer Other     maternal great grandmother  . Esophageal cancer Neg Hx   . Rectal cancer Neg Hx   . Liver cancer Neg Hx     SOCIAL HISTORY:   Social History   Social  History  . Marital status: Married    Spouse name: DAVID   . Number of children: 3  . Years of education: 12   Occupational History  . Referral [employer]   Social History Main Topics  . Smoking status: Former Smoker    Packs/day: 1.50    Years: 17.00    Types: Cigarettes    Quit date: 05/28/1999  . Smokeless tobacco: Never Used  . Alcohol use Yes     Comment: Rarely   . Drug use: No  . Sexual activity: Yes    Partners: Male    Birth control/ protection: Post-menopausal   Other Topics Concern  . Not on file   Social History Narrative   Marital Status:  Married [name])   Children:  Daughter (3)    Pets: Dog (2) Cat (1)    Living Situation: Lives with husband and kids    Occupation: Risk manager)     Education: Environmental education officer    Tobacco Use/Exposure:  Former Smoker    Alcohol Use:  Rarely    Drug Use:  None   Diet:  Regular   Exercise:  None   Hobbies:  Walking              ALLERGIES:    Allergies  Allergen Reactions  .  Ace Inhibitors Cough    CURRENT MEDICATIONS:    Current Outpatient Prescriptions  Medication Sig Dispense Refill  . albuterol (PROVENTIL HFA;VENTOLIN HFA) 108 (90 BASE) MCG/ACT inhaler Inhale 1 puff into the lungs every 6 (six) hours as needed for wheezing or shortness of breath. (Patient taking differently: Inhale 2 puffs into the lungs every 6 (six) hours as needed for wheezing or shortness of breath. ) 1 Inhaler 1  . ibuprofen (ADVIL,MOTRIN) 200 MG tablet Take 400 mg by mouth daily as needed for headache or moderate pain.    . mometasone-formoterol (DULERA) 100-5 MCG/ACT AERO Take 2 puffs first thing in am and then another 2 puffs about 12 hours later. 1 Inhaler 11  . mometasone-formoterol (DULERA) 200-5 MCG/ACT AERO Inhale 2 puffs into the lungs 2 (two) times daily. (Patient taking differently: Inhale 2 puffs into the lungs as needed (with increased breathin issues). ) 1 Inhaler 5  . NONFORMULARY OR COMPOUNDED  ITEM Nitroglycerin Ointment 0.125% Apply pea sized every 8 hours prn (Patient taking differently: Nitroglycerin Ointment 0.125% Apply pea sized every 8 hours as needed fissure) 30 each 1  . oxyCODONE-acetaminophen (PERCOCET) 7.5-325 MG tablet Take 1 tablet by mouth every 4 (four) hours as needed for severe pain. 15 tablet 0  . psyllium (METAMUCIL) 58.6 % powder Take 1 packet by mouth daily.    Marland Kitchen SYNTHROID 150 MCG tablet TAKE 1 TABLET BY MOUTH ONCE DAILY 90 tablet 0  . valsartan (DIOVAN) 160 MG tablet Take 1 tablet (160 mg total) by mouth daily. 30 tablet 11   No current facility-administered medications for this visit.     REVIEW OF SYSTEMS:   [X]  denotes positive finding, [ ]  denotes negative finding Cardiac  Comments:  Chest pain or chest pressure:    Shortness of breath upon exertion:    Short of breath when lying flat:    Irregular heart rhythm:        Vascular    Pain in calf, thigh, or hip brought on by ambulation:    Pain in feet at night that wakes you up from your sleep:     Blood clot in your veins:    Leg swelling:         Pulmonary    Oxygen at home:    Productive cough:     Wheezing:         Neurologic    Sudden weakness in arms or legs:     Sudden numbness in arms or legs:     Sudden onset of difficulty speaking or slurred speech:    Temporary loss of vision in one eye:     Problems with dizziness:         Gastrointestinal    Blood in stool:      Vomited blood:         Genitourinary    Burning when urinating:     Blood in urine:        Psychiatric    Major depression:         Hematologic    Bleeding problems:    Problems with blood clotting too easily:        Skin    Rashes or ulcers:        Constitutional    Fever or chills:     PHYSICAL EXAM:   Vitals:   07/01/16 0920 07/01/16 0924  BP: 102/66 98/70  Pulse: 82   Resp: 16   Temp: 97.3 F (36.3 C)  TempSrc: Oral   SpO2: 93%   Weight: 232 lb (105.2 kg)   Height: 5\' 5"  (1.651 m)      GENERAL: The patient is a well-nourished female, in no acute distress. The vital signs are documented above. CARDIAC: There is a regular rate and rhythm With systolic ejection murmur VASCULAR: Bilateral carotid bruit which appeared to be radiating from her cardiac murmur.  She has palpable pedal pulses. PULMONARY: Nonlabored respirations MUSCULOSKELETAL: There are no major deformities or cyanosis. NEUROLOGIC: No focal weakness or paresthesias are detected. SKIN: There are no ulcers or rashes noted. PSYCHIATRIC: The patient has a normal affect.  STUDIES:   I have reviewed her carotid duplex which shows 1-39 percent stenosis bilaterally.  The vertebral arteries were antegrade, however there was abnormal waveform in the left vertebral artery  ASSESSMENT and PLAN   Carotid occlusive disease: I feel that the patient's murmur secondary to her cardiac murmur.  She does not have any significant disease on ultrasound and has not had a neurologic symptoms.  I am going to bring her back for a repeat evaluation in 2 years mainly because of the abnormal waveform detected on her vertebral evaluation.   Annamarie Major, MD [facility] [phone] Pager [phone]

## 2016-07-02 DIAGNOSIS — Z09 Encounter for follow-up examination after completed treatment for conditions other than malignant neoplasm: Secondary | ICD-10-CM | POA: Diagnosis not present

## 2016-07-09 ENCOUNTER — Other Ambulatory Visit: Payer: Self-pay | Admitting: Family Medicine

## 2016-07-09 ENCOUNTER — Telehealth: Payer: Self-pay

## 2016-07-09 DIAGNOSIS — J101 Influenza due to other identified influenza virus with other respiratory manifestations: Secondary | ICD-10-CM

## 2016-07-09 MED ORDER — OSELTAMIVIR PHOSPHATE 75 MG PO CAPS: 75.0000 mg | ORAL_CAPSULE | Freq: Every day | ORAL | 0 refills | Status: DC

## 2016-07-09 MED FILL — OSELTAMIVIR PHOS 75 MG CAP: 75 | 10 days supply | Qty: 10 | Fill #0

## 2016-07-09 NOTE — Telephone Encounter (Signed)
Rx sent to pharmacy. LB 

## 2016-07-12 ENCOUNTER — Encounter: Payer: Self-pay | Admitting: Gastroenterology

## 2016-07-19 ENCOUNTER — Encounter: Payer: Self-pay | Admitting: Gastroenterology

## 2016-07-23 DIAGNOSIS — G4733 Obstructive sleep apnea (adult) (pediatric): Secondary | ICD-10-CM | POA: Diagnosis not present

## 2016-07-23 MED FILL — SUPREP BOWEL PREP KIT: 17.5-3.13-1 | 1 days supply | Qty: 354 | Fill #0

## 2016-07-26 ENCOUNTER — Encounter: Payer: Self-pay | Admitting: Gastroenterology

## 2016-07-26 ENCOUNTER — Ambulatory Visit (AMBULATORY_SURGERY_CENTER): Payer: 59 | Admitting: Gastroenterology

## 2016-07-26 VITALS — BP 90/50 | HR 73 | Temp 98.4°F | Resp 22 | Ht 65.0 in | Wt 240.0 lb

## 2016-07-26 DIAGNOSIS — D126 Benign neoplasm of colon, unspecified: Secondary | ICD-10-CM | POA: Diagnosis not present

## 2016-07-26 DIAGNOSIS — K602 Anal fissure, unspecified: Secondary | ICD-10-CM

## 2016-07-26 DIAGNOSIS — E669 Obesity, unspecified: Secondary | ICD-10-CM | POA: Diagnosis not present

## 2016-07-26 DIAGNOSIS — K921 Melena: Secondary | ICD-10-CM | POA: Diagnosis not present

## 2016-07-26 DIAGNOSIS — I429 Cardiomyopathy, unspecified: Secondary | ICD-10-CM | POA: Diagnosis not present

## 2016-07-26 DIAGNOSIS — D124 Benign neoplasm of descending colon: Secondary | ICD-10-CM

## 2016-07-26 DIAGNOSIS — K635 Polyp of colon: Secondary | ICD-10-CM | POA: Diagnosis not present

## 2016-07-26 DIAGNOSIS — E039 Hypothyroidism, unspecified: Secondary | ICD-10-CM | POA: Diagnosis not present

## 2016-07-26 DIAGNOSIS — G4733 Obstructive sleep apnea (adult) (pediatric): Secondary | ICD-10-CM | POA: Diagnosis not present

## 2016-07-26 DIAGNOSIS — D120 Benign neoplasm of cecum: Secondary | ICD-10-CM

## 2016-07-26 DIAGNOSIS — D127 Benign neoplasm of rectosigmoid junction: Secondary | ICD-10-CM

## 2016-07-26 DIAGNOSIS — I10 Essential (primary) hypertension: Secondary | ICD-10-CM | POA: Diagnosis not present

## 2016-07-26 MED ORDER — SODIUM CHLORIDE 0.9 % IV SOLN: 500.0000 mL | INTRAVENOUS | Status: DC

## 2016-07-26 NOTE — Op Note (Signed)
[place] Patient Name: Ashley Mckee Procedure Date: 07/26/2016 2:36 PM MRN: [id number] Endoscopist: Remo Lipps P. Ranier Coach MD, MD Age: 51 Referring MD:  Date of Birth: 25-May-1966 Gender: Female Account #: [id number] Procedure:                Colonoscopy Indications:              This is the patient's first colonoscopy,                            Hematochezia, suspected to be due to anal fissure.                            Patient started rectal nitroglycerin ointment                            within the past week Medicines:                Monitored Anesthesia Care Procedure:                Pre-Anesthesia Assessment:                           - Prior to the procedure, a History and Physical                            was performed, and patient medications and                            allergies were reviewed. The patient's tolerance of                            previous anesthesia was also reviewed. The risks                            and benefits of the procedure and the sedation                            options and risks were discussed with the patient.                            All questions were answered, and informed consent                            was obtained. Prior Anticoagulants: The patient has                            taken no previous anticoagulant or antiplatelet                            agents. ASA Grade Assessment: III - A patient with                            severe systemic disease. After reviewing the risks  and benefits, the patient was deemed in                            satisfactory condition to undergo the procedure.                           After obtaining informed consent, the colonoscope                            was passed under direct vision. Throughout the                            procedure, the patient's blood pressure, pulse, and                            oxygen saturations were monitored  continuously. The                            Colonoscope was introduced through the anus and                            advanced to the the terminal ileum, with                            identification of the appendiceal orifice and IC                            valve. The colonoscopy was performed without                            difficulty. The patient tolerated the procedure                            well. The quality of the bowel preparation was                            good. The terminal ileum, ileocecal valve,                            appendiceal orifice, and rectum were photographed. Scope In: 2:39:11 PM Scope Out: 2:55:51 PM Scope Withdrawal Time: 0 hours 15 minutes 8 seconds  Total Procedure Duration: 0 hours 16 minutes 40 seconds  Findings:                 The perianal exam findings include anal fissure,                            improved compared to when previously visualized.                           A 4 mm polyp was found in the cecum. The polyp was                            sessile. The polyp was removed with a  cold snare.                            Resection and retrieval were complete.                           A 5 mm polyp was found in the descending colon. The                            polyp was sessile. The polyp was removed with a                            cold snare. Resection and retrieval were complete.                           A 4 mm polyp was found in the recto-sigmoid colon.                            The polyp was sessile. The polyp was removed with a                            cold snare. Resection and retrieval were complete.                           The terminal ileum appeared normal.                           Internal hemorrhoids were found during                            retroflexion. The hemorrhoids were small.                           The exam was otherwise without abnormality. Complications:            No immediate complications.  Estimated blood loss:                            Minimal. Estimated Blood Loss:     Estimated blood loss was minimal. Impression:               - Anal fissure found on perianal exam, healing.                           - One 4 mm polyp in the cecum, removed with a cold                            snare. Resected and retrieved.                           - One 5 mm polyp in the descending colon, removed                            with a cold snare. Resected and retrieved.                           -  One 4 mm polyp at the recto-sigmoid colon,                            removed with a cold snare. Resected and retrieved.                           - The examined portion of the ileum was normal.                           - Internal hemorrhoids.                           - The examination was otherwise normal. Recommendation:           - Patient has a contact number available for                            emergencies. The signs and symptoms of potential                            delayed complications were discussed with the                            patient. Return to normal activities tomorrow.                            Written discharge instructions were provided to the                            patient.                           - Resume previous diet.                           - Continue present medications (nitroglycerin                            ointment)                           - No ibuprofen, naproxen, or other non-steroidal                            anti-inflammatory drugs for 2 weeks after polyp                            removal.                           - Await pathology results.                           - Repeat colonoscopy is recommended for                            surveillance. The colonoscopy date will be  determined after pathology results from today's                            exam become available for review. Remo Lipps P. Briggitte Boline MD,  MD 07/26/2016 3:00:46 PM This report has been signed electronically.

## 2016-07-26 NOTE — Patient Instructions (Signed)
YOU HAD AN ENDOSCOPIC PROCEDURE TODAY AT [facility]:   Refer to the procedure report that was given to you for any specific questions about what was found during the examination.  If the procedure report does not answer your questions, please call your gastroenterologist to clarify.  If you requested that your care partner not be given the details of your procedure findings, then the procedure report has been included in a sealed envelope for you to review at your convenience later.  YOU SHOULD EXPECT: Some feelings of bloating in the abdomen. Passage of more gas than usual.  Walking can help get rid of the air that was put into your GI tract during the procedure and reduce the bloating. If you had a lower endoscopy (such as a colonoscopy or flexible sigmoidoscopy) you may notice spotting of blood in your stool or on the toilet paper. If you underwent a bowel prep for your procedure, you may not have a normal bowel movement for a few days.  Please Note:  You might notice some irritation and congestion in your nose or some drainage.  This is from the oxygen used during your procedure.  There is no need for concern and it should clear up in a day or so.  SYMPTOMS TO REPORT IMMEDIATELY:   Following lower endoscopy (colonoscopy or flexible sigmoidoscopy):  Excessive amounts of blood in the stool  Significant tenderness or worsening of abdominal pains  Swelling of the abdomen that is new, acute  Fever of 100F or higher   For urgent or emergent issues, a gastroenterologist can be reached at any hour by calling [phone].   DIET:  We do recommend a small meal at first, but then you may proceed to your regular diet.  Drink plenty of fluids but you should avoid alcoholic beverages for 24 hours.  ACTIVITY:  You should plan to take it easy for the rest of today and you should NOT DRIVE or use heavy machinery until tomorrow (because of the sedation medicines used during the test).     FOLLOW UP: Our staff will call the number listed on your records the next business day following your procedure to check on you and address any questions or concerns that you may have regarding the information given to you following your procedure. If we do not reach you, we will leave a message.  However, if you are feeling well and you are not experiencing any problems, there is no need to return our call.  We will assume that you have returned to your regular daily activities without incident.  If any biopsies were taken you will be contacted by phone or by letter within the next 1-3 weeks.  Please call us at [phone] if you have not heard about the biopsies in 3 weeks.    SIGNATURES/CONFIDENTIALITY: You and/or your care partner have signed paperwork which will be entered into your electronic medical record.  These signatures attest to the fact that that the information above on your After Visit Summary has been reviewed and is understood.  Full responsibility of the confidentiality of this discharge information lies with you and/or your care-partner.    Handouts were given to your care partner on polyps and hemorrhoids. No aspirin, aspirin products,  ibuprofen, naproxen, advil, motrin, aleve, or other non-steroidal anti-inflammatory drugs for 14 days after polyp removal. You may resume your other current medications today. Await biopsy results. Please call if any questions or concerns.

## 2016-07-26 NOTE — Progress Notes (Signed)
No problems noted in the recovery room. maw 

## 2016-07-26 NOTE — Progress Notes (Signed)
Called to room to assist during endoscopic procedure.  Patient ID and intended procedure confirmed with present staff. Received instructions for my participation in the procedure from the performing physician.  

## 2016-07-26 NOTE — Progress Notes (Signed)
To recovery VSS report to RN 

## 2016-07-29 ENCOUNTER — Telehealth: Payer: Self-pay

## 2016-07-29 NOTE — Telephone Encounter (Signed)
  Follow up Call-  Call back number 07/26/2016  Post procedure Call Back phone  # [phone]  Permission to leave phone message Yes  Some recent data might be hidden     Patient questions:  Do you have a fever, pain , or abdominal swelling? No. Pain Score  0 *  Have you tolerated food without any problems? Yes.    Have you been able to return to your normal activities? Yes.    Do you have any questions about your discharge instructions: Diet   No. Medications  No. Follow up visit  No.  Do you have questions or concerns about your Care? No.  Actions: * If pain score is 4 or above: No action needed, pain <4.

## 2016-07-30 DIAGNOSIS — G4733 Obstructive sleep apnea (adult) (pediatric): Secondary | ICD-10-CM | POA: Diagnosis not present

## 2016-07-31 ENCOUNTER — Telehealth: Payer: Self-pay | Admitting: Pulmonary Disease

## 2016-07-31 DIAGNOSIS — G4733 Obstructive sleep apnea (adult) (pediatric): Secondary | ICD-10-CM

## 2016-07-31 NOTE — Telephone Encounter (Signed)
Spoke with pt. She is aware of VS's response. Order has been placed for pressure change. Nothing further was needed.

## 2016-07-31 NOTE — Telephone Encounter (Signed)
Change to auto cpap 5 to 15  Cm H2O

## 2016-07-31 NOTE — Telephone Encounter (Signed)
Spoke with pt. States that in January, VS increased her pressure to 10-20. Now she feels like this pressure is to strong. States that she wakes up and feels like her mouth is full of air. She has recently lost 15lbs.  VS - please advise. Thanks.

## 2016-08-03 ENCOUNTER — Encounter: Payer: Self-pay | Admitting: Gastroenterology

## 2016-08-09 ENCOUNTER — Encounter: Payer: Self-pay | Admitting: Family Medicine

## 2016-08-09 ENCOUNTER — Ambulatory Visit (INDEPENDENT_AMBULATORY_CARE_PROVIDER_SITE_OTHER): Payer: 59 | Admitting: Family Medicine

## 2016-08-09 VITALS — BP 100/68 | HR 77 | Temp 98.0°F | Resp 98 | Ht 65.0 in | Wt 219.6 lb

## 2016-08-09 DIAGNOSIS — J4520 Mild intermittent asthma, uncomplicated: Secondary | ICD-10-CM | POA: Diagnosis not present

## 2016-08-09 DIAGNOSIS — E039 Hypothyroidism, unspecified: Secondary | ICD-10-CM | POA: Diagnosis not present

## 2016-08-09 DIAGNOSIS — I420 Dilated cardiomyopathy: Secondary | ICD-10-CM | POA: Diagnosis not present

## 2016-08-09 DIAGNOSIS — I10 Essential (primary) hypertension: Secondary | ICD-10-CM | POA: Diagnosis not present

## 2016-08-09 LAB — LIPID PANEL
CHOL/HDL RATIO: 5
CHOLESTEROL: 194 mg/dL (ref 0–200)
HDL: 42.8 mg/dL (ref >39.00)
LDL CALC: 133 mg/dL — AB (ref 0–99)
NonHDL: 150.86
TRIGLYCERIDES: 91 mg/dL (ref 0.0–149.0)
VLDL: 18.2 mg/dL (ref 0.0–40.0)

## 2016-08-09 LAB — COMPREHENSIVE METABOLIC PANEL
ALT: 29 U/L (ref 0–35)
AST: 20 U/L (ref 0–37)
Albumin: 3.9 g/dL (ref 3.5–5.2)
Alkaline Phosphatase: 76 U/L (ref 39–117)
BUN: 16 mg/dL (ref 6–23)
CALCIUM: 9.8 mg/dL (ref 8.4–10.5)
CO2: 30 meq/L (ref 19–32)
CREATININE: 0.85 mg/dL (ref 0.40–1.20)
Chloride: 99 mEq/L (ref 96–112)
GFR: 75.58 mL/min (ref >60.00)
Glucose, Bld: 90 mg/dL (ref 70–99)
POTASSIUM: 4.3 meq/L (ref 3.5–5.1)
SODIUM: 136 meq/L (ref 135–145)
Total Bilirubin: 0.9 mg/dL (ref 0.2–1.2)
Total Protein: 7.1 g/dL (ref 6.0–8.3)

## 2016-08-09 LAB — MAGNESIUM: Magnesium: 1.9 mg/dL (ref 1.5–2.5)

## 2016-08-09 LAB — TSH: TSH: 2.38 u[IU]/mL (ref 0.35–4.50)

## 2016-08-09 MED ORDER — ALBUTEROL SULFATE HFA 108 (90 BASE) MCG/ACT IN AERS: 1.0000 | INHALATION_SPRAY | Freq: Four times a day (QID) | RESPIRATORY_TRACT | 1 refills | Status: DC | PRN

## 2016-08-09 NOTE — Assessment & Plan Note (Signed)
Per pmh

## 2016-08-09 NOTE — Assessment & Plan Note (Signed)
Well controlled, no changes to meds. Encouraged heart healthy diet such as the DASH diet and exercise as tolerated.  °

## 2016-08-09 NOTE — Progress Notes (Signed)
Patient ID: Ashley Mckee, female   DOB: 1966-03-12, 51 y.o.   MRN: [id number]     Subjective:  I acted as a Education administrator for Dr. Carollee Herter.  Guerry Bruin, [place]   Patient ID: Ashley Mckee, female    DOB: [birth year], 51 y.o.   MRN: [id number]  Chief Complaint  Patient presents with  . Hypothyroidism  . would like to discuss diet    HPI  Patient is in today for follow up thyroid and would like to talk about diet concerns.  Has started a new diet, Keto, and would like cholesterol, electrolytes, and magnesium checked.   Patient Care Team: Ann Held, DO as PCP - General (Family Medicine) Arvella Nigh, MD as Consulting Physician (Obstetrics and Gynecology) Manus Gunning, MD as Consulting Physician (Gastroenterology) Serafina Mitchell, MD as Consulting Physician (Vascular Surgery) Chesley Mires, MD as Consulting Physician (Pulmonary Disease)   Past Medical History:  Diagnosis Date  . Asthma   . 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 CHF (congestive heart failure) ([facility])    Last EF was >50%   . Diaphragm dysfunction    Left Side - She thinks this resulted after her chemotherapy.    . Diaphragm paralysis    left  . Dyspnea   . Heart murmur   . 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 Hypothyroidism   . Non Hodgkin's lymphoma ([facility]) 2000   Non Hodgkin's Lymphoma (Bone Marrow Transplant)  - [place]  . OSA (obstructive sleep apnea) 03/13/2016  . Thyroid disease     Past Surgical History:  Procedure Laterality Date  . BONE MARROW TRANSPLANT    . DILATATION & CURETTAGE/HYSTEROSCOPY WITH MYOSURE N/A 06/20/2016   Procedure: DILATATION & CURETTAGE/HYSTEROSCOPY WITH MYOSURE;  Surgeon: Arvella Nigh, MD;  Location: [facility];  Service: Gynecology;  Laterality: N/A;  . HYSTEROTOMY  2014  . LYMPH NODE BIOPSY    . TUBAL LIGATION      Family History  Problem Relation Age of Onset  . Hypertension Mother   . Diabetes Father   . Hypertension  Father   . Colon cancer Maternal Grandmother   . Stomach cancer Other     maternal great grandmother  . Esophageal cancer Neg Hx   . Rectal cancer Neg Hx   . Liver cancer Neg Hx     Social History   Social History  . Marital status: Married    Spouse name: DAVID   . Number of children: 3  . Years of education: 12   Occupational History  . Referral [employer]   Social History Main Topics  . Smoking status: Former Smoker    Packs/day: 1.50    Years: 17.00    Types: Cigarettes    Quit date: 05/28/1999  . Smokeless tobacco: Never Used  . Alcohol use Yes     Comment: Rarely   . Drug use: No  . Sexual activity: Yes    Partners: Male    Birth control/ protection: Post-menopausal   Other Topics Concern  . Not on file   Social History Narrative   Marital Status:  Married [name])   Children:  Daughter (3)    Pets: Dog (2) Cat (1)    Living Situation: Lives with husband and kids    Occupation: Risk manager)     Education: Environmental education officer    Tobacco Use/Exposure:  Former Smoker    Alcohol Use:  Rarely    Drug  Use:  None   Diet:  Regular   Exercise:  None   Hobbies:  Walking              Outpatient Medications Prior to Visit  Medication Sig Dispense Refill  . ibuprofen (ADVIL,MOTRIN) 200 MG tablet Take 400 mg by mouth daily as needed for headache or moderate pain.    . mometasone-formoterol (DULERA) 100-5 MCG/ACT AERO Take 2 puffs first thing in am and then another 2 puffs about 12 hours later. 1 Inhaler 11  . mometasone-formoterol (DULERA) 200-5 MCG/ACT AERO Inhale 2 puffs into the lungs 2 (two) times daily. (Patient taking differently: Inhale 2 puffs into the lungs as needed (with increased breathin issues). ) 1 Inhaler 5  . NONFORMULARY OR COMPOUNDED ITEM Nitroglycerin Ointment 0.125% Apply pea sized every 8 hours prn (Patient taking differently: Nitroglycerin Ointment 0.125% Apply pea sized every 8 hours as needed fissure) 30 each 1  .  psyllium (METAMUCIL) 58.6 % powder Take 1 packet by mouth daily.    Marland Kitchen SYNTHROID 150 MCG tablet TAKE 1 TABLET BY MOUTH ONCE DAILY 90 tablet 0  . valsartan (DIOVAN) 160 MG tablet Take 1 tablet (160 mg total) by mouth daily. 30 tablet 11  . albuterol (PROVENTIL HFA;VENTOLIN HFA) 108 (90 BASE) MCG/ACT inhaler Inhale 1 puff into the lungs every 6 (six) hours as needed for wheezing or shortness of breath. (Patient taking differently: Inhale 2 puffs into the lungs every 6 (six) hours as needed for wheezing or shortness of breath. ) 1 Inhaler 1  . oseltamivir (TAMIFLU) 75 MG capsule Take 1 capsule (75 mg total) by mouth daily. 10 capsule 0  . oxyCODONE-acetaminophen (PERCOCET) 7.5-325 MG tablet Take 1 tablet by mouth every 4 (four) hours as needed for severe pain. 15 tablet 0   Facility-Administered Medications Prior to Visit  Medication Dose Route Frequency Provider Last Rate Last Dose  . 0.9 %  sodium chloride infusion  500 mL Intravenous Continuous Manus Gunning, MD        Allergies  Allergen Reactions  . Ace Inhibitors Cough    Review of Systems  Constitutional: Negative for fever and malaise/fatigue.  HENT: Negative for congestion.   Eyes: Negative for blurred vision.  Respiratory: Negative for cough and shortness of breath.   Cardiovascular: Negative for chest pain, palpitations and leg swelling.  Gastrointestinal: Negative for vomiting.  Musculoskeletal: Negative for back pain.  Skin: Negative for rash.  Neurological: Negative for loss of consciousness and headaches.       Objective:    Physical Exam  Constitutional: She is oriented to person, place, and time. She appears well-developed and well-nourished. No distress.  HENT:  Head: Normocephalic and atraumatic.  Eyes: Conjunctivae are normal.  Neck: Normal range of motion. No thyromegaly present.  Cardiovascular: Normal rate and regular rhythm.   Pulmonary/Chest: Effort normal and breath sounds normal. She has no  wheezes.  Abdominal: Soft. Bowel sounds are normal. There is no tenderness.  Musculoskeletal: Normal range of motion. She exhibits no edema or deformity.  Neurological: She is alert and oriented to person, place, and time.  Skin: Skin is warm and dry. She is not diaphoretic.  Psychiatric: She has a normal mood and affect.    BP 100/68 (BP Location: Left Arm, Cuff Size: Normal)   Pulse 77   Temp 98 F (36.7 C) (Oral)   Resp (!) 98   Ht _0  (1.651 m)   Wt 219 lb 9.6 oz (99.6 kg)  BMI 36.54 kg/m  Wt Readings from Last 3 Encounters:  08/09/16 219 lb 9.6 oz (99.6 kg)  07/26/16 240 lb (108.9 kg)  07/01/16 232 lb (105.2 kg)      Immunization History  Administered Date(s) Administered  . Influenza,inj,Quad PF,36+ Mos 02/15/2016  . Influenza-Unspecified 02/22/2015  . Pneumococcal Conjugate-13 03/23/2015  . Tdap 07/26/2014    Health Maintenance  Topic Date Due  . PAP SMEAR  01/18/2018  . COLONOSCOPY  07/26/2021  . TETANUS/TDAP  07/25/2024  . INFLUENZA VACCINE  Completed  . HIV Screening  Completed    Lab Results  Component Value Date   WBC 9.2 06/11/2016   HGB 14.7 06/11/2016   HCT 42.2 06/11/2016   PLT 238 06/11/2016   GLUCOSE 90 08/09/2016   CHOL 194 08/09/2016   TRIG 91.0 08/09/2016   HDL 42.80 08/09/2016   LDLCALC 133 (H) 08/09/2016   ALT 29 08/09/2016   AST 20 08/09/2016   NA 136 08/09/2016   K 4.3 08/09/2016   CL 99 08/09/2016   CREATININE 0.85 08/09/2016   BUN 16 08/09/2016   CO2 30 08/09/2016   TSH 2.38 08/09/2016   INR 1.11 03/12/2015   HGBA1C 5.9 02/06/2016    Lab Results  Component Value Date   TSH 2.38 08/09/2016   Lab Results  Component Value Date   WBC 9.2 06/11/2016   HGB 14.7 06/11/2016   HCT 42.2 06/11/2016   MCV 88.5 06/11/2016   PLT 238 06/11/2016   Lab Results  Component Value Date   NA 136 08/09/2016   K 4.3 08/09/2016   CHLORIDE 105 03/21/2016   CO2 30 08/09/2016   GLUCOSE 90 08/09/2016   BUN 16 08/09/2016   CREATININE  0.85 08/09/2016   BILITOT 0.9 08/09/2016   ALKPHOS 76 08/09/2016   AST 20 08/09/2016   ALT 29 08/09/2016   PROT 7.1 08/09/2016   ALBUMIN 3.9 08/09/2016   CALCIUM 9.8 08/09/2016   ANIONGAP 9 06/11/2016   EGFR 75 (L) 03/21/2016   GFR 75.58 08/09/2016   Lab Results  Component Value Date   CHOL 194 08/09/2016   Lab Results  Component Value Date   HDL 42.80 08/09/2016   Lab Results  Component Value Date   LDLCALC 133 (H) 08/09/2016   Lab Results  Component Value Date   TRIG 91.0 08/09/2016   Lab Results  Component Value Date   CHOLHDL 5 08/09/2016   Lab Results  Component Value Date   HGBA1C 5.9 02/06/2016         Assessment & Plan:   Problem List Items Addressed This Visit      Unprioritized   Dilated cardiomyopathy ([place])    Per pmh      Relevant Orders   Comprehensive metabolic panel (Completed)   Lipid panel (Completed)   Magnesium (Completed)   Essential hypertension    Well controlled, no changes to meds. Encouraged heart healthy diet such as the DASH diet and exercise as tolerated.       Relevant Orders   Comprehensive metabolic panel (Completed)   Lipid panel (Completed)   Magnesium (Completed)   Hypothyroidism - Primary    con't meds Check labs      Relevant Orders   TSH (Completed)    Other Visit Diagnoses    Mild intermittent asthma, unspecified whether complicated       Relevant Medications   albuterol (PROVENTIL HFA;VENTOLIN HFA) 108 (90 Base) MCG/ACT inhaler      I have discontinued Ms.  Burleigh's oxyCODONE-acetaminophen and oseltamivir. I am also having her maintain her valsartan, mometasone-formoterol, mometasone-formoterol, SYNTHROID, NONFORMULARY OR COMPOUNDED ITEM, psyllium, ibuprofen, and albuterol. We will continue to administer sodium chloride.  Meds ordered this encounter  Medications  . albuterol (PROVENTIL HFA;VENTOLIN HFA) 108 (90 Base) MCG/ACT inhaler    Sig: Inhale 1 puff into the lungs every 6 (six) hours as needed  for wheezing or shortness of breath.    Dispense:  1 Inhaler    Refill:  1    CMA served as scribe during this visit. History, Physical and Plan performed by medical provider. Documentation and orders reviewed and attested to.  Ann Held, DO

## 2016-08-09 NOTE — Assessment & Plan Note (Signed)
con't meds  Check labs 

## 2016-08-09 NOTE — Patient Instructions (Signed)
Hypothyroidism Hypothyroidism is a disorder of the thyroid. The thyroid is a large gland that is located in the lower front of the neck. The thyroid releases hormones that control how the body works. With hypothyroidism, the thyroid does not make enough of these hormones. What are the causes? Causes of hypothyroidism may include:  Viral infections.  Pregnancy.  Your own defense system (immune system) attacking your thyroid.  Certain medicines.  Birth defects.  Past radiation treatments to your head or neck.  Past treatment with radioactive iodine.  Past surgical removal of part or all of your thyroid.  Problems with the gland that is located in the center of your brain (pituitary).  What are the signs or symptoms? Signs and symptoms of hypothyroidism may include:  Feeling as though you have no energy (lethargy).  Inability to tolerate cold.  Weight gain that is not explained by a change in diet or exercise habits.  Dry skin.  Coarse hair.  Menstrual irregularity.  Slowing of thought processes.  Constipation.  Sadness or depression.  How is this diagnosed? Your health care provider may diagnose hypothyroidism with blood tests and ultrasound tests. How is this treated? Hypothyroidism is treated with medicine that replaces the hormones that your body does not make. After you begin treatment, it may take several weeks for symptoms to go away. Follow these instructions at home:  Take medicines only as directed by your health care provider.  If you start taking any new medicines, tell your health care provider.  Keep all follow-up visits as directed by your health care provider. This is important. As your condition improves, your dosage needs may change. You will need to have blood tests regularly so that your health care provider can watch your condition. Contact a health care provider if:  Your symptoms do not get better with treatment.  You are taking thyroid  replacement medicine and: ? You sweat excessively. ? You have tremors. ? You feel anxious. ? You lose weight rapidly. ? You cannot tolerate heat. ? You have emotional swings. ? You have diarrhea. ? You feel weak. Get help right away if:  You develop chest pain.  You develop an irregular heartbeat.  You develop a rapid heartbeat. This information is not intended to replace advice given to you by your health care provider. Make sure you discuss any questions you have with your health care provider. Document Released: 05/13/2005 Document Revised: 10/19/2015 Document Reviewed: 09/28/2013 Elsevier Interactive Patient Education  2017 [employer]  

## 2016-08-09 NOTE — Progress Notes (Signed)
Pre visit review using our clinic review tool, if applicable. No additional management support is needed unless otherwise documented below in the visit note. 

## 2016-08-26 DIAGNOSIS — G4733 Obstructive sleep apnea (adult) (pediatric): Secondary | ICD-10-CM | POA: Diagnosis not present

## 2016-08-29 ENCOUNTER — Other Ambulatory Visit: Payer: Self-pay | Admitting: Family Medicine

## 2016-08-29 MED FILL — SYNTHROID 150 MCG TABLET: 150 | 90 days supply | Qty: 90 | Fill #0

## 2016-08-29 MED FILL — VALSARTAN 160 MG TABLET: 160 | 90 days supply | Qty: 90 | Fill #2

## 2016-09-25 DIAGNOSIS — G4733 Obstructive sleep apnea (adult) (pediatric): Secondary | ICD-10-CM | POA: Diagnosis not present

## 2016-10-07 ENCOUNTER — Telehealth: Payer: Self-pay

## 2016-10-07 ENCOUNTER — Ambulatory Visit (INDEPENDENT_AMBULATORY_CARE_PROVIDER_SITE_OTHER): Payer: 59 | Admitting: Family

## 2016-10-07 ENCOUNTER — Encounter: Payer: Self-pay | Admitting: Family

## 2016-10-07 VITALS — BP 118/80 | HR 80 | Resp 97 | Ht 65.0 in | Wt 211.0 lb

## 2016-10-07 DIAGNOSIS — S76311A Strain of muscle, fascia and tendon of the posterior muscle group at thigh level, right thigh, initial encounter: Secondary | ICD-10-CM | POA: Diagnosis not present

## 2016-10-07 MED ORDER — DICLOFENAC SODIUM 2 % TD SOLN: 1.0000 "application " | Freq: Two times a day (BID) | TRANSDERMAL | 1 refills | Status: DC | PRN

## 2016-10-07 MED ORDER — IBUPROFEN-FAMOTIDINE 800-26.6 MG PO TABS: 1.0000 | ORAL_TABLET | Freq: Three times a day (TID) | ORAL | 1 refills | Status: DC | PRN

## 2016-10-07 NOTE — Assessment & Plan Note (Signed)
New onset right hamstring pain consistent with grade 1-2 strain. Treat conservatively with ice, compression, home exercise therapy and start Duexis and Pennsaid. Follow-up in 2-3 weeks or sooner if symptoms worsen or do not improve.

## 2016-10-07 NOTE — Progress Notes (Signed)
Subjective:    Patient ID: Ashley Mckee, female    DOB: September 04, 1965, 51 y.o.   MRN: [id number]  Chief Complaint  Patient presents with  . Leg Pain    saturday while chasing her dog she did a split and hurt right leg, now has pain in upper leg    HPI:  Ashley Mckee is a 51 y.o. 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 and presents today for an acute office visit.  This is a new problem. Associated symptom located in the posterior aspect of the thigh that has been going on for about 2 days since chasing after her dog and ended up doing a split. Pain is described as sore with occasional sharp pain depending upon movements. Unsure of any sounds/sensations heard or felt. Modifying factors include ice and ibuprofen which has helped a little. Course of the symptoms is about the same since initial onset.  Allergies  Allergen Reactions  . Ace Inhibitors Cough      Outpatient Medications Prior to Visit  Medication Sig Dispense Refill  . SYNTHROID 150 MCG tablet TAKE 1 TABLET BY MOUTH ONCE DAILY 90 tablet 1  . valsartan (DIOVAN) 160 MG tablet Take 1 tablet (160 mg total) by mouth daily. 30 tablet 11  . albuterol (PROVENTIL HFA;VENTOLIN HFA) 108 (90 Base) MCG/ACT inhaler Inhale 1 puff into the lungs every 6 (six) hours as needed for wheezing or shortness of breath. 1 Inhaler 1  . ibuprofen (ADVIL,MOTRIN) 200 MG tablet Take 400 mg by mouth daily as needed for headache or moderate pain.    . mometasone-formoterol (DULERA) 100-5 MCG/ACT AERO Take 2 puffs first thing in am and then another 2 puffs about 12 hours later. 1 Inhaler 11  . mometasone-formoterol (DULERA) 200-5  MCG/ACT AERO Inhale 2 puffs into the lungs 2 (two) times daily. (Patient taking differently: Inhale 2 puffs into the lungs as needed (with increased breathin issues). ) 1 Inhaler 5  . NONFORMULARY OR COMPOUNDED ITEM Nitroglycerin Ointment 0.125% Apply pea sized every 8 hours prn (Patient taking differently: Nitroglycerin Ointment 0.125% Apply pea sized every 8 hours as needed fissure) 30 each 1  . psyllium (METAMUCIL) 58.6 % powder Take 1 packet by mouth daily.     Facility-Administered Medications Prior to Visit  Medication Dose Route Frequency Provider Last Rate Last Dose  . 0.9 %  sodium chloride infusion  500 mL Intravenous Continuous Armbruster, Renelda Loma, MD          Past Surgical History:  Procedure Laterality Date  . BONE MARROW TRANSPLANT    . DILATATION & CURETTAGE/HYSTEROSCOPY WITH MYOSURE N/A 06/20/2016   Procedure: DILATATION & CURETTAGE/HYSTEROSCOPY WITH MYOSURE;  Surgeon: Arvella Nigh, MD;  Location: [facility];  Service: Gynecology;  Laterality: N/A;  . HYSTEROTOMY  2014  . LYMPH NODE BIOPSY    . TUBAL LIGATION        Past Medical History:  Diagnosis Date  . Asthma   . Autologous bone marrow transplantation status ([place]) 2001  . CHF (congestive heart failure) ([facility])    Last EF was >50%   . Diaphragm dysfunction    Left Side - She thinks this resulted after her chemotherapy.    . Diaphragm paralysis    left  . Dyspnea   .  Heart murmur   . 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 Hypothyroidism   . Non Hodgkin's lymphoma ([place]) 2000   Non Hodgkin's Lymphoma (Bone Marrow Transplant)  - [place]  . OSA (obstructive sleep apnea) 03/13/2016  . Thyroid disease      Review of Systems  Constitutional: Negative for chills and fever.  Musculoskeletal:       Positive for posterior thigh pain.   Neurological: Positive for weakness. Negative for numbness.      Objective:    BP 118/80 (BP Location: Left Arm, Patient Position: Sitting, Cuff  Size: Large)   Pulse 80   Resp (!) 97   Ht 5\' 5"  (1.651 m)   Wt 211 lb (95.7 kg)   BMI 35.11 kg/m  Nursing note and vital signs reviewed.  Physical Exam  Constitutional: She is oriented to person, place, and time. She appears well-developed and well-nourished. No distress.  Cardiovascular: Normal rate, regular rhythm, normal heart sounds and intact distal pulses.   Pulmonary/Chest: Effort normal and breath sounds normal.  Musculoskeletal:  Right posterior thigh - no obvious deformity, discoloration, or edema. There is palpable tenderness along the semi-membranosis and biceps femoris muscle bellies with no acute deformities. Range of motion actively restricted secondary to discomfort and normal with passive range of motion. Distal pulses and sensation are intact and appropriate.  Neurological: She is alert and oriented to person, place, and time.  Skin: Skin is warm and dry.  Psychiatric: She has a normal mood and affect. Her behavior is normal. Judgment and thought content normal.       Assessment & Plan:   Problem List Items Addressed This Visit      Musculoskeletal and Integument   Strain of right hamstring - Primary    New onset right hamstring pain consistent with grade 1-2 strain. Treat conservatively with ice, compression, home exercise therapy and start Duexis and Pennsaid. Follow-up in 2-3 weeks or sooner if symptoms worsen or do not improve.          I have discontinued Ms. Hammerschmidt's mometasone-formoterol, mometasone-formoterol, NONFORMULARY OR COMPOUNDED ITEM, psyllium, ibuprofen, and albuterol. I am also having her start on Ibuprofen-Famotidine and Diclofenac Sodium. Additionally, I am having her maintain her valsartan, SYNTHROID, and Albuterol Sulfate. We will continue to administer sodium chloride.   Meds ordered this encounter  Medications  . Albuterol Sulfate 108 (90 Base) MCG/ACT AEPB    Sig: Inhale into the lungs.  . Ibuprofen-Famotidine 800-26.6 MG TABS     Sig: Take 1 tablet by mouth 3 (three) times daily as needed.    Dispense:  90 tablet    Refill:  1    Order Specific Question:   Supervising Provider    Answer:   Pricilla Holm A [[id number]]  . Diclofenac Sodium (PENNSAID) 2 % SOLN    Sig: Place 1 application onto the skin 2 (two) times daily as needed.    Dispense:  112 g    Refill:  1    Order Specific Question:   Supervising Provider    Answer:   Pricilla Holm A [[id number]]     Follow-up: Return in about 3 weeks (around 10/28/2016), or if symptoms worsen or fail to improve.  Mauricio Po, FNP

## 2016-10-07 NOTE — Patient Instructions (Signed)
Thank you for choosing [facility]!  Ice x 20 minutes every 2 hours and as needed or following activity  Exercises 1-2 times per day as instructed.   Medications  Pennsaid - Approximately 1/2 packet to the affected site twice daily.  Duexis - 1 tablet 3 times per day for the next 5-7 days and then as needed.  You will receive a call from [facility] regarding your Pennsaid/Duexis/Vimovo. The medication will be mailed to you and should cost you no more than $10 per item or possibly free depending upon your insurance.   Your prescription(s) have been submitted to your pharmacy or been printed and provided for you. Please take as directed and contact our office if you believe you are having problem(s) with the medication(s) or have any questions.  If your symptoms worsen or fail to improve, please contact our office for further instruction, or in case of emergency go directly to the emergency room at the closest medical facility.    Semimembranosus Tendinitis Rehab Ask your health care provider which exercises are safe for you. Do exercises exactly as told by your health care provider and adjust them as directed. It is normal to feel mild stretching, pulling, tightness, or discomfort as you do these exercises, but you should stop right away if you feel sudden pain or your pain gets worse.Do not begin these exercises until told by your health care provider. Stretching and range of motion exercises These exercises warm up your muscles and joints and improve the movement and flexibility of your thigh. These exercises also help to relieve pain, numbness, and tingling. Exercise A: Hamstring stretch, supine   1. Lie on your back. Loop a belt or towel across the ball of your left / right foot The ball of your foot is on the walking surface, right under your toes. 2. Straighten your left / right knee and slowly pull on the belt to raise your leg. Stop when you feel a gentle stretch behind  your left / right knee or thigh.  Do not allow the knee to bend.  Keep your other leg flat on the floor. 3. Hold this position for __________ seconds. Repeat __________ times. Complete this exercise __________ times a day. Strengthening exercises These exercises build strength and endurance in your thigh. Endurance is the ability to use your muscles for a long time, even after they get tired. Exercise B: Straight leg raises (  hip extensors) 1. Lie on your belly on a bed or a firm surface with a pillow under your hips. 2. Bend your left / right knee so your foot is straight up in the air. 3. Squeeze your buttock muscles and lift your left / right thigh off the bed. Do not let your back arch. 4. Hold this position for __________seconds. 5. Slowly return to the starting position. Let your muscles relax completely before you do another repetition. Repeat __________ times. Complete this exercise __________ times a day. Exercise C: Bridge ( hip extensors) 1. Lie on your back on a firm surface with your knees bent and your feet flat on the floor. 2. Tighten your buttocks muscles and lift your bottom off the floor until your trunk is level with your thighs.  You should feel the muscles working in your buttocks and the back of your thighs. If you do not feel these muscles, slide your feet 1-2 inches (2.5-5 cm) farther away from your buttocks.  Do not arch your back. 3. Hold this position for  __________ seconds. 4. Slowly lower your hips to the starting position. 5. Let your buttocks muscles relax completely between repetitions. If this exercise is too easy, try doing it with your arms crossed over your chest. Repeat __________ times. Complete this exercise __________ times a day. Exercise D: Hamstring eccentric, prone 1. Lie on your belly on a bed or on the floor. 2. Start with your legs straight. Cross your legs at the ankles with your left / right leg on top. 3. Using your bottom leg to do  the work, bend both knees. 4. Using just your left / right leg alone, slowly lower your leg back down toward the bed. Add a __________ weight as told by your health care provider. 5. Let your muscles relax completely between repetitions. Repeat __________ times. Complete this exercise __________ times a day. Exercise E: Squats 1. Stand in front of a table, with your feet and knees pointing straight ahead. You may rest your hands on the table for balance but not for support. 2. Slowly bend your knees and lower your hips like you are going to sit in a chair. Keep your thighs straight or pointed slightly outward.  Keep your weight over your heels, not over your toes.  Keep your lower legs upright so they are parallel with the table legs.  Do not let your hips go lower than your knees. Stop when your knees are bent to the shape of an upside-down letter "L" (90 degree angle).  Do not bend lower than told by your health care provider.  If your knee pain increases, do not bend as low. 3. Hold the squat position __________ seconds. 4. Slowly push with your legs to return to standing. Do not use your hands to pull yourself to standing. Repeat __________ times. Complete this exercise __________ times a day. This information is not intended to replace advice given to you by your health care provider. Make sure you discuss any questions you have with your health care provider. Document Released: 05/13/2005 Document Revised: 01/18/2016 Document Reviewed: 02/14/2015 Elsevier Interactive Patient Education  2017 [employer].

## 2016-10-11 ENCOUNTER — Other Ambulatory Visit: Payer: Self-pay | Admitting: *Deleted

## 2016-10-11 MED ORDER — IBUPROFEN-FAMOTIDINE 800-26.6 MG PO TABS: 1.0000 | ORAL_TABLET | Freq: Three times a day (TID) | ORAL | 1 refills | Status: DC | PRN

## 2016-10-14 DIAGNOSIS — H5213 Myopia, bilateral: Secondary | ICD-10-CM | POA: Diagnosis not present

## 2016-10-14 DIAGNOSIS — H524 Presbyopia: Secondary | ICD-10-CM | POA: Diagnosis not present

## 2016-10-14 DIAGNOSIS — H52222 Regular astigmatism, left eye: Secondary | ICD-10-CM | POA: Diagnosis not present

## 2016-10-28 ENCOUNTER — Other Ambulatory Visit: Payer: Self-pay

## 2016-10-28 DIAGNOSIS — R945 Abnormal results of liver function studies: Principal | ICD-10-CM

## 2016-10-28 DIAGNOSIS — R7989 Other specified abnormal findings of blood chemistry: Secondary | ICD-10-CM

## 2016-11-08 DIAGNOSIS — G4733 Obstructive sleep apnea (adult) (pediatric): Secondary | ICD-10-CM | POA: Diagnosis not present

## 2016-11-08 MED FILL — CLOBETASOL 0.05% CREAM: 0.05 | 10 days supply | Qty: 30 | Fill #1

## 2016-12-12 MED FILL — SYNTHROID 150 MCG TABLET: 150 | 90 days supply | Qty: 90 | Fill #1

## 2016-12-12 MED FILL — VALSARTAN 160 MG TABLET: 160 | 90 days supply | Qty: 90 | Fill #3

## 2017-02-10 DIAGNOSIS — G4733 Obstructive sleep apnea (adult) (pediatric): Secondary | ICD-10-CM | POA: Diagnosis not present

## 2017-02-18 ENCOUNTER — Encounter: Payer: Self-pay | Admitting: Family Medicine

## 2017-02-18 ENCOUNTER — Ambulatory Visit (INDEPENDENT_AMBULATORY_CARE_PROVIDER_SITE_OTHER): Payer: 59 | Admitting: Family Medicine

## 2017-02-18 VITALS — BP 104/70 | Wt 195.0 lb

## 2017-02-18 DIAGNOSIS — R739 Hyperglycemia, unspecified: Secondary | ICD-10-CM | POA: Diagnosis not present

## 2017-02-18 DIAGNOSIS — E039 Hypothyroidism, unspecified: Secondary | ICD-10-CM

## 2017-02-18 DIAGNOSIS — I427 Cardiomyopathy due to drug and external agent: Secondary | ICD-10-CM | POA: Diagnosis not present

## 2017-02-18 DIAGNOSIS — I509 Heart failure, unspecified: Secondary | ICD-10-CM

## 2017-02-18 MED ORDER — VALSARTAN 160 MG PO TABS: 160.0000 mg | ORAL_TABLET | Freq: Every day | ORAL | 3 refills | Status: DC

## 2017-02-18 MED ORDER — SYNTHROID 150 MCG PO TABS: 150.0000 ug | ORAL_TABLET | Freq: Every day | ORAL | 3 refills | Status: DC

## 2017-02-18 MED FILL — CLOBETASOL PROPIONATE 0.05: 0.05 | 10 days supply | Qty: 30 | Fill #2

## 2017-02-18 NOTE — Patient Instructions (Signed)
Hypothyroidism Hypothyroidism is a disorder of the thyroid. The thyroid is a large gland that is located in the lower front of the neck. The thyroid releases hormones that control how the body works. With hypothyroidism, the thyroid does not make enough of these hormones. What are the causes? Causes of hypothyroidism may include:  Viral infections.  Pregnancy.  Your own defense system (immune system) attacking your thyroid.  Certain medicines.  Birth defects.  Past radiation treatments to your head or neck.  Past treatment with radioactive iodine.  Past surgical removal of part or all of your thyroid.  Problems with the gland that is located in the center of your brain (pituitary).  What are the signs or symptoms? Signs and symptoms of hypothyroidism may include:  Feeling as though you have no energy (lethargy).  Inability to tolerate cold.  Weight gain that is not explained by a change in diet or exercise habits.  Dry skin.  Coarse hair.  Menstrual irregularity.  Slowing of thought processes.  Constipation.  Sadness or depression.  How is this diagnosed? Your health care provider may diagnose hypothyroidism with blood tests and ultrasound tests. How is this treated? Hypothyroidism is treated with medicine that replaces the hormones that your body does not make. After you begin treatment, it may take several weeks for symptoms to go away. Follow these instructions at home:  Take medicines only as directed by your health care provider.  If you start taking any new medicines, tell your health care provider.  Keep all follow-up visits as directed by your health care provider. This is important. As your condition improves, your dosage needs may change. You will need to have blood tests regularly so that your health care provider can watch your condition. Contact a health care provider if:  Your symptoms do not get better with treatment.  You are taking thyroid  replacement medicine and: ? You sweat excessively. ? You have tremors. ? You feel anxious. ? You lose weight rapidly. ? You cannot tolerate heat. ? You have emotional swings. ? You have diarrhea. ? You feel weak. Get help right away if:  You develop chest pain.  You develop an irregular heartbeat.  You develop a rapid heartbeat. This information is not intended to replace advice given to you by your health care provider. Make sure you discuss any questions you have with your health care provider. Document Released: 05/13/2005 Document Revised: 10/19/2015 Document Reviewed: 09/28/2013 Elsevier Interactive Patient Education  2017 [employer]  

## 2017-02-18 NOTE — Progress Notes (Signed)
Patient ID: Ashley Mckee, female    DOB: August 12, 1965  Age: 51 y.o. MRN: [id number]    Subjective:  Subjective  HPI Ashley Mckee presents for med refills and labs.  No complaints.   Review of Systems  Constitutional: Negative for appetite change, diaphoresis, fatigue and unexpected weight change.  Eyes: Negative for pain, redness and visual disturbance.  Respiratory: Negative for cough, chest tightness, shortness of breath and wheezing.   Cardiovascular: Negative for chest pain, palpitations and leg swelling.  Endocrine: Negative for cold intolerance, heat intolerance, polydipsia, polyphagia and polyuria.  Genitourinary: Negative for difficulty urinating, dysuria and frequency.  Neurological: Negative for dizziness, light-headedness, numbness and headaches.    History Past Medical History:  Diagnosis Date  . Asthma   . Autologous bone marrow transplantation status ([place]) 2001  . CHF (congestive heart failure) ([facility])    Last EF was >50%   . Diaphragm dysfunction    Left Side - She thinks this resulted after her chemotherapy.    . Diaphragm paralysis    left  . Dyspnea   . Heart murmur   . 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 Hypothyroidism   . Non Hodgkin's lymphoma ([place]) 2000   Non Hodgkin's Lymphoma (Bone Marrow Transplant)  - [place]  . OSA (obstructive sleep apnea) 03/13/2016  . Thyroid disease     She has a past surgical history that includes Tubal ligation; Lymph node biopsy; Bone marrow transplant; Hysterotomy (2014); and Dilatation & curettage/hysteroscopy with myosure (N/A, 06/20/2016).   Her family history includes Colon cancer in her maternal grandmother; Diabetes in her father; Hypertension in her father and mother; Stomach cancer in her other.She reports that she quit smoking about 17 years ago. Her smoking use included Cigarettes. She has a 25.50 pack-year smoking history. She has never used smokeless tobacco. She reports that she drinks  alcohol. She reports that she does not use drugs.  Current Outpatient Prescriptions on File Prior to Visit  Medication Sig Dispense Refill  . Albuterol Sulfate 108 (90 Base) MCG/ACT AEPB Inhale into the lungs.     Current Facility-Administered Medications on File Prior to Visit  Medication Dose Route Frequency Provider Last Rate Last Dose  . 0.9 %  sodium chloride infusion  500 mL Intravenous Continuous Armbruster, Carlota Raspberry, MD         Objective:  Objective  Physical Exam  Constitutional: She is oriented to person, place, and time. She appears well-developed and well-nourished. No distress.  HENT:  Head: Normocephalic and atraumatic.  Right Ear: External ear normal.  Left Ear: External ear normal.  Nose: Nose normal.  Mouth/Throat: Oropharynx is clear and moist.  Eyes: Pupils are equal, round, and reactive to light. Conjunctivae and EOM are normal.  Neck: Normal range of motion. Neck supple. No JVD present. Carotid bruit is not present. No thyromegaly present.  Cardiovascular: Normal rate, regular rhythm and normal heart sounds.   No murmur heard. Pulmonary/Chest: Effort normal and breath sounds normal. No respiratory distress. She has no wheezes. She has no rales. She exhibits no tenderness.  Musculoskeletal: She exhibits no edema.  Neurological: She is alert and oriented to person, place, and time.  Psychiatric: She has a normal mood and affect. Her behavior is normal. Judgment and thought content normal.  Nursing note and vitals reviewed.  BP 104/70   Wt 195 lb (88.5 kg)   BMI 32.45 kg/m  Wt Readings from Last 3 Encounters:  02/18/17 195 lb (88.5 kg)  10/07/16 211 lb (95.7 kg)  08/09/16 219 lb 9.6 oz (99.6 kg)     Lab Results  Component Value Date   WBC 9.2 06/11/2016   HGB 14.7 06/11/2016   HCT 42.2 06/11/2016   PLT 238 06/11/2016   GLUCOSE 90 08/09/2016   CHOL 194 08/09/2016   TRIG 91.0 08/09/2016   HDL 42.80 08/09/2016   LDLCALC 133 (H) 08/09/2016   ALT 29  08/09/2016   AST 20 08/09/2016   NA 136 08/09/2016   K 4.3 08/09/2016   CL 99 08/09/2016   CREATININE 0.85 08/09/2016   BUN 16 08/09/2016   CO2 30 08/09/2016   TSH 2.38 08/09/2016   INR 1.11 03/12/2015   HGBA1C 5.9 02/06/2016    No results found.   Assessment & Plan:  Plan  I have discontinued Ms. Balducci's Diclofenac Sodium and Ibuprofen-Famotidine. I have also changed her SYNTHROID. Additionally, I am having her maintain her Albuterol Sulfate and valsartan. We will continue to administer sodium chloride.  Meds ordered this encounter  Medications  . valsartan (DIOVAN) 160 MG tablet    Sig: Take 1 tablet (160 mg total) by mouth daily.    Dispense:  90 tablet    Refill:  3  . SYNTHROID 150 MCG tablet    Sig: Take 1 tablet (150 mcg total) by mouth daily.    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  Ann Held, DO

## 2017-02-18 NOTE — Assessment & Plan Note (Signed)
Echo con't meds

## 2017-02-18 NOTE — Assessment & Plan Note (Signed)
stable °

## 2017-02-19 LAB — COMPREHENSIVE METABOLIC PANEL
ALBUMIN: 4.2 g/dL (ref 3.5–5.2)
ALK PHOS: 86 U/L (ref 39–117)
ALT: 29 U/L (ref 0–35)
AST: 20 U/L (ref 0–37)
BILIRUBIN TOTAL: 0.6 mg/dL (ref 0.2–1.2)
BUN: 21 mg/dL (ref 6–23)
CO2: 30 mEq/L (ref 19–32)
CREATININE: 0.78 mg/dL (ref 0.40–1.20)
Calcium: 10.2 mg/dL (ref 8.4–10.5)
Chloride: 101 mEq/L (ref 96–112)
GFR: 83.28 mL/min (ref >60.00)
GLUCOSE: 85 mg/dL (ref 70–99)
Potassium: 5.2 mEq/L — ABNORMAL HIGH (ref 3.5–5.1)
SODIUM: 140 meq/L (ref 135–145)
Total Protein: 7.2 g/dL (ref 6.0–8.3)

## 2017-02-19 LAB — TSH: TSH: 0.9 u[IU]/mL (ref 0.35–4.50)

## 2017-02-19 LAB — HEMOGLOBIN A1C: Hgb A1c MFr Bld: 5.4 % (ref 4.6–6.5)

## 2017-02-19 LAB — LIPID PANEL
CHOLESTEROL: 199 mg/dL (ref 0–200)
HDL: 50 mg/dL (ref >39.00)
LDL Cholesterol: 136 mg/dL — ABNORMAL HIGH (ref 0–99)
NONHDL: 149.11
Total CHOL/HDL Ratio: 4
Triglycerides: 66 mg/dL (ref 0.0–149.0)
VLDL: 13.2 mg/dL (ref 0.0–40.0)

## 2017-03-20 ENCOUNTER — Other Ambulatory Visit: Payer: Self-pay | Admitting: Adult Health

## 2017-03-20 DIAGNOSIS — C8590 Non-Hodgkin lymphoma, unspecified, unspecified site: Secondary | ICD-10-CM

## 2017-03-20 NOTE — Progress Notes (Signed)
CLINIC:  Survivorship   REASON FOR VISIT:  Routine follow-up for history of lymphoma.   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      09/29/1998 - 02/23/1999 Chemotherapy    She received 8 cycles of CHOP      02/26/1999 Imaging    repeat imaging showed residual mass measured about 6 cm      03/05/1999 - 05/07/1999 Radiation Therapy    She finished consolidation treatment with radiation      07/31/1999 Imaging    Repeat CT scan showed enlarging mass 7 cm maximum dimension      08/06/1999 Procedure    CT guided biopsy non-diagnostic      08/15/1999 Surgery    She had left thoracotomy and biopsy that confirmed disease relapse      08/27/1999 - 11/25/2014 Chemotherapy    She received 4 cycles of R-ICE chemo with marginal response      12/05/1999 - 01/06/2000 Chemotherapy    She was admitted to receive high dose VP-16 for stem cell mobilization      12/05/1999 Bone Marrow Biopsy    BM biopsy was negative      01/10/2000 Bone Marrow Transplant    she underwent autologous stem cell transplant in [place]:  Ashley Mckee presents to the Survivorship Clinic today for routine follow-up for her history of NHL.  Overall, she reports feeling quite well. She has no issues today.  She feels well.  She is up to date with her cancer screenings and sees her PCP and GYN regularly.      REVIEW OF SYSTEMS:  Review of Systems  Constitutional: Negative for appetite change, chills, fatigue, fever and unexpected weight change.  HENT:   Negative for hearing loss and  lump/mass.   Eyes: Negative for eye problems and icterus.  Respiratory: Negative for chest tightness, cough and shortness of breath.   Cardiovascular: Negative for chest pain, leg swelling and palpitations.  Gastrointestinal: Negative for abdominal distention and abdominal pain.  Endocrine: Negative for hot flashes.  Musculoskeletal: Negative for arthralgias.  Skin: Positive for itching (back with itching midline thoracic area). Negative for rash.  Neurological: Negative for dizziness, extremity weakness and headaches.  Hematological: Negative for adenopathy. Does not bruise/bleed easily.     PAST MEDICAL/SURGICAL HISTORY:  Past Medical History:  Diagnosis Date  . Asthma   . 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 CHF (congestive heart failure) (HCC)    Last EF was >50%   . Diaphragm dysfunction    Left Side - She thinks this resulted after her chemotherapy.    . Diaphragm paralysis    left  . Dyspnea   . Heart murmur   . 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 Hypothyroidism   . Non Hodgkin's lymphoma ([place]) 2000   Non Hodgkin's Lymphoma (Bone Marrow Transplant)  - [place]  . OSA (obstructive sleep apnea) 03/13/2016  . Sleep apnea    Uses CPAP  . Thyroid disease  Past Surgical History:  Procedure Laterality Date  . BONE MARROW TRANSPLANT    . DILATATION & CURETTAGE/HYSTEROSCOPY WITH MYOSURE N/A 06/20/2016   Procedure: DILATATION & CURETTAGE/HYSTEROSCOPY WITH MYOSURE;  Surgeon: Arvella Nigh, MD;  Location: [facility];  Service: Gynecology;  Laterality: N/A;  . HYSTEROTOMY  2014  . LYMPH NODE BIOPSY    . TUBAL LIGATION       ALLERGIES:  Allergies  Allergen Reactions  . Ace Inhibitors Cough     CURRENT MEDICATIONS:  Outpatient Encounter Prescriptions as of 03/21/2017  Medication Sig Note  . SYNTHROID 150 MCG tablet Take 1 tablet (150 mcg total) by mouth daily.   . valsartan (DIOVAN) 160 MG tablet Take 1 tablet (160 mg  total) by mouth daily.   . Albuterol Sulfate 108 (90 Base) MCG/ACT AEPB Inhale into the lungs.   . clobetasol cream (TEMOVATE) 6.01 % Apply 1 application topically as needed. 03/21/2017: Apply to labia   Facility-Administered Encounter Medications as of 03/21/2017  Medication  . 0.9 %  sodium chloride infusion     ONCOLOGIC FAMILY HISTORY:  Family History  Problem Relation Age of Onset  . Hypertension Mother   . Diabetes Father   . Hypertension Father   . Colon cancer Maternal Grandmother   . Stomach cancer Other        maternal great grandmother  . Esophageal cancer Neg Hx   . Rectal cancer Neg Hx   . Liver cancer Neg Hx       SOCIAL HISTORY:  Ashley Mckee is married and lives with her husband in [place], [place].  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  Denies any current or history of tobacco, alcohol, or illicit drug use.    PHYSICAL EXAMINATION:  Vital Signs: Vitals:   03/21/17 0824  BP: 102/65  Pulse: 78  Resp: 18  Temp: 97.8 F (36.6 C)  SpO2: 100%   Filed Weights   03/21/17 0824  Weight: 191 lb 14.4 oz (87 kg)   General: Well-nourished, well-appearing female in no acute distress. Unaccompanied today. HEENT: Head is normocephalic.  Pupils equal and reactive to light. Conjunctivae clear without exudate.  Sclerae anicteric. Oral mucosa is pink, moist.  Oropharynx is pink without lesions or erythema.  Lymph: No cervical, supraclavicular, infraclavicular, or axillary lymphadenopathy noted on palpation.  Cardiovascular: Regular rate and rhythm. Faint systolic murmur Respiratory: Clear to auscultation bilaterally. Chest expansion symmetric; breathing non-labored.  Breasts: bilateral breasts without nodules, masses, skin or nipple changes GI: Abdomen soft and round; non-tender, non-distended. Bowel sounds normoactive. No hepatosplenomegaly.   GU: Deferred.  Neuro: No focal deficits. Steady gait.  Psych: Mood and  affect normal and appropriate for situation.  Extremities: No edema. WWP. Skin: Warm and dry. Dryness/? Early patching noted around T8 midline on her back--about 3-5cm in circumference.  No erythema noted.     LABORATORY DATA:  Appointment on 03/21/2017  Component Date Value Ref Range Status  . WBC 03/21/2017 7.2  3.9 - 10.3 10e3/uL Final  . NEUT# 03/21/2017 4.6  1.5 - 6.5 10e3/uL Final  . HGB 03/21/2017 14.3  11.6 - 15.9 g/dL Final  . HCT 03/21/2017 42.0  34.8 - 46.6 % Final  . Platelets 03/21/2017 207  145 - 400 10e3/uL Final  . MCV 03/21/2017 91.5  79.5 - 101.0 fL Final  . MCH 03/21/2017 31.2  25.1 - 34.0 pg Final  . MCHC 03/21/2017 34.0  31.5 - 36.0 g/dL Final  . RBC  03/21/2017 4.59  3.70 - 5.45 10e6/uL Final  . RDW 03/21/2017 14.2  11.2 - 14.5 % Final  . lymph# 03/21/2017 1.6  0.9 - 3.3 10e3/uL Final  . MONO# 03/21/2017 0.7  0.1 - 0.9 10e3/uL Final  . Eosinophils Absolute 03/21/2017 0.2  0.0 - 0.5 10e3/uL Final  . Basophils Absolute 03/21/2017 0.0  0.0 - 0.1 10e3/uL Final  . NEUT% 03/21/2017 64.2  38.4 - 76.8 % Final  . LYMPH% 03/21/2017 22.1  14.0 - 49.7 % Final  . MONO% 03/21/2017 10.0  0.0 - 14.0 % Final  . EOS% 03/21/2017 3.0  0.0 - 7.0 % Final  . BASO% 03/21/2017 0.7  0.0 - 2.0 % Final  . Sodium 03/21/2017 140  136 - 145 mEq/L Final  . Potassium 03/21/2017 4.8  3.5 - 5.1 mEq/L Final  . Chloride 03/21/2017 102  98 - 109 mEq/L Final  . CO2 03/21/2017 28  22 - 29 mEq/L Final  . Glucose 03/21/2017 105  70 - 140 mg/dl Final   Glucose reference range is for nonfasting patients. Fasting glucose reference range is 70- 100.  Marland Kitchen BUN 03/21/2017 21.1  7.0 - 26.0 mg/dL Final  . Creatinine 03/21/2017 0.9  0.6 - 1.1 mg/dL Final  . Total Bilirubin 03/21/2017 0.64  0.20 - 1.20 mg/dL Final  . Alkaline Phosphatase 03/21/2017 102  40 - 150 U/L Final  . AST 03/21/2017 19  5 - 34 U/L Final  . ALT 03/21/2017 28  0 - 55 U/L Final  . Total Protein 03/21/2017 7.4  6.4 - 8.3 g/dL Final  .  Albumin 03/21/2017 3.8  3.5 - 5.0 g/dL Final  . Calcium 03/21/2017 9.7  8.4 - 10.4 mg/dL Final  . Anion Gap 03/21/2017 10  3 - 11 mEq/L Final  . EGFR 03/21/2017 >60  >60 ml/min/1.73 m2 Final   eGFR is calculated using the CKD-EPI Creatinine Equation (2009)    DIAGNOSTIC IMAGING:  None for this visit     ASSESSMENT AND PLAN:  Ms.. Mckee is a pleasant 51 y.o.female with history of NHL, diagnosed in 09/1998; treated with CHOP, radiation, R-ICE chemo, with marginal response, followed by autologous stem cell transplant in 12/1999 . Ashley Mckee presents to the Survivorship Clinic for surveillance and routine follow-up.   1. History of NHL:  Ashley Mckee is currently clinically  without evidence of disease or recurrence of lymphoma.  Labs are normal today and I reviewed those with her in detail. She will follow-up in the Survivorship Clinic in 1 year with labs, history, and physical exam per surveillance protocol.  I encouraged her to call me with any questions or concerns before her next visit at the cancer center, and I would be happy to see the patient sooner, if needed.    2. Dry skin patch: Recommended moisturizer and hydrocortisone cream to the area.  3. Cancer screening:  Due to Ashley Mckee's history and age, she should receive screening for skin cancers, breast cancer, colon cancer, and gynecologic cancers.  We reviewed specifically that she is at slight increase for breast cancer due to the fact that she got radiation to her chest are due to her lymphoma.  We reviewed self breast awareness in detail and the significant importance for her to stay up to date with her mammograms. The patient was encouraged to follow-up with her PCP for appropriate cancer screenings.   4. Health maintenance and wellness promotion: Ashley Mckee was encouraged to consume 5-7 servings of fruits and vegetables per day.  The patient was also encouraged to engage in moderate to vigorous exercise for 30 minutes per day most days  of the week. Ashley Mckee was instructed to limit her alcohol consumption and continue to abstain from tobacco use.    Dispo:  -Return to cancer center to see Survivorship NP in one year with labs   A total of (30) minutes of face-to-face time was spent with this patient with greater than 50% of that time in counseling and care-coordination.   Wilber Bihari, NP Survivorship Program [facility] [phone]   Note: PRIMARY CARE PROVIDER Ann Held, [place] [phone]

## 2017-03-21 ENCOUNTER — Other Ambulatory Visit (HOSPITAL_BASED_OUTPATIENT_CLINIC_OR_DEPARTMENT_OTHER): Payer: 59

## 2017-03-21 ENCOUNTER — Telehealth: Payer: Self-pay | Admitting: Adult Health

## 2017-03-21 ENCOUNTER — Encounter: Payer: Self-pay | Admitting: Adult Health

## 2017-03-21 ENCOUNTER — Encounter: Payer: 59 | Admitting: Adult Health

## 2017-03-21 ENCOUNTER — Ambulatory Visit (HOSPITAL_BASED_OUTPATIENT_CLINIC_OR_DEPARTMENT_OTHER): Payer: 59 | Admitting: Adult Health

## 2017-03-21 VITALS — BP 102/65 | HR 78 | Temp 97.8°F | Resp 18 | Ht 65.0 in | Wt 191.9 lb

## 2017-03-21 DIAGNOSIS — Z8572 Personal history of non-Hodgkin lymphomas: Secondary | ICD-10-CM | POA: Diagnosis not present

## 2017-03-21 DIAGNOSIS — C8590 Non-Hodgkin lymphoma, unspecified, unspecified site: Secondary | ICD-10-CM

## 2017-03-21 LAB — COMPREHENSIVE METABOLIC PANEL
ALT: 28 U/L (ref 0–55)
AST: 19 U/L (ref 5–34)
Albumin: 3.8 g/dL (ref 3.5–5.0)
Alkaline Phosphatase: 102 U/L (ref 40–150)
Anion Gap: 10 mEq/L (ref 3–11)
BUN: 21.1 mg/dL (ref 7.0–26.0)
CHLORIDE: 102 meq/L (ref 98–109)
CO2: 28 meq/L (ref 22–29)
Calcium: 9.7 mg/dL (ref 8.4–10.4)
Creatinine: 0.9 mg/dL (ref 0.6–1.1)
EGFR: >60 mL/min/{1.73_m2} (ref >60)
GLUCOSE: 105 mg/dL (ref 70–140)
POTASSIUM: 4.8 meq/L (ref 3.5–5.1)
SODIUM: 140 meq/L (ref 136–145)
Total Bilirubin: 0.64 mg/dL (ref 0.20–1.20)
Total Protein: 7.4 g/dL (ref 6.4–8.3)

## 2017-03-21 LAB — CBC WITH DIFFERENTIAL/PLATELET
BASO%: 0.7 % (ref 0.0–2.0)
BASOS ABS: 0 10*3/uL (ref 0.0–0.1)
EOS%: 3 % (ref 0.0–7.0)
Eosinophils Absolute: 0.2 10*3/uL (ref 0.0–0.5)
HCT: 42 % (ref 34.8–46.6)
HEMOGLOBIN: 14.3 g/dL (ref 11.6–15.9)
LYMPH%: 22.1 % (ref 14.0–49.7)
MCH: 31.2 pg (ref 25.1–34.0)
MCHC: 34 g/dL (ref 31.5–36.0)
MCV: 91.5 fL (ref 79.5–101.0)
MONO#: 0.7 10*3/uL (ref 0.1–0.9)
MONO%: 10 % (ref 0.0–14.0)
NEUT#: 4.6 10*3/uL (ref 1.5–6.5)
NEUT%: 64.2 % (ref 38.4–76.8)
Platelets: 207 10*3/uL (ref 145–400)
RBC: 4.59 10*6/uL (ref 3.70–5.45)
RDW: 14.2 % (ref 11.2–14.5)
WBC: 7.2 10*3/uL (ref 3.9–10.3)
lymph#: 1.6 10*3/uL (ref 0.9–3.3)

## 2017-03-21 LAB — LACTATE DEHYDROGENASE: LDH: 199 U/L (ref 125–245)

## 2017-03-21 NOTE — Telephone Encounter (Signed)
Scheduled appt per 10/26 los. Patient did not want avs or calendar.

## 2017-03-26 MED FILL — VALSARTAN 160 MG TABS: 160 | 30 days supply | Qty: 30 | Fill #4

## 2017-03-26 MED FILL — SYNTHROID 150 MCG TABLET: 150 | 90 days supply | Qty: 90 | Fill #0

## 2017-03-27 MED FILL — VENTOLIN HFA 90 MCG INHALER: 108 (90 BAS | 25 days supply | Qty: 18 | Fill #0

## 2017-04-02 ENCOUNTER — Other Ambulatory Visit: Payer: Self-pay

## 2017-04-02 ENCOUNTER — Ambulatory Visit (HOSPITAL_COMMUNITY): Payer: 59 | Attending: Cardiovascular Disease

## 2017-04-02 DIAGNOSIS — I059 Rheumatic mitral valve disease, unspecified: Secondary | ICD-10-CM | POA: Insufficient documentation

## 2017-04-02 DIAGNOSIS — I509 Heart failure, unspecified: Secondary | ICD-10-CM | POA: Diagnosis not present

## 2017-04-02 DIAGNOSIS — I427 Cardiomyopathy due to drug and external agent: Secondary | ICD-10-CM | POA: Diagnosis not present

## 2017-04-02 DIAGNOSIS — I420 Dilated cardiomyopathy: Secondary | ICD-10-CM | POA: Insufficient documentation

## 2017-04-02 DIAGNOSIS — R011 Cardiac murmur, unspecified: Secondary | ICD-10-CM | POA: Insufficient documentation

## 2017-04-02 DIAGNOSIS — J45909 Unspecified asthma, uncomplicated: Secondary | ICD-10-CM | POA: Diagnosis not present

## 2017-04-02 DIAGNOSIS — C8510 Unspecified B-cell lymphoma, unspecified site: Secondary | ICD-10-CM | POA: Diagnosis not present

## 2017-04-02 MED ORDER — PERFLUTREN LIPID MICROSPHERE
1.0000 mL | INTRAVENOUS | Status: AC | PRN
  Administered 2017-04-02: 2 mL via INTRAVENOUS

## 2017-04-07 ENCOUNTER — Encounter: Payer: Self-pay | Admitting: *Deleted

## 2017-04-09 DIAGNOSIS — Z01419 Encounter for gynecological examination (general) (routine) without abnormal findings: Secondary | ICD-10-CM | POA: Diagnosis not present

## 2017-04-09 DIAGNOSIS — Z6831 Body mass index (BMI) 31.0-31.9, adult: Secondary | ICD-10-CM | POA: Diagnosis not present

## 2017-04-09 MED FILL — CLOBETASOL PROPIONATE 0.05: 0.05 | 30 days supply | Qty: 15 | Fill #0

## 2017-04-30 MED FILL — VALSARTAN 160 MG TABLET: 160 | 30 days supply | Qty: 30 | Fill #0

## 2017-05-05 ENCOUNTER — Ambulatory Visit: Payer: 59 | Admitting: Pulmonary Disease

## 2017-05-06 ENCOUNTER — Ambulatory Visit: Payer: 59 | Admitting: Pulmonary Disease

## 2017-05-07 ENCOUNTER — Ambulatory Visit (INDEPENDENT_AMBULATORY_CARE_PROVIDER_SITE_OTHER): Payer: 59 | Admitting: Pulmonary Disease

## 2017-05-07 ENCOUNTER — Encounter: Payer: Self-pay | Admitting: Pulmonary Disease

## 2017-05-07 VITALS — BP 108/70 | HR 83 | Ht 65.0 in | Wt 199.8 lb

## 2017-05-07 DIAGNOSIS — G4733 Obstructive sleep apnea (adult) (pediatric): Secondary | ICD-10-CM

## 2017-05-07 DIAGNOSIS — Z9989 Dependence on other enabling machines and devices: Secondary | ICD-10-CM | POA: Diagnosis not present

## 2017-05-07 NOTE — Patient Instructions (Signed)
Can look up CPAP mask options on line  Follow up in 1 year 

## 2017-05-07 NOTE — Progress Notes (Signed)
[facility]  Chief Complaint  Patient presents with  . Follow-up    CPAP doing well with sleep    History of Present Illness: Ashley Mckee is a 51 y.o. female with obstructive sleep apnea.  Since her last visit she started keto diet, and has lost 45 lbs!  She is feeling much better, and breathing improved.  She tried sleeping w/o CPAP, but had trouble.  She sleeps better with CPAP.  She has irritation around her nasal bridge from mask and uses a band-aide.  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  Vital Signs: BP 108/70 (BP Location: Left Arm, Cuff Size: Normal)   Pulse 83   Ht 5\' 5"  (1.651 m)   Wt 199 lb 12.8 oz (90.6 kg)   SpO2 98%   BMI 33.25 kg/m   Physical Exam:  General - pleasant Eyes - pupils reactive ENT - no sinus tenderness, no oral exudate, no LAN Cardiac - regular, no murmur Chest - no wheeze, rales Abd - soft, non tender Ext - no edema Skin - no rashes Neuro - normal strength Psych - normal mood  Discussion: She is compliant with CPAP and reports benefit.  She needs to get her mask adjusted.  She has significant weight loss, and might get to a point that we can reassess her sleep apnea.   Assessment/Plan:  Obstructive sleep apnea. - continue auto CPAP  - she will look up CPAP mask options on line - encouraged her to keep up her weight loss efforts - could consider doing a repeat home sleep study to assess status of sleep apnea once her weight loss has hit a plateau    Patient Instructions  Can look up CPAP mask options on line  Follow up in 1  year    Chesley Mires, MD [place] 05/07/2017, 12:43 PM Pager:  [phone]    Pulmonary tests: CT angio chest 03/20/15 >> post XRT changes LUL/LLL and RUL, calcified granulomas RUL/LUL/LLL PFT 06/02/15 >> FEV1 1.33 (43%), FEV1% 74, TLC 3.24 (60%), DLCO 71%, +BD  Sleep tests: HST 03/12/16 >> AHI 33.7, SaO2 low 77% Auto CPAP 04/07/17 to 05/06/17 >> used on 30 of 30 nights with average 6 hrs 53 min.  Average AHI 0.1 with median CPAP 12 and 95 th percentile CPAP 14 cm H2O  Cardiac tests: Echo 04/02/17 >> EF 55%  Events:   Past Surgical History: She  has a past surgical history that includes Tubal ligation; Lymph node biopsy; Bone marrow transplant; Hysterotomy (2014); and Dilatation & curettage/hysteroscopy with myosure (N/A, 06/20/2016).  Family History: Her family history includes Colon cancer in her maternal grandmother; Diabetes in her father; Hypertension in her father and mother; Stomach cancer in her other.  Social History: She  reports that she quit smoking about 17 years ago. Her smoking use included cigarettes. She has a 25.50 pack-year smoking history. she has never used smokeless tobacco. She reports that she drinks alcohol. She reports that she does not use drugs.  Medications: Allergies as of 05/07/2017      Reactions   Ace Inhibitors Cough      Medication List        Accurate as of 05/07/17 12:43  PM. Always use your most recent med list.          Albuterol Sulfate 108 (90 Base) MCG/ACT Aepb Inhale into the lungs.   clobetasol cream 0.05 % Commonly known as:  TEMOVATE Apply 1 application topically as needed.   SYNTHROID 150 MCG tablet Generic drug:  levothyroxine Take 1 tablet (150 mcg total) by mouth daily.   valsartan 160 MG tablet Commonly known as:  DIOVAN Take 1 tablet (160 mg total) by mouth daily.

## 2017-05-12 ENCOUNTER — Encounter: Payer: Self-pay | Admitting: Family Medicine

## 2017-05-13 ENCOUNTER — Other Ambulatory Visit: Payer: Self-pay | Admitting: Family Medicine

## 2017-05-13 DIAGNOSIS — J4520 Mild intermittent asthma, uncomplicated: Secondary | ICD-10-CM

## 2017-05-13 MED ORDER — MOMETASONE FURO-FORMOTEROL FUM 100-5 MCG/ACT IN AERO: 2.0000 | INHALATION_SPRAY | Freq: Two times a day (BID) | RESPIRATORY_TRACT | Status: DC

## 2017-05-13 MED FILL — CLOBETASOL PROPIONATE 0.05: 0.05 | 30 days supply | Qty: 15 | Fill #1

## 2017-05-14 ENCOUNTER — Other Ambulatory Visit: Payer: Self-pay

## 2017-05-14 MED ORDER — MOMETASONE FURO-FORMOTEROL FUM 100-5 MCG/ACT IN AERO: 2.0000 | INHALATION_SPRAY | Freq: Two times a day (BID) | RESPIRATORY_TRACT | 2 refills | Status: DC

## 2017-05-14 MED FILL — DULERA 100 MCG/5 MCG INH: 100-5 | 30 days supply | Qty: 13 | Fill #0

## 2017-05-23 DIAGNOSIS — G4733 Obstructive sleep apnea (adult) (pediatric): Secondary | ICD-10-CM | POA: Diagnosis not present

## 2017-06-04 MED FILL — VALSARTAN 160 MG TABLET: 160 | 30 days supply | Qty: 30 | Fill #1

## 2017-07-09 MED FILL — VALSARTAN 160 MG TABLET: 160 | 30 days supply | Qty: 30 | Fill #2

## 2017-07-09 MED FILL — SYNTHROID 150 MCG TABLET: 150 | 90 days supply | Qty: 90 | Fill #1

## 2017-07-24 MED FILL — CLOBETASOL PROPIONATE 0.05: 0.05 | 30 days supply | Qty: 15 | Fill #2

## 2017-08-14 MED FILL — VALSARTAN 160 MG TABLET: 160 | 30 days supply | Qty: 30 | Fill #3

## 2017-08-26 DIAGNOSIS — G4733 Obstructive sleep apnea (adult) (pediatric): Secondary | ICD-10-CM | POA: Diagnosis not present

## 2017-08-29 MED FILL — CLOBETASOL PROPIONATE 0.05: 0.05 | 30 days supply | Qty: 15 | Fill #3

## 2017-09-22 MED FILL — VALSARTAN 160 MG TABLET: 160 | 30 days supply | Qty: 30 | Fill #4

## 2017-10-10 MED FILL — CLOBETASOL PROPIONATE 0.05: 0.05 | 30 days supply | Qty: 15 | Fill #4

## 2017-10-21 MED FILL — VALSARTAN 160 MG TABLET: 160 | 30 days supply | Qty: 30 | Fill #5

## 2017-10-21 MED FILL — SYNTHROID 150 MCG TABLET: 150 | 90 days supply | Qty: 90 | Fill #2

## 2017-12-02 MED FILL — VALSARTAN 160 MG TABS: 160 | 30 days supply | Qty: 30 | Fill #6

## 2017-12-02 MED FILL — CLOBETASOL PROPIONATE 0.05: 0.05 | 30 days supply | Qty: 15 | Fill #5

## 2017-12-05 DIAGNOSIS — G4733 Obstructive sleep apnea (adult) (pediatric): Secondary | ICD-10-CM | POA: Diagnosis not present

## 2017-12-10 DIAGNOSIS — H5213 Myopia, bilateral: Secondary | ICD-10-CM | POA: Diagnosis not present

## 2017-12-10 DIAGNOSIS — H524 Presbyopia: Secondary | ICD-10-CM | POA: Diagnosis not present

## 2017-12-10 DIAGNOSIS — H52223 Regular astigmatism, bilateral: Secondary | ICD-10-CM | POA: Diagnosis not present

## 2018-01-02 MED FILL — VALSARTAN 160 MG TABLET: 160 | 30 days supply | Qty: 30 | Fill #7

## 2018-01-02 MED FILL — CLOBETASOL PROPIONATE 0.05: 0.05 | 30 days supply | Qty: 15 | Fill #6

## 2018-01-29 ENCOUNTER — Encounter: Payer: Self-pay | Admitting: Family Medicine

## 2018-01-29 ENCOUNTER — Ambulatory Visit (INDEPENDENT_AMBULATORY_CARE_PROVIDER_SITE_OTHER): Payer: 59 | Admitting: Family Medicine

## 2018-01-29 VITALS — BP 117/45 | HR 68 | Temp 98.0°F | Resp 16 | Ht 65.0 in | Wt 209.6 lb

## 2018-01-29 DIAGNOSIS — I427 Cardiomyopathy due to drug and external agent: Secondary | ICD-10-CM | POA: Diagnosis not present

## 2018-01-29 DIAGNOSIS — Z23 Encounter for immunization: Secondary | ICD-10-CM

## 2018-01-29 DIAGNOSIS — I509 Heart failure, unspecified: Secondary | ICD-10-CM

## 2018-01-29 DIAGNOSIS — Z0000 Encounter for general adult medical examination without abnormal findings: Secondary | ICD-10-CM | POA: Diagnosis not present

## 2018-01-29 DIAGNOSIS — E039 Hypothyroidism, unspecified: Secondary | ICD-10-CM

## 2018-01-29 DIAGNOSIS — J4520 Mild intermittent asthma, uncomplicated: Secondary | ICD-10-CM | POA: Diagnosis not present

## 2018-01-29 MED ORDER — ALBUTEROL SULFATE 108 (90 BASE) MCG/ACT IN AEPB
2.0000 | INHALATION_SPRAY | Freq: Four times a day (QID) | RESPIRATORY_TRACT | 2 refills | Status: DC
Start: 2018-01-29 — End: 2019-05-11

## 2018-01-29 MED ORDER — VALSARTAN 160 MG PO TABS: 160.0000 mg | ORAL_TABLET | Freq: Every day | ORAL | 3 refills | Status: DC

## 2018-01-29 MED FILL — VENTOLIN HFA 90 MCG INHALER: 108 (90 BAS | 25 days supply | Qty: 18 | Fill #0

## 2018-01-29 MED FILL — VALSARTAN 160 MG TABS: 160 | 90 days supply | Qty: 90 | Fill #0

## 2018-01-29 NOTE — Progress Notes (Signed)
Subjective:     Ashley Mckee is a 52 y.o. female and is here for a comprehensive physical exam. The patient reports no problems. She needs refills on her meds -- no complaints.   Social History   Socioeconomic History  . Marital status: Married    Spouse name: DAVID   . Number of children: 3  . Years of education: 63  . Highest education level: Not on file  Occupational History  . 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 Financial resource strain: Not on file  . Food insecurity:    Worry: Not on file    Inability: Not on file  . Transportation needs:    Medical: Not on file    Non-medical: Not on file  Tobacco Use  . Smoking status: Former Smoker    Packs/day: 1.50    Years: 17.00    Pack years: 25.50    Types: Cigarettes    Last attempt to quit: 05/28/1999    Years since quitting: 18.6  . Smokeless tobacco: Never Used  Substance and Sexual Activity  . Alcohol use: Yes    Comment: Rarely   . Drug use: No  . Sexual activity: Yes    Partners: Male    Birth control/protection: Post-menopausal  Lifestyle  . Physical activity:    Days per week: Not on file    Minutes per session: Not on file  . Stress: Not on file  Relationships  . Social connections:    Talks on phone: Not on file    Gets together: Not on file    Attends religious service: Not on file    Active member of club or organization: Not on file    Attends meetings of clubs or organizations: Not on file    Relationship status: Not on file  . Intimate partner violence:    Fear of current or ex partner: Not on file    Emotionally abused: Not on file    Physically abused: Not on file    Forced sexual activity: Not on file  Other Topics Concern  . Not on file  Social History Narrative   Marital Status:  Married [name])   Children:  Daughter (3)    Pets: Dog (2) Cat (1)    Living Situation: Lives with husband and kids    Occupation: Risk manager)      Education: Environmental education officer    Tobacco Use/Exposure:  Former Smoker    Alcohol Use:  Rarely    Drug Use:  None   Diet:  Regular   Exercise:  None   Hobbies:  Walking             Health Maintenance  Topic Date Due  . MAMMOGRAM  04/11/2017  . INFLUENZA VACCINE  12/25/2017  . PAP SMEAR  01/18/2018  . COLONOSCOPY  07/26/2021  . TETANUS/TDAP  07/25/2024  . 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 She does not have any pertinent problems on file. She  has a past surgical history that includes Tubal ligation; Lymph node biopsy; Bone marrow transplant; Hysterotomy (  2014); and Dilatation & curettage/hysteroscopy with myosure (N/A, 06/20/2016). Her family history includes Colon cancer in her maternal grandmother; Diabetes in her father; Hypertension in her father and mother; Stomach cancer in her other. She  reports that she quit smoking about 18 years ago. Her smoking use included cigarettes. She has a 25.50 pack-year smoking history. She has never used smokeless tobacco. She reports that she drinks alcohol. She reports that she does not use drugs. She has a current medication list which includes the following prescription(s): albuterol sulfate, clobetasol cream, synthroid, and valsartan, and the following Facility-Administered Medications: sodium chloride and mometasone-formoterol. Current Outpatient Medications on File Prior to Visit  Medication Sig Dispense Refill  . clobetasol cream (TEMOVATE) 7.03 % Apply 1 application topically as needed.   6  . SYNTHROID 150 MCG tablet Take 1 tablet (150 mcg total) by mouth daily. 90 tablet 3  . valsartan (DIOVAN) 160 MG tablet Take 1 tablet (160 mg total) by mouth daily. 90 tablet 3   Current Facility-Administered Medications on File Prior to Visit  Medication Dose Route Frequency Provider Last Rate Last Dose  . 0.9 %  sodium chloride infusion  500 mL Intravenous Continuous Armbruster, Carlota Raspberry, MD      . mometasone-formoterol (DULERA) 100-5 MCG/ACT inhaler 2 puff  2 puff Inhalation BID Roma Schanz R, DO       She is allergic to ace inhibitors..  Review of Systems Review of Systems  Constitutional: Negative for activity change, appetite change and fatigue.  HENT: Negative for hearing loss, congestion, tinnitus and ear discharge.  dentist q66m Eyes: Negative for visual disturbance (see optho q1y -- vision corrected to 20/20 with glasses).  Respiratory: Negative for cough, chest tightness and shortness of breath.   Cardiovascular: Negative for chest pain, palpitations and leg swelling.  Gastrointestinal: Negative for abdominal pain, diarrhea, constipation and abdominal distention.  Genitourinary: Negative for urgency, frequency, decreased urine volume and difficulty urinating.  Musculoskeletal: Negative for back pain, arthralgias and gait problem.  Skin: Negative for color change, pallor and rash.  Neurological: Negative for dizziness, light-headedness, numbness and headaches.  Hematological: Negative for adenopathy. Does not bruise/bleed easily.  Psychiatric/Behavioral: Negative for suicidal ideas, confusion, sleep disturbance, self-injury, dysphoric mood, decreased concentration and agitation.       Objective:    BP (!) 117/45 (BP Location: Right Arm, Cuff Size: Large)   Pulse 68   Temp 98 F (36.7 C) (Oral)   Resp 16   Ht 5\' 5"  (1.651 m)   Wt 209 lb 9.6 oz (95.1 kg)   SpO2 99%   BMI 34.88 kg/m  General appearance: alert, cooperative, appears stated age and no  distress Head: Normocephalic, without obvious abnormality, atraumatic Eyes: conjunctivae/corneas clear. PERRL, EOM's intact. Fundi benign. Ears: normal TM's and external ear canals both ears Nose: Nares normal. Septum midline. Mucosa normal. No drainage or sinus tenderness. Throat: lips, mucosa, and tongue normal; teeth and gums normal Neck: no adenopathy, no carotid bruit, no JVD, supple, symmetrical, trachea midline and thyroid not enlarged, symmetric, no tenderness/mass/nodules Back: symmetric, no curvature. ROM normal. No CVA tenderness. Lungs: clear to auscultation bilaterally Breasts: gyn Heart: regular rate and rhythm, S1, S2 normal, no murmur, click, rub or gallop Abdomen: soft, non-tender; bowel sounds normal; no masses,  no organomegaly Pelvic: deferred--gyn Extremities: extremities normal, atraumatic, no cyanosis or edema Pulses: 2+ and symmetric Skin: Skin color, texture, turgor normal. No rashes or lesions Lymph nodes: Cervical, supraclavicular, and axillary nodes normal. Neurologic: Alert and oriented  X 3, normal strength and tone. Normal symmetric reflexes. Normal coordination and gait    Assessment:    Healthy female exam.      Plan:    gyn utd Check labs See After Visit Summary for Counseling Recommendations    1. Mild intermittent asthma, unspecified whether complicated Stable  - Albuterol Sulfate 108 (90 Base) MCG/ACT AEPB; Inhale 2 puffs into the lungs 4 (four) times daily.  Dispense: 1 each; Refill: 2  2. Hypothyroidism, unspecified type Check labs con't synthroid  - TSH  3. Preventative health care See above  - CBC with Differential/Platelet - Lipid panel - Comprehensive metabolic panel - TSH  4. Morbid obesity ([place])   - Amb Ref to Medical Weight Management  5. 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  Dispense: 90 tablet; Refill:  3  6. Congestive heart failure, unspecified HF chronicity, unspecified heart failure type (HCC) Stable  - valsartan (DIOVAN) 160 MG tablet; Take 1 tablet (160 mg total) by mouth daily.  Dispense: 90 tablet; Refill: 3

## 2018-01-29 NOTE — Addendum Note (Signed)
Addended by: Kem Boroughs D on: 01/29/2018 04:51 PM   Modules accepted: Orders

## 2018-01-29 NOTE — Patient Instructions (Signed)
Preventive Care 40-64 Years, Female Preventive care refers to lifestyle choices and visits with your health care provider that can promote health and wellness. What does preventive care include?  A yearly physical exam. This is also called an annual well check.  Dental exams once or twice a year.  Routine eye exams. Ask your health care provider how often you should have your eyes checked.  Personal lifestyle choices, including: ? Daily care of your teeth and gums. ? Regular physical activity. ? Eating a healthy diet. ? Avoiding tobacco and drug use. ? Limiting alcohol use. ? Practicing safe sex. ? Taking low-dose aspirin daily starting at age 58. ? Taking vitamin and mineral supplements as recommended by your health care provider. What happens during an annual well check? The services and screenings done by your health care provider during your annual well check will depend on your age, overall health, lifestyle risk factors, and family history of disease. Counseling Your health care provider may ask you questions about your:  Alcohol use.  Tobacco use.  Drug use.  Emotional well-being.  Home and relationship well-being.  Sexual activity.  Eating habits.  Work and work Statistician.  Method of birth control.  Menstrual cycle.  Pregnancy history.  Screening You may have the following tests or measurements:  Height, weight, and BMI.  Blood pressure.  Lipid and cholesterol levels. These may be checked every 5 years, or more frequently if you are over 81 years old.  Skin check.  Lung cancer screening. You may have this screening every year starting at age 78 if you have a 30-pack-year history of smoking and currently smoke or have quit within the past 15 years.  Fecal occult blood test (FOBT) of the stool. You may have this test every year starting at age 65.  Flexible sigmoidoscopy or colonoscopy. You may have a sigmoidoscopy every 5 years or a colonoscopy  every 10 years starting at age 30.  Hepatitis C blood test.  Hepatitis B blood test.  Sexually transmitted disease (STD) testing.  Diabetes screening. This is done by checking your blood sugar (glucose) after you have not eaten for a while (fasting). You may have this done every 1-3 years.  Mammogram. This may be done every 1-2 years. Talk to your health care provider about when you should start having regular mammograms. This may depend on whether you have a family history of breast cancer.  BRCA-related cancer screening. This may be done if you have a family history of breast, ovarian, tubal, or peritoneal cancers.  Pelvic exam and Pap test. This may be done every 3 years starting at age 80. Starting at age 36, this may be done every 5 years if you have a Pap test in combination with an HPV test.  Bone density scan. This is done to screen for osteoporosis. You may have this scan if you are at high risk for osteoporosis.  Discuss your test results, treatment options, and if necessary, the need for more tests with your health care provider. Vaccines Your health care provider may recommend certain vaccines, such as:  Influenza vaccine. This is recommended every year.  Tetanus, diphtheria, and acellular pertussis (Tdap, Td) vaccine. You may need a Td booster every 10 years.  Varicella vaccine. You may need this if you have not been vaccinated.  Zoster vaccine. You may need this after age 5.  Measles, mumps, and rubella (MMR) vaccine. You may need at least one dose of MMR if you were born in  1955 or later. You may also need a second dose.  Pneumococcal 13-valent conjugate (PCV13) vaccine. You may need this if you have certain conditions and were not previously vaccinated.  Pneumococcal polysaccharide (PPSV23) vaccine. You may need one or two doses if you smoke cigarettes or if you have certain conditions.  Meningococcal vaccine. You may need this if you have certain  conditions.  Hepatitis A vaccine. You may need this if you have certain conditions or if you travel or work in places where you may be exposed to hepatitis A.  Hepatitis B vaccine. You may need this if you have certain conditions or if you travel or work in places where you may be exposed to hepatitis B.  Haemophilus influenzae type b (Hib) vaccine. You may need this if you have certain conditions.  Talk to your health care provider about which screenings and vaccines you need and how often you need them. This information is not intended to replace advice given to you by your health care provider. Make sure you discuss any questions you have with your health care provider. Document Released: 06/09/2015 Document Revised: 01/31/2016 Document Reviewed: 03/14/2015 Elsevier Interactive Patient Education  2018 Elsevier Inc.  

## 2018-01-30 LAB — COMPREHENSIVE METABOLIC PANEL
ALT: 46 U/L — ABNORMAL HIGH (ref 0–35)
AST: 34 U/L (ref 0–37)
Albumin: 4.1 g/dL (ref 3.5–5.2)
Alkaline Phosphatase: 90 U/L (ref 39–117)
BUN: 18 mg/dL (ref 6–23)
CALCIUM: 9.7 mg/dL (ref 8.4–10.5)
CO2: 34 meq/L — AB (ref 19–32)
CREATININE: 0.85 mg/dL (ref 0.40–1.20)
Chloride: 102 mEq/L (ref 96–112)
GFR: 75.13 mL/min (ref >60.00)
Glucose, Bld: 75 mg/dL (ref 70–99)
Potassium: 4.9 mEq/L (ref 3.5–5.1)
Sodium: 142 mEq/L (ref 135–145)
TOTAL PROTEIN: 6.8 g/dL (ref 6.0–8.3)
Total Bilirubin: 0.5 mg/dL (ref 0.2–1.2)

## 2018-01-30 LAB — LIPID PANEL
CHOL/HDL RATIO: 3
Cholesterol: 179 mg/dL (ref 0–200)
HDL: 65.7 mg/dL (ref >39.00)
LDL Cholesterol: 96 mg/dL (ref 0–99)
NonHDL: 112.99
TRIGLYCERIDES: 83 mg/dL (ref 0.0–149.0)
VLDL: 16.6 mg/dL (ref 0.0–40.0)

## 2018-01-30 LAB — CBC WITH DIFFERENTIAL/PLATELET
BASOS ABS: 0.1 10*3/uL (ref 0.0–0.1)
BASOS PCT: 1 % (ref 0.0–3.0)
EOS ABS: 0.2 10*3/uL (ref 0.0–0.7)
Eosinophils Relative: 2.1 % (ref 0.0–5.0)
HEMATOCRIT: 39.4 % (ref 36.0–46.0)
Hemoglobin: 13.3 g/dL (ref 12.0–15.0)
Lymphocytes Relative: 28.1 % (ref 12.0–46.0)
Lymphs Abs: 2.2 10*3/uL (ref 0.7–4.0)
MCHC: 33.8 g/dL (ref 30.0–36.0)
MCV: 93 fl (ref 78.0–100.0)
Monocytes Absolute: 0.7 10*3/uL (ref 0.1–1.0)
Monocytes Relative: 8.5 % (ref 3.0–12.0)
NEUTROS ABS: 4.7 10*3/uL (ref 1.4–7.7)
NEUTROS PCT: 60.3 % (ref 43.0–77.0)
Platelets: 217 10*3/uL (ref 150.0–400.0)
RBC: 4.23 Mil/uL (ref 3.87–5.11)
RDW: 13.9 % (ref 11.5–15.5)
WBC: 7.8 10*3/uL (ref 4.0–10.5)

## 2018-01-30 LAB — TSH: TSH: 4.15 u[IU]/mL (ref 0.35–4.50)

## 2018-02-05 MED FILL — SYNTHROID 150 MCG TABLET: 150 | 90 days supply | Qty: 90 | Fill #3

## 2018-03-12 DIAGNOSIS — G4733 Obstructive sleep apnea (adult) (pediatric): Secondary | ICD-10-CM | POA: Diagnosis not present

## 2018-03-23 ENCOUNTER — Inpatient Hospital Stay: Payer: 59

## 2018-03-23 ENCOUNTER — Encounter: Payer: Self-pay | Admitting: Adult Health

## 2018-03-23 ENCOUNTER — Telehealth: Payer: Self-pay | Admitting: Adult Health

## 2018-03-23 ENCOUNTER — Inpatient Hospital Stay: Payer: 59 | Attending: Adult Health | Admitting: Adult Health

## 2018-03-23 VITALS — BP 97/37 | HR 84 | Temp 98.2°F | Resp 18 | Ht 65.0 in | Wt 203.5 lb

## 2018-03-23 DIAGNOSIS — Z8572 Personal history of non-Hodgkin lymphomas: Secondary | ICD-10-CM | POA: Insufficient documentation

## 2018-03-23 DIAGNOSIS — C8590 Non-Hodgkin lymphoma, unspecified, unspecified site: Secondary | ICD-10-CM

## 2018-03-23 LAB — COMPREHENSIVE METABOLIC PANEL
ALT: 27 U/L (ref 0–44)
AST: 22 U/L (ref 15–41)
Albumin: 3.8 g/dL (ref 3.5–5.0)
Alkaline Phosphatase: 89 U/L (ref 38–126)
Anion gap: 9 (ref 5–15)
BUN: 20 mg/dL (ref 6–20)
CALCIUM: 9.7 mg/dL (ref 8.9–10.3)
CO2: 27 mmol/L (ref 22–32)
Chloride: 104 mmol/L (ref 98–111)
Creatinine, Ser: 0.98 mg/dL (ref 0.44–1.00)
GFR calc Af Amer: >60 mL/min (ref >60)
GFR calc non Af Amer: >60 mL/min (ref >60)
Glucose, Bld: 98 mg/dL (ref 70–99)
Potassium: 4.8 mmol/L (ref 3.5–5.1)
Sodium: 140 mmol/L (ref 135–145)
TOTAL PROTEIN: 7.3 g/dL (ref 6.5–8.1)
Total Bilirubin: 0.7 mg/dL (ref 0.3–1.2)

## 2018-03-23 LAB — CBC WITH DIFFERENTIAL/PLATELET
Abs Immature Granulocytes: 0.03 10*3/uL (ref 0.00–0.07)
BASOS PCT: 1 %
Basophils Absolute: 0 10*3/uL (ref 0.0–0.1)
EOS ABS: 0.1 10*3/uL (ref 0.0–0.5)
Eosinophils Relative: 3 %
HCT: 42.8 % (ref 36.0–46.0)
Hemoglobin: 14 g/dL (ref 12.0–15.0)
Immature Granulocytes: 1 %
Lymphocytes Relative: 30 %
Lymphs Abs: 1.5 10*3/uL (ref 0.7–4.0)
MCH: 30.6 pg (ref 26.0–34.0)
MCHC: 32.7 g/dL (ref 30.0–36.0)
MCV: 93.7 fL (ref 80.0–100.0)
MONO ABS: 0.6 10*3/uL (ref 0.1–1.0)
Monocytes Relative: 11 %
NEUTROS ABS: 2.8 10*3/uL (ref 1.7–7.7)
Neutrophils Relative %: 54 %
PLATELETS: 217 10*3/uL (ref 150–400)
RBC: 4.57 MIL/uL (ref 3.87–5.11)
RDW: 13.4 % (ref 11.5–15.5)
WBC: 5.1 10*3/uL (ref 4.0–10.5)
nRBC: 0 % (ref 0.0–0.2)

## 2018-03-23 LAB — LACTATE DEHYDROGENASE: LDH: 198 U/L — AB (ref 98–192)

## 2018-03-23 NOTE — Telephone Encounter (Signed)
No 10/28 los orders.

## 2018-03-23 NOTE — Patient Instructions (Signed)
Bone Health Bones protect organs, store calcium, and anchor muscles. Good health habits, such as eating nutritious foods and exercising regularly, are important for maintaining healthy bones. They can also help to prevent a condition that causes bones to lose density and become weak and brittle (osteoporosis). Why is bone mass important? Bone mass refers to the amount of bone tissue that you have. The higher your bone mass, the stronger your bones. An important step toward having healthy bones throughout life is to have strong and dense bones during childhood. A young adult who has a high bone mass is more likely to have a high bone mass later in life. Bone mass at its greatest it is called peak bone mass. A large decline in bone mass occurs in older adults. In women, it occurs about the time of menopause. During this time, it is important to practice good health habits, because if more bone is lost than what is replaced, the bones will become less healthy and more likely to break (fracture). If you find that you have a low bone mass, you may be able to prevent osteoporosis or further bone loss by changing your diet and lifestyle. How can I find out if my bone mass is low? Bone mass can be measured with an X-ray test that is called a bone mineral density (BMD) test. This test is recommended for all women who are age 65 or older. It may also be recommended for men who are age 70 or older, or for people who are more likely to develop osteoporosis due to:  Having bones that break easily.  Having a long-term disease that weakens bones, such as kidney disease or rheumatoid arthritis.  Having menopause earlier than normal.  Taking medicine that weakens bones, such as steroids, thyroid hormones, or hormone treatment for breast cancer or prostate cancer.  Smoking.  Drinking three or more alcoholic drinks each day.  What are the nutritional recommendations for healthy bones? To have healthy bones, you  need to get enough of the right minerals and vitamins. Most nutrition experts recommend getting these nutrients from the foods that you eat. Nutritional recommendations vary from person to person. Ask your health care provider what is healthy for you. Here are some general guidelines. Calcium Recommendations Calcium is the most important (essential) mineral for bone health. Most people can get enough calcium from their diet, but supplements may be recommended for people who are at risk for osteoporosis. Good sources of calcium include:  Dairy products, such as low-fat or nonfat milk, cheese, and yogurt.  Dark green leafy vegetables, such as bok choy and broccoli.  Calcium-fortified foods, such as orange juice, cereal, bread, soy beverages, and tofu products.  Nuts, such as almonds.  Follow these recommended amounts for daily calcium intake:  Children, age 1?3: 700 mg.  Children, age 4?8: 1,000 mg.  Children, age 9?13: 1,300 mg.  Teens, age 14?18: 1,300 mg.  Adults, age 19?50: 1,000 mg.  Adults, age 51?70: ? Men: 1,000 mg. ? Women: 1,200 mg.  Adults, age 71 or older: 1,200 mg.  Pregnant and breastfeeding females: ? Teens: 1,300 mg. ? Adults: 1,000 mg.  Vitamin D Recommendations Vitamin D is the most essential vitamin for bone health. It helps the body to absorb calcium. Sunlight stimulates the skin to make vitamin D, so be sure to get enough sunlight. If you live in a cold climate or you do not get outside often, your health care provider may recommend that you take vitamin   D supplements. Good sources of vitamin D in your diet include:  Egg yolks.  Saltwater fish.  Milk and cereal fortified with vitamin D.  Follow these recommended amounts for daily vitamin D intake:  Children and teens, age 1?18: 600 international units.  Adults, age 50 or younger: 400-800 international units.  Adults, age 51 or older: 800-1,000 international units.  Other Nutrients Other nutrients  for bone health include:  Phosphorus. This mineral is found in meat, poultry, dairy foods, nuts, and legumes. The recommended daily intake for adult men and adult women is 700 mg.  Magnesium. This mineral is found in seeds, nuts, dark green vegetables, and legumes. The recommended daily intake for adult men is 400?420 mg. For adult women, it is 310?320 mg.  Vitamin K. This vitamin is found in green leafy vegetables. The recommended daily intake is 120 mg for adult men and 90 mg for adult women.  What type of physical activity is best for building and maintaining healthy bones? Weight-bearing and strength-building activities are important for building and maintaining peak bone mass. Weight-bearing activities cause muscles and bones to work against gravity. Strength-building activities increases muscle strength that supports bones. Weight-bearing and muscle-building activities include:  Walking and hiking.  Jogging and running.  Dancing.  Gym exercises.  Lifting weights.  Tennis and racquetball.  Climbing stairs.  Aerobics.  Adults should get at least 30 minutes of moderate physical activity on most days. Children should get at least 60 minutes of moderate physical activity on most days. Ask your health care provide what type of exercise is best for you. Where can I find more information? For more information, check out the following websites:  National Osteoporosis Foundation: http://nof.org/learn/basics  National Institutes of Health: http://www.niams.nih.gov/Health_Info/Bone/Bone_Health/bone_health_for_life.asp  This information is not intended to replace advice given to you by your health care provider. Make sure you discuss any questions you have with your health care provider. Document Released: 08/03/2003 Document Revised: 12/01/2015 Document Reviewed: 05/18/2014 Elsevier Interactive Patient Education  2018 Elsevier Inc.  

## 2018-03-23 NOTE — Progress Notes (Signed)
CLINIC:  Survivorship   REASON FOR VISIT:  Routine follow-up for history of lymphoma.   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    09/29/1998 - 02/23/1999 Chemotherapy    She received 8 cycles of CHOP    02/26/1999 Imaging    repeat imaging showed residual mass measured about 6 cm    03/05/1999 - 05/07/1999 Radiation Therapy    She finished consolidation treatment with radiation    07/31/1999 Imaging    Repeat CT scan showed enlarging mass 7 cm maximum dimension    08/06/1999 Procedure    CT guided biopsy non-diagnostic    08/15/1999 Surgery    She had left thoracotomy and biopsy that confirmed disease relapse    08/27/1999 - 11/25/2014 Chemotherapy    She received 4 cycles of R-ICE chemo with marginal response    12/05/1999 - 01/06/2000 Chemotherapy    She was admitted to receive high dose VP-16 for stem cell mobilization    12/05/1999 Bone Marrow Biopsy    BM biopsy was negative    01/10/2000 Bone Marrow Transplant    she underwent autologous stem cell transplant in [place]:  Ashley Mckee presents to the Survivorship Clinic today for routine follow-up for her history of NHL.  Overall, she reports feeling quite well. She sees her PCP, Dr. Etter Sjogren regularly.  She sees Dr. Radene Knee at [facility].  She is going to undergo her mammogram at that point.  The last one we have on file was in 03/2016 and was normal.  She is up to date with colon cancer screening. She is planning on making an appointment with dermatology.  She is not exercising     REVIEW OF  SYSTEMS:  Review of Systems  Constitutional: Negative for appetite change, chills, diaphoresis, fatigue, fever and unexpected weight change.  HENT:   Negative for hearing loss, lump/mass and trouble swallowing.   Eyes: Negative for eye problems and icterus.  Respiratory: Negative for chest tightness, cough and shortness of breath.   Cardiovascular: Negative for chest pain, leg swelling and palpitations.  Gastrointestinal: Negative for abdominal distention, abdominal pain, constipation, diarrhea, nausea and vomiting.  Endocrine: Negative for hot flashes.  Musculoskeletal: Negative for arthralgias.  Skin: Negative for itching and rash.  Neurological: Negative for dizziness, extremity weakness, headaches and numbness.  Hematological: Negative for adenopathy. Does not bruise/bleed easily.  Psychiatric/Behavioral: Negative for depression. The patient is not nervous/anxious.      PAST MEDICAL/SURGICAL HISTORY:  Past Medical History:  Diagnosis Date  . Asthma   . 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 CHF (congestive heart failure) (H[facility])    Last EF was >50%   . Diaphragm dysfunction    Left Side - She thinks this resulted after her chemotherapy.    . Diaphragm paralysis    left  . Dyspnea   . Heart murmur   . 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 Hypothyroidism   . Non Hodgkin's lymphoma ([place]) 2000   Non Hodgkin's Lymphoma (Bone Marrow Transplant)  - High  [facility]  . OSA (obstructive sleep apnea) 03/13/2016  . Sleep apnea    Uses CPAP  . Thyroid disease    Past Surgical History:  Procedure Laterality Date  . BONE MARROW TRANSPLANT    . DILATATION & CURETTAGE/HYSTEROSCOPY WITH MYOSURE N/A 06/20/2016   Procedure: DILATATION & CURETTAGE/HYSTEROSCOPY WITH MYOSURE;  Surgeon: Arvella Nigh, MD;  Location: [facility];  Service: Gynecology;  Laterality: N/A;  . HYSTEROTOMY  2014  . LYMPH NODE BIOPSY    . TUBAL LIGATION       ALLERGIES:    Allergies  Allergen Reactions  . Ace Inhibitors Cough     CURRENT MEDICATIONS:  Outpatient Encounter Medications as of 03/23/2018  Medication Sig Note  . Albuterol Sulfate 108 (90 Base) MCG/ACT AEPB Inhale 2 puffs into the lungs 4 (four) times daily.   . clobetasol cream (TEMOVATE) 8.58 % Apply 1 application topically as needed. 03/21/2017: Apply to labia  . SYNTHROID 150 MCG tablet Take 1 tablet (150 mcg total) by mouth daily.   . valsartan (DIOVAN) 160 MG tablet Take 1 tablet (160 mg total) by mouth daily.    Facility-Administered Encounter Medications as of 03/23/2018  Medication  . 0.9 %  sodium chloride infusion  . mometasone-formoterol (DULERA) 100-5 MCG/ACT inhaler 2 puff     ONCOLOGIC FAMILY HISTORY:  Family History  Problem Relation Age of Onset  . Hypertension Mother   . Diabetes Father   . Hypertension Father   . Colon cancer Maternal Grandmother   . Stomach cancer Other        maternal great grandmother  . Esophageal cancer Neg Hx   . Rectal cancer Neg Hx   . Liver cancer Neg Hx       SOCIAL HISTORY:  Ashley Mckee is married and lives with her husband in [place], [place].  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  Denies any current or history of tobacco, alcohol, or illicit drug use. (reviewed today, 03/23/2018)   PHYSICAL EXAMINATION:  Vital Signs: Vitals:   03/23/18 0842  BP: (!) 97/37  Pulse: 84  Resp: 18  Temp: 98.2 F (36.8 C)  SpO2: 100%   Filed Weights   03/23/18 0842  Weight: 203 lb 8 oz (92.3 kg)   General: Well-nourished, well-appearing female in no acute distress. Unaccompanied today. HEENT: Head is normocephalic.  Pupils equal and reactive to light. Conjunctivae clear without exudate.  Sclerae anicteric. Oral mucosa is pink, moist.  Oropharynx is pink without lesions or erythema.  Lymph: No cervical, supraclavicular, infraclavicular, or axillary lymphadenopathy noted on palpation.   Cardiovascular: Regular rate and rhythm. Faint systolic murmur unchanged Respiratory: Clear to auscultation bilaterally. Chest expansion symmetric; breathing non-labored.  Breasts: declined as she has GYN appointment next year GI: Abdomen soft and round; non-tender, non-distended. Bowel sounds normoactive. No hepatosplenomegaly.   GU: Deferred.  Neuro: No focal deficits. Steady gait.  Psych: Mood and affect normal and appropriate for situation.  Extremities: No edema. WWP. Skin: Warm and dry.  No rash or erythema noted.     LABORATORY DATA:  Appointment on 03/23/2018  Component Date Value Ref Range Status  . WBC 03/23/2018 5.1  4.0 - 10.5 K/uL Final  . RBC 03/23/2018 4.57  3.87 - 5.11 MIL/uL Final  . Hemoglobin 03/23/2018 14.0  12.0 - 15.0 g/dL Final  . HCT 03/23/2018 42.8  36.0 - 46.0 % Final  . MCV 03/23/2018 93.7  80.0 - 100.0 fL Final  .  MCH 03/23/2018 30.6  26.0 - 34.0 pg Final  . MCHC 03/23/2018 32.7  30.0 - 36.0 g/dL Final  . RDW 03/23/2018 13.4  11.5 - 15.5 % Final  . Platelets 03/23/2018 217  150 - 400 K/uL Final  . nRBC 03/23/2018 0.0  0.0 - 0.2 % Final  . Neutrophils Relative % 03/23/2018 54  % Final  . Neutro Abs 03/23/2018 2.8  1.7 - 7.7 K/uL Final  . Lymphocytes Relative 03/23/2018 30  % Final  . Lymphs Abs 03/23/2018 1.5  0.7 - 4.0 K/uL Final  . Monocytes Relative 03/23/2018 11  % Final  . Monocytes Absolute 03/23/2018 0.6  0.1 - 1.0 K/uL Final  . Eosinophils Relative 03/23/2018 3  % Final  . Eosinophils Absolute 03/23/2018 0.1  0.0 - 0.5 K/uL Final  . Basophils Relative 03/23/2018 1  % Final  . Basophils Absolute 03/23/2018 0.0  0.0 - 0.1 K/uL Final  . Immature Granulocytes 03/23/2018 1  % Final  . Abs Immature Granulocytes 03/23/2018 0.03  0.00 - 0.07 K/uL Final   Performed at [facility], [place] [address] [place], [place] [address]    DIAGNOSTIC IMAGING:  None for this visit     ASSESSMENT AND PLAN:  Ms.. Mckee is a pleasant  52 y.o.female with history of NHL, diagnosed in 09/1998; treated with CHOP, radiation, R-ICE chemo, with marginal response, followed by autologous stem cell transplant in 12/1999. Ashley Mckee presents to the Survivorship Clinic for surveillance and routine follow-up.   1. History of NHL:  Ashley Mckee is currently clinically  without evidence of disease or recurrence of lymphoma.  Labs are normal today and I reviewed those with her in detail, we did not draw TSH today, however this is noted on recent labs from her PCP.  She is following up with her PCP regularly.  She is agreeable to graduation today with continued close f/u with her PCP.  I would recommend that she have an LDH drawn with her labs annually.    2. Cardiomyopathy: ? Treatment related, she is on Valstartan for this and her echo from last year remained stable with an EF of 55%.  3. Cancer screening:  Due to Ashley Mckee's history and age, she should receive screening for skin cancers, breast cancer, colon cancer, and gynecologic cancers.  We reviewed specifically that she is at slight increase for breast cancer due to the fact that she got radiation to her chest are due to her lymphoma.  She did not have this done last year.  We reviewed self breast awareness in detail and the significant importance for her to stay up to date with her mammograms. The patient was encouraged to follow-up with her PCP for appropriate cancer screenings.   4. Health maintenance and wellness promotion: Ashley Mckee was encouraged to consume 5-7 servings of fruits and vegetables per day. The patient was also encouraged to engage in moderate to vigorous exercise for 30 minutes per day most days of the week. Ashley Mckee was instructed to limit her alcohol consumption and continue to abstain from tobacco use.    Dispo:  -Return to cancer center PRN   A total of (30) minutes of face-to-face time was spent with this patient with greater than 50% of that time in counseling and  care-coordination.   Wilber Bihari, NP Survivorship Program [facility] [phone]   Note: PRIMARY CARE PROVIDER Ann Held, [place] [phone]

## 2018-03-27 MED FILL — CLOBETASOL PROPIONATE 0.05: 0.05 | 10 days supply | Qty: 15 | Fill #1

## 2018-04-14 DIAGNOSIS — Z6834 Body mass index (BMI) 34.0-34.9, adult: Secondary | ICD-10-CM | POA: Diagnosis not present

## 2018-04-14 DIAGNOSIS — Z01419 Encounter for gynecological examination (general) (routine) without abnormal findings: Secondary | ICD-10-CM | POA: Diagnosis not present

## 2018-04-14 DIAGNOSIS — Z1231 Encounter for screening mammogram for malignant neoplasm of breast: Secondary | ICD-10-CM | POA: Diagnosis not present

## 2018-04-14 MED FILL — TRIAMCINOLONE 0.1% CREAM: 0.1 | 21 days supply | Qty: 30 | Fill #0

## 2018-04-28 ENCOUNTER — Other Ambulatory Visit: Payer: Self-pay | Admitting: Family

## 2018-04-28 MED ORDER — HYDROCORTISONE 2.5 % EX CREA: TOPICAL_CREAM | Freq: Two times a day (BID) | CUTANEOUS | 0 refills | Status: DC

## 2018-04-28 MED FILL — HYDROCORTISONE 2.5% CREAM: 2.5 | 15 days supply | Qty: 30 | Fill #0

## 2018-05-11 ENCOUNTER — Encounter: Payer: Self-pay | Admitting: Pulmonary Disease

## 2018-05-11 ENCOUNTER — Ambulatory Visit (INDEPENDENT_AMBULATORY_CARE_PROVIDER_SITE_OTHER): Payer: 59 | Admitting: Pulmonary Disease

## 2018-05-11 VITALS — BP 114/72 | HR 87 | Ht 65.0 in | Wt 217.0 lb

## 2018-05-11 DIAGNOSIS — G4733 Obstructive sleep apnea (adult) (pediatric): Secondary | ICD-10-CM

## 2018-05-11 DIAGNOSIS — Z9989 Dependence on other enabling machines and devices: Secondary | ICD-10-CM

## 2018-05-11 DIAGNOSIS — E669 Obesity, unspecified: Secondary | ICD-10-CM | POA: Diagnosis not present

## 2018-05-11 DIAGNOSIS — G4730 Sleep apnea, unspecified: Secondary | ICD-10-CM

## 2018-05-11 NOTE — Progress Notes (Signed)
[name]  Chief Complaint  Patient presents with  . Follow-up    Pt doing well overall with cpap machine.    Constitutional:  BP 114/72 (BP Location: Left Arm, Cuff Size: Normal)   Pulse 87   Ht 5\' 5"  (1.651 m)   Wt 217 lb (98.4 kg)   SpO2 98%   BMI 36.11 kg/m   Past Medical History:  Hypothyroidism, Non Hodgkin's Lymphoma 2000 s/p BMT, B cell lymphoma 2016, Lt diaphragm dysfunction, Asthma  Brief Summary:  Ashley Mckee is a 52 y.o. female with obstructive sleep apnea.  She is doing well with CPAP.  She does get episodes in which she feels the pressure could be higher.  Not having sinus congestion, sore throat, or dry mouth.  Gets mark on her nose from mask, but doesn't cause discomfort.  Physical Exam:   Appearance - well kempt   ENMT - clear nasal mucosa, midline nasal  septum, no oral exudates, no LAN, trachea midline  Respiratory - normal chest wall, normal respiratory effort, no accessory muscle use, no wheeze/rales  CV - s1s2 regular rate and rhythm, no murmurs, no peripheral edema, radial pulses symmetric  GI - soft, non tender, no masses  Lymph - no adenopathy noted in neck and axillary areas  MSK - normal gait  Ext - no cyanosis, clubbing, or joint inflammation noted  Skin - no rashes, lesions, or ulcers  Neuro - normal strength, oriented x 3  Psych - normal mood and affect   Assessment/Plan:   Obstructive sleep apnea. - she is compliant with CPAP and reports benefit - will change to auto CPAP 8 - 17 cm H2O - discussed options to assist with mask fit  Obesity. - discussed importance of weight loss   Patient Instructions  Will change auto CPAP to 8 - 17 cm H2O  Follow up in 1 year    Chesley Mires, MD [place] Pager: [phone] 05/11/2018, 11:27 AM  Flow Sheet     Pulmonary tests:  CT angio chest 03/20/15 >> post XRT changes LUL/LLL and RUL, calcified granulomas  RUL/LUL/LLL PFT 06/02/15 >> FEV1 1.33 (43%), FEV1% 74, TLC 3.24 (60%), DLCO 71%, +BD  Sleep tests:  HST 03/12/16 >> AHI 33.7, SaO2 low 77% Auto CPAP 04/08/18 to 05/07/18 >> used on 29 of 30 nights with average 6 hrs 46 min.  Average AHI 0.1 with median CPAP 13 and 95 th percentile CPAP 15 cm H2O  Cardiac tests:  Echo 04/02/17 >> EF 55%  Medications:   Allergies as of 05/11/2018      Reactions   Ace Inhibitors Cough      Medication List       Accurate as of May 11, 2018 11:27 AM. Always use your most recent med list.        Albuterol Sulfate 108 (90 Base) MCG/ACT Aepb Inhale 2 puffs into the lungs 4 (four) times daily.   clobetasol cream 0.05 % Commonly known as:  TEMOVATE Apply 1 application topically as needed.   SYNTHROID 150 MCG tablet Generic drug:  levothyroxine Take 1 tablet (150 mcg total) by mouth daily.   valsartan 160 MG tablet Commonly known as:  DIOVAN Take 1 tablet (160 mg total) by mouth daily.       Past Surgical History:  She  has a past surgical history that includes Tubal ligation; Lymph node biopsy; Bone marrow transplant; Hysterotomy (2014); and Dilatation & curettage/hysteroscopy with myosure (N/A, 06/20/2016).  Family History:  Her family history includes Colon cancer in her maternal grandmother; Diabetes in her father; Hypertension in her father and mother; Stomach cancer in an other family member.  Social History:  She  reports that she quit smoking about 18 years ago. Her smoking use included cigarettes. She has a 25.50 pack-year smoking history. She has never used smokeless tobacco. She reports current alcohol use. She reports that she does not use drugs.

## 2018-05-11 NOTE — Patient Instructions (Signed)
Will change auto CPAP to 8 - 17 cm H2O  Follow up in 1 year

## 2018-05-13 DIAGNOSIS — Z13820 Encounter for screening for osteoporosis: Secondary | ICD-10-CM | POA: Diagnosis not present

## 2018-05-22 ENCOUNTER — Other Ambulatory Visit: Payer: Self-pay | Admitting: Family Medicine

## 2018-05-22 DIAGNOSIS — E039 Hypothyroidism, unspecified: Secondary | ICD-10-CM

## 2018-05-22 MED FILL — VALSARTAN 160 MG TABLET: 160 | 90 days supply | Qty: 90 | Fill #1

## 2018-05-22 MED FILL — SYNTHROID 150 MCG TABLET: 150 | 90 days supply | Qty: 90 | Fill #0

## 2018-07-10 DIAGNOSIS — G4733 Obstructive sleep apnea (adult) (pediatric): Secondary | ICD-10-CM | POA: Diagnosis not present

## 2018-07-21 ENCOUNTER — Telehealth: Payer: Self-pay | Admitting: *Deleted

## 2018-07-21 NOTE — Telephone Encounter (Signed)
Pt received 1st Shingrix on 01/29/18. Pt is due now for 2nd Shingrix. Sent mychart message to call and schedule nurse visit to complete the vaccine series. Ok for [facility] to discuss and schedule for pt.

## 2018-07-22 ENCOUNTER — Telehealth: Payer: Self-pay

## 2018-07-22 ENCOUNTER — Ambulatory Visit (INDEPENDENT_AMBULATORY_CARE_PROVIDER_SITE_OTHER): Payer: 59 | Admitting: *Deleted

## 2018-07-22 DIAGNOSIS — Z23 Encounter for immunization: Secondary | ICD-10-CM | POA: Diagnosis not present

## 2018-07-22 NOTE — Telephone Encounter (Signed)
She can have it done there

## 2018-07-22 NOTE — Progress Notes (Signed)
Pt received her 1st injection by Dr. Etter Sjogren @ [facility] office. Per Jonelle Sidle ok to give her 2nd, but will need to send to one of our providers to sign.Marland KitchenJohny Chess

## 2018-07-22 NOTE — Telephone Encounter (Signed)
-----   Message from Ronny Flurry, [place] sent at 07/22/2018  9:44 AM EST ----- Regarding: Shingrix Good morning!  Dr Carollee Herter said you may give Shingrix to [facility]. This will be her 2nd vaccine in the series. Thank you!

## 2018-07-22 NOTE — Telephone Encounter (Signed)
Per dr Etter Sjogren, ok for patient to have 2nd shingrix vaccine

## 2018-07-22 NOTE — Telephone Encounter (Signed)
Dr Carollee Herter-- please see above mychart request and advise?

## 2018-07-22 NOTE — Progress Notes (Signed)
Injection given.   Renne Platts J Jacquese Cassarino, MD  

## 2018-08-22 ENCOUNTER — Other Ambulatory Visit: Payer: Self-pay | Admitting: Family Medicine

## 2018-08-22 DIAGNOSIS — E039 Hypothyroidism, unspecified: Secondary | ICD-10-CM

## 2018-08-22 MED FILL — VALSARTAN 160 MG TABLET: 160 | 30 days supply | Qty: 30 | Fill #2

## 2018-08-25 MED FILL — SYNTHROID 150 MCG TABLET: 150 | 90 days supply | Qty: 90 | Fill #0

## 2018-08-28 MED FILL — ALBUTEROL SULFATE HFA 108 (: 108 (90 BAS | 25 days supply | Qty: 9 | Fill #1

## 2018-10-01 ENCOUNTER — Telehealth: Payer: Self-pay | Admitting: Family Medicine

## 2018-10-01 MED FILL — VALSARTAN 320 MG TAB: 320 | 30 days supply | Qty: 15 | Fill #0

## 2018-10-01 NOTE — Telephone Encounter (Signed)
Pharmacy received order for Valsartan 160 mg. They do not have on hand. Would like to know if they can dispense 320 mg and have er take 1/2 tab. Please advise.

## 2018-10-01 NOTE — Telephone Encounter (Signed)
That is fine --- just let the pt know please

## 2018-10-01 NOTE — Telephone Encounter (Signed)
Pharmacy notified.

## 2018-10-15 DIAGNOSIS — G4733 Obstructive sleep apnea (adult) (pediatric): Secondary | ICD-10-CM | POA: Diagnosis not present

## 2018-11-05 MED FILL — TRIAMCINOLONE 0.1% CREAM: 0.1 | 21 days supply | Qty: 30 | Fill #1

## 2018-11-05 MED FILL — ALBUTEROL SULFATE HFA 108 (: 108 (90 BAS | 25 days supply | Qty: 9 | Fill #1

## 2018-11-11 MED FILL — VALSARTAN 320 MG TAB: 320 | 30 days supply | Qty: 15 | Fill #1

## 2018-12-07 DIAGNOSIS — N904 Leukoplakia of vulva: Secondary | ICD-10-CM | POA: Diagnosis not present

## 2018-12-07 DIAGNOSIS — N9089 Other specified noninflammatory disorders of vulva and perineum: Secondary | ICD-10-CM | POA: Diagnosis not present

## 2018-12-08 MED FILL — SYNTHROID 150 MCG TABLET: 150 | 90 days supply | Qty: 90 | Fill #1

## 2018-12-14 DIAGNOSIS — N766 Ulceration of vulva: Secondary | ICD-10-CM | POA: Diagnosis not present

## 2018-12-14 DIAGNOSIS — N760 Acute vaginitis: Secondary | ICD-10-CM | POA: Diagnosis not present

## 2018-12-14 DIAGNOSIS — L900 Lichen sclerosus et atrophicus: Secondary | ICD-10-CM | POA: Diagnosis not present

## 2018-12-14 MED FILL — VALSARTAN 320 MG TAB: 320 | 30 days supply | Qty: 15 | Fill #2

## 2018-12-14 MED FILL — FLUCONAZOLE 150 MG TABS: 150 | 1 days supply | Qty: 1 | Fill #0

## 2018-12-14 MED FILL — CLOBETASOL PROPIONATE 0.05: 0.05 | 15 days supply | Qty: 30 | Fill #0

## 2019-01-15 ENCOUNTER — Other Ambulatory Visit: Payer: Self-pay | Admitting: Family Medicine

## 2019-01-15 DIAGNOSIS — G4733 Obstructive sleep apnea (adult) (pediatric): Secondary | ICD-10-CM | POA: Diagnosis not present

## 2019-01-18 ENCOUNTER — Other Ambulatory Visit: Payer: Self-pay | Admitting: Family Medicine

## 2019-01-18 MED FILL — VALSARTAN 320 MG TAB: 320 | 30 days supply | Qty: 15 | Fill #0

## 2019-01-25 ENCOUNTER — Telehealth: Payer: Self-pay

## 2019-01-25 NOTE — Telephone Encounter (Signed)
PA initiated via Covermymeds; KEY: [id number]. Awaiting determination.

## 2019-01-27 MED ORDER — BUDESONIDE-FORMOTEROL FUMARATE 80-4.5 MCG/ACT IN AERO: 2.0000 | INHALATION_SPRAY | Freq: Two times a day (BID) | RESPIRATORY_TRACT | 5 refills | Status: DC

## 2019-01-27 MED FILL — SYMBICORT 80-4.5 MCG INH: 80-4.5 | 30 days supply | Qty: 10 | Fill #0

## 2019-01-27 NOTE — Telephone Encounter (Signed)
PA denied. Preferred alternative: Advair HFA, Advair Diskus, Breo Ellipta, Symbicort.

## 2019-01-27 NOTE — Telephone Encounter (Signed)
symbicort 80  2 puffs bid  #1  2 refills ----- please inform pt

## 2019-01-27 NOTE — Telephone Encounter (Signed)
Patient notified and rx sent in 

## 2019-02-16 ENCOUNTER — Other Ambulatory Visit: Payer: Self-pay | Admitting: Family Medicine

## 2019-02-16 MED FILL — VALSARTAN 320 MG TAB: 320 | 30 days supply | Qty: 15 | Fill #0

## 2019-02-23 ENCOUNTER — Other Ambulatory Visit: Payer: Self-pay | Admitting: *Deleted

## 2019-02-23 MED ORDER — VALSARTAN 320 MG PO TABS: ORAL_TABLET | ORAL | 0 refills | Status: DC

## 2019-03-15 ENCOUNTER — Other Ambulatory Visit: Payer: Self-pay | Admitting: Family Medicine

## 2019-03-15 DIAGNOSIS — E039 Hypothyroidism, unspecified: Secondary | ICD-10-CM

## 2019-03-15 MED FILL — SYNTHROID 150 MCG TABLET: 150 | 90 days supply | Qty: 90 | Fill #0

## 2019-03-22 MED FILL — VALSARTAN 320 MG TABS: 320 | 30 days supply | Qty: 15 | Fill #1

## 2019-03-25 ENCOUNTER — Encounter: Payer: Self-pay | Admitting: Family Medicine

## 2019-03-25 NOTE — Telephone Encounter (Signed)
Immunization was updated and added to HM

## 2019-03-29 ENCOUNTER — Other Ambulatory Visit: Payer: Self-pay

## 2019-03-30 ENCOUNTER — Encounter: Payer: Self-pay | Admitting: Family Medicine

## 2019-03-30 ENCOUNTER — Ambulatory Visit (INDEPENDENT_AMBULATORY_CARE_PROVIDER_SITE_OTHER): Payer: 59 | Admitting: Family Medicine

## 2019-03-30 VITALS — BP 106/80 | HR 76 | Temp 97.8°F | Resp 18 | Ht 65.0 in | Wt 221.6 lb

## 2019-03-30 DIAGNOSIS — I429 Cardiomyopathy, unspecified: Secondary | ICD-10-CM | POA: Diagnosis not present

## 2019-03-30 DIAGNOSIS — Z0000 Encounter for general adult medical examination without abnormal findings: Secondary | ICD-10-CM

## 2019-03-30 DIAGNOSIS — I10 Essential (primary) hypertension: Secondary | ICD-10-CM

## 2019-03-30 MED ORDER — VALSARTAN 160 MG PO TABS: 160.0000 mg | ORAL_TABLET | Freq: Every day | ORAL | 3 refills | Status: DC

## 2019-03-30 NOTE — Progress Notes (Signed)
Subjective:     Ashley Mckee is a 53 y.o. female and is here for a comprehensive physical exam. The patient reports no problems.  Social History   Socioeconomic History  . Marital status: Married    Spouse name: DAVID   . Number of children: 3  . Years of education: 61  . Highest education level: Not on file  Occupational History  . 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 Financial resource strain: Not on file  . Food insecurity    Worry: Not on file    Inability: Not on file  . Transportation needs    Medical: Not on file    Non-medical: Not on file  Tobacco Use  . Smoking status: Former Smoker    Packs/day: 1.50    Years: 17.00    Pack years: 25.50    Types: Cigarettes    Quit date: 05/28/1999    Years since quitting: 19.8  . Smokeless tobacco: Never Used  Substance and Sexual Activity  . Alcohol use: Yes    Comment: Rarely   . Drug use: No  . Sexual activity: Yes    Partners: Male    Birth control/protection: Post-menopausal  Lifestyle  . Physical activity    Days per week: Not on file    Minutes per session: Not on file  . Stress: Not on file  Relationships  . Social Herbalist on phone: Not on file    Gets together: Not on file    Attends religious service: Not on file    Active member of club or organization: Not on file    Attends meetings of clubs or organizations: Not on file    Relationship status: Not on file  . Intimate partner violence    Fear of current or ex partner: Not on file    Emotionally abused: Not on file    Physically abused: Not on file    Forced sexual activity: Not on file  Other Topics Concern  . Not on file  Social History Narrative   Marital Status:  Married Ashley Mckee)   Children:  Daughter (3)    Pets: Dog (2) Cat (1)    Living Situation: Lives with husband and kids    Occupation: Risk manager)     Education: Environmental education officer    Tobacco Use/Exposure:  Former  Smoker    Alcohol Use:  Rarely    Drug Use:  None   Diet:  Regular   Exercise:  None   Hobbies:  Walking             Health Maintenance  Topic Date Due  . MAMMOGRAM  04/11/2017  . PAP SMEAR-Modifier  01/18/2018  . COLONOSCOPY  07/26/2021  . TETANUS/TDAP  07/25/2024  . INFLUENZA VACCINE  Completed  . 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 She does not have any pertinent problems on file. She  has a past surgical history that includes Tubal ligation; Lymph node biopsy; Bone marrow transplant; Hysterotomy (2014); and Dilatation & curettage/hysteroscopy with myosure (N/A, 06/20/2016). Her family history  includes Colon cancer in her maternal grandmother; Dementia in her mother; Diabetes in her father; Hypertension in her father and mother; Stomach cancer in an other family member. She  reports that she quit smoking about 19 years ago. Her smoking use included cigarettes. She has a 25.50 pack-year smoking history. She has never used smokeless tobacco. She reports current alcohol use. She reports that she does not use drugs. She has a current medication list which includes the following prescription(s): albuterol sulfate, budesonide-formoterol, clobetasol cream, synthroid, and valsartan, and the following Facility-Administered Medications: sodium chloride. Current Outpatient Medications on File Prior to Visit  Medication Sig Dispense Refill  . Albuterol Sulfate 108 (90 Base) MCG/ACT AEPB Inhale 2 puffs into the lungs 4 (four) times daily. 1 each  2  . budesonide-formoterol (SYMBICORT) 80-4.5 MCG/ACT inhaler Inhale 2 puffs into the lungs 2 (two) times daily. 1 Inhaler 5  . clobetasol cream (TEMOVATE) [id number] % Apply 1 application topically as needed.  6  . SYNTHROID 150 MCG tablet TAKE 1 TABLET (150 MCG TOTAL) BY MOUTH DAILY. 90 tablet 0   Current Facility-Administered Medications on File Prior to Visit  Medication Dose Route Frequency Provider Last Rate Last Dose  . 0.9 %  sodium chloride infusion  500 mL Intravenous Continuous Armbruster, Carlota Raspberry, MD       She is allergic to ace inhibitors..  Review of Systems Review of Systems  Constitutional: Negative for activity change, appetite change and fatigue.  HENT: Negative for hearing loss, congestion, tinnitus and ear discharge.  dentist q47m Eyes: Negative for visual disturbance (see optho q1y -- vision corrected to 20/20 with glasses).  Respiratory: Negative for cough, chest tightness and shortness of breath.   Cardiovascular: Negative for chest pain, palpitations and leg swelling.  Gastrointestinal: Negative for abdominal pain, diarrhea, constipation and abdominal distention.  Genitourinary: Negative for urgency, frequency, decreased urine volume and difficulty urinating.  Musculoskeletal: Negative for back pain, arthralgias and gait problem.  Skin: Negative for color change, pallor and rash.  Neurological: Negative for dizziness, light-headedness, numbness and headaches.  Hematological: Negative for adenopathy. Does not bruise/bleed easily.  Psychiatric/Behavioral: Negative for suicidal ideas, confusion, sleep disturbance, self-injury, dysphoric mood, decreased concentration and agitation.       Objective:    BP 106/80 (BP Location: Right Arm, Patient Position: Sitting, Cuff Size: Normal)   Pulse 76   Temp 97.8 F (36.6 C) (Temporal)   Resp 18   Ht 5\' 5"  (1.651 m)   Wt 221 lb 9.6 oz (100.5 kg)   SpO2 97%   BMI 36.88 kg/m  General appearance: alert, cooperative, appears  stated age and no distress Head: Normocephalic, without obvious abnormality, atraumatic Eyes: negative findings: lids and lashes normal, conjunctivae and sclerae normal and pupils equal, round, reactive to light and accomodation Ears: normal TM's and external ear canals both ears Neck: no adenopathy, no carotid bruit, no JVD, supple, symmetrical, trachea midline and thyroid not enlarged, symmetric, no tenderness/mass/nodules Back: symmetric, no curvature. ROM normal. No CVA tenderness. Lungs: clear to auscultation bilaterally Breasts: normal appearance, no masses or tenderness Heart: S1, S2 normal Abdomen: soft, non-tender; bowel sounds normal; no masses,  no organomegaly Pelvic: deferred Extremities: extremities normal, atraumatic, no cyanosis or edema Pulses: 2+ and symmetric Skin: Skin color, texture, turgor normal. No rashes or lesions Lymph nodes: Cervical, supraclavicular, and axillary nodes normal. Neurologic: Alert and oriented X 3, normal strength and tone. Normal symmetric reflexes. Normal coordination and gait    Assessment:    Healthy  female exam.      Plan:    ghm utd Check labs  See After Visit Summary for Counseling Recommendations    1. Essential hypertension Well controlled, no changes to meds. Encouraged heart healthy diet such as the DASH diet and exercise as tolerated.  - valsartan (DIOVAN) 160 MG tablet; Take 1 tablet (160 mg total) by mouth daily.  Dispense: 90 tablet; Refill: 3 - Lipid panel - CBC with Differential/Platelet - TSH - Comprehensive metabolic panel 2. Preventative health care See above  - Lipid panel - CBC with Differential/Platelet - TSH - Comprehensive metabolic panel  3. Cardiomyopathy, unspecified type ([place]) Check echo  - ECHOCARDIOGRAM COMPLETE; Future

## 2019-03-30 NOTE — Patient Instructions (Signed)

## 2019-03-31 LAB — CBC WITH DIFFERENTIAL/PLATELET
Basophils Absolute: 0 10*3/uL (ref 0.0–0.1)
Basophils Relative: 0.6 % (ref 0.0–3.0)
Eosinophils Absolute: 0.2 10*3/uL (ref 0.0–0.7)
Eosinophils Relative: 2.4 % (ref 0.0–5.0)
HCT: 40.6 % (ref 36.0–46.0)
Hemoglobin: 13.7 g/dL (ref 12.0–15.0)
Lymphocytes Relative: 26.2 % (ref 12.0–46.0)
Lymphs Abs: 1.8 10*3/uL (ref 0.7–4.0)
MCHC: 33.8 g/dL (ref 30.0–36.0)
MCV: 93.4 fl (ref 78.0–100.0)
Monocytes Absolute: 0.7 10*3/uL (ref 0.1–1.0)
Monocytes Relative: 10.1 % (ref 3.0–12.0)
Neutro Abs: 4.2 10*3/uL (ref 1.4–7.7)
Neutrophils Relative %: 60.7 % (ref 43.0–77.0)
Platelets: 240 10*3/uL (ref 150.0–400.0)
RBC: 4.35 Mil/uL (ref 3.87–5.11)
RDW: 14 % (ref 11.5–15.5)
WBC: 6.9 10*3/uL (ref 4.0–10.5)

## 2019-03-31 LAB — TSH: TSH: 1.11 u[IU]/mL (ref 0.35–4.50)

## 2019-03-31 LAB — LIPID PANEL
Cholesterol: 212 mg/dL — ABNORMAL HIGH (ref 0–200)
HDL: 55.5 mg/dL (ref >39.00)
LDL Cholesterol: 137 mg/dL — ABNORMAL HIGH (ref 0–99)
NonHDL: 156.57
Total CHOL/HDL Ratio: 4
Triglycerides: 100 mg/dL (ref 0.0–149.0)
VLDL: 20 mg/dL (ref 0.0–40.0)

## 2019-03-31 LAB — COMPREHENSIVE METABOLIC PANEL
ALT: 22 U/L (ref 0–35)
AST: 15 U/L (ref 0–37)
Albumin: 4.4 g/dL (ref 3.5–5.2)
Alkaline Phosphatase: 81 U/L (ref 39–117)
BUN: 13 mg/dL (ref 6–23)
CO2: 31 mEq/L (ref 19–32)
Calcium: 9.5 mg/dL (ref 8.4–10.5)
Chloride: 101 mEq/L (ref 96–112)
Creatinine, Ser: 0.88 mg/dL (ref 0.40–1.20)
GFR: 67.6 mL/min (ref >60.00)
Glucose, Bld: 80 mg/dL (ref 70–99)
Potassium: 4.4 mEq/L (ref 3.5–5.1)
Sodium: 139 mEq/L (ref 135–145)
Total Bilirubin: 0.7 mg/dL (ref 0.2–1.2)
Total Protein: 7.4 g/dL (ref 6.0–8.3)

## 2019-04-07 ENCOUNTER — Other Ambulatory Visit: Payer: Self-pay

## 2019-04-07 ENCOUNTER — Ambulatory Visit (HOSPITAL_COMMUNITY): Payer: 59 | Attending: Cardiovascular Disease

## 2019-04-07 DIAGNOSIS — I429 Cardiomyopathy, unspecified: Secondary | ICD-10-CM | POA: Insufficient documentation

## 2019-04-07 MED ORDER — PERFLUTREN LIPID MICROSPHERE
1.0000 mL | INTRAVENOUS | Status: AC | PRN
  Administered 2019-04-07: 2 mL via INTRAVENOUS
  Administered 2019-04-07: 1 mL via INTRAVENOUS

## 2019-04-12 DIAGNOSIS — G4733 Obstructive sleep apnea (adult) (pediatric): Secondary | ICD-10-CM | POA: Diagnosis not present

## 2019-04-19 DIAGNOSIS — Z01419 Encounter for gynecological examination (general) (routine) without abnormal findings: Secondary | ICD-10-CM | POA: Diagnosis not present

## 2019-04-19 DIAGNOSIS — Z6837 Body mass index (BMI) 37.0-37.9, adult: Secondary | ICD-10-CM | POA: Diagnosis not present

## 2019-04-19 DIAGNOSIS — I6529 Occlusion and stenosis of unspecified carotid artery: Secondary | ICD-10-CM | POA: Diagnosis not present

## 2019-04-23 MED FILL — VALSARTAN 320 MG TABS: 320 | 30 days supply | Qty: 15 | Fill #2

## 2019-05-03 MED FILL — CLOBETASOL PROPIONATE 0.05: 0.05 | 14 days supply | Qty: 30 | Fill #1

## 2019-05-03 MED FILL — SYMBICORT 80-4.5 MCG INH: 80-4.5 | 30 days supply | Qty: 10 | Fill #1

## 2019-05-11 ENCOUNTER — Other Ambulatory Visit: Payer: Self-pay

## 2019-05-11 ENCOUNTER — Encounter: Payer: Self-pay | Admitting: Pulmonary Disease

## 2019-05-11 ENCOUNTER — Ambulatory Visit (INDEPENDENT_AMBULATORY_CARE_PROVIDER_SITE_OTHER): Payer: 59 | Admitting: Pulmonary Disease

## 2019-05-11 ENCOUNTER — Other Ambulatory Visit: Payer: Self-pay | Admitting: Family Medicine

## 2019-05-11 VITALS — BP 112/80 | HR 78 | Temp 97.5°F | Ht 65.0 in | Wt 229.4 lb

## 2019-05-11 DIAGNOSIS — G4733 Obstructive sleep apnea (adult) (pediatric): Secondary | ICD-10-CM | POA: Diagnosis not present

## 2019-05-11 DIAGNOSIS — Z9989 Dependence on other enabling machines and devices: Secondary | ICD-10-CM | POA: Diagnosis not present

## 2019-05-11 MED FILL — ALBUTEROL SULFATE HFA 108 (: 108 (90 BAS | 25 days supply | Qty: 18 | Fill #0

## 2019-05-11 NOTE — Patient Instructions (Signed)
Follow up in 1 year.

## 2019-05-11 NOTE — Progress Notes (Signed)
[facility]  Chief Complaint  Patient presents with  . Follow-up    Patient is here for sleep apnea. Patient states that she feels good overall.     Constitutional:  BP 112/80 (BP Location: Right Arm, Patient Position: Sitting, Cuff Size: Normal)   Pulse 78   Temp (!) 97.5 F (36.4 C) (Temporal)   Ht 5\' 5"  (1.651 m)   Wt 229 lb 6.4 oz (104.1 kg)   SpO2 97% Comment: on RA  BMI 38.17 kg/m   Past Medical History:  Hypothyroidism, Non Hodgkin's Lymphoma 2000 s/p BMT, B cell lymphoma 2016, Lt diaphragm dysfunction, Asthma  Brief Summary:  Ashley Mckee is a 53 y.o. female with obstructive sleep apnea.  She has been doing well with CPAP.  No issues with mask fit.  Not having sinus congestion, dry mouth, sore throat or aerophagia.  Physical Exam:   Appearance - well kempt   ENMT - no sinus tenderness, no nasal discharge, no oral exudate  Neck - no masses, trachea midline, no thyromegaly, no elevation in JVP  Respiratory - normal appearance of chest wall, normal respiratory effort w/o accessory muscle use, no dullness on percussion, no wheezing or rales  CV - s1s2 regular rate and rhythm, no murmurs, no peripheral edema, radial pulses symmetric  GI - soft, non tender  Lymph - no adenopathy noted in neck and axillary areas  MSK - normal gait  Ext - no cyanosis, clubbing, or joint inflammation noted  Skin - no rashes, lesions, or ulcers  Neuro - normal strength, oriented x 3  Psych - normal mood and affect   Assessment/Plan:   Obstructive sleep apnea. - she is compliant with CPAP and reports benefit from therapy - continue auto CPAP 8 - 17 cm H2O  Obesity. - worked on [employer] loss   Patient Instructions  Follow up in 1 year    Chesley Mires, MD [place] Pager: [phone] 05/11/2019, 12:17 PM  Flow Sheet     Pulmonary tests:  PFT 06/02/15 >> FEV1 1.33 (43%), FEV1% 74, TLC 3.24 (60%), DLCO  71%, +BD  Chest imaging:  CT angio chest 03/20/15 >> post XRT changes LUL/LLL and RUL, calcified granulomas RUL/LUL/LLL  Sleep tests:  HST 03/12/16 >> AHI 33.7, SaO2 low 77% Auto CPAP 04/10/19 to 05/09/19 >> used on 30 of 30 nights with average 6 hrs 40 min.  Average AHI 0.2 with median CPAP 12 and 95 th percentile CPAP 15 cm H2O.  Cardiac tests:  Echo 04/07/19 >> EF 50 to 55%  Medications:   Allergies as of 05/11/2019      Reactions   Ace Inhibitors Cough      Medication List       Accurate as of May 11, 2019 12:17 PM. If you have any questions, ask your nurse or doctor.        Albuterol Sulfate 108 (90 Base) MCG/ACT Aepb Inhale 2 puffs into the lungs 4 (four) times daily.   budesonide-formoterol 80-4.5 MCG/ACT inhaler Commonly known as: SYMBICORT Inhale 2 puffs into the lungs 2 (two) times daily.   clobetasol cream 0.05 % Commonly known as: TEMOVATE Apply 1 application topically as needed.   Synthroid 150 MCG tablet Generic drug: levothyroxine TAKE 1 TABLET (150 MCG TOTAL) BY MOUTH DAILY.   valsartan 160 MG tablet Commonly known as: Diovan Take 1 tablet (160 mg total) by mouth daily.       Past Surgical History:  She  has  a past surgical history that includes Tubal ligation; Lymph node biopsy; Bone marrow transplant; Hysterotomy (2014); and Dilatation & curettage/hysteroscopy with myosure (N/A, 06/20/2016).  Family History:  Her family history includes Colon cancer in her maternal grandmother; Dementia in her mother; Diabetes in her father; Hypertension in her father and mother; Stomach cancer in an other family member.  Social History:  She  reports that she quit smoking about 19 years ago. Her smoking use included cigarettes. She has a 25.50 pack-year smoking history. She has never used smokeless tobacco. She reports current alcohol use. She reports that she does not use drugs.

## 2019-05-31 ENCOUNTER — Other Ambulatory Visit: Payer: Self-pay | Admitting: Family Medicine

## 2019-05-31 MED FILL — VALSARTAN 320 MG TABS: 320 | 30 days supply | Qty: 15 | Fill #0

## 2019-06-01 DIAGNOSIS — Z1231 Encounter for screening mammogram for malignant neoplasm of breast: Secondary | ICD-10-CM | POA: Diagnosis not present

## 2019-06-22 ENCOUNTER — Other Ambulatory Visit: Payer: Self-pay | Admitting: Family Medicine

## 2019-06-22 DIAGNOSIS — E039 Hypothyroidism, unspecified: Secondary | ICD-10-CM

## 2019-06-22 MED FILL — SYNTHROID 150 MCG TABLET: 150 | 90 days supply | Qty: 90 | Fill #0

## 2019-06-28 ENCOUNTER — Encounter: Payer: Self-pay | Admitting: Family Medicine

## 2019-06-30 MED FILL — VALSARTAN 160 MG TABLET: 160 | 90 days supply | Qty: 90 | Fill #0

## 2019-07-05 ENCOUNTER — Other Ambulatory Visit: Payer: Self-pay | Admitting: Internal Medicine

## 2019-07-05 MED ORDER — AMOXICILLIN-POT CLAVULANATE 875-125 MG PO TABS: 1.0000 | ORAL_TABLET | Freq: Two times a day (BID) | ORAL | 0 refills | Status: DC

## 2019-07-05 MED FILL — AMOX-CLAV 875-125 MG TABLET: 875-125 | 7 days supply | Qty: 14 | Fill #0

## 2019-07-27 DIAGNOSIS — G4733 Obstructive sleep apnea (adult) (pediatric): Secondary | ICD-10-CM | POA: Diagnosis not present

## 2019-08-09 ENCOUNTER — Encounter: Payer: Self-pay | Admitting: Family Medicine

## 2019-08-09 ENCOUNTER — Ambulatory Visit (INDEPENDENT_AMBULATORY_CARE_PROVIDER_SITE_OTHER): Payer: 59 | Admitting: Family Medicine

## 2019-08-09 ENCOUNTER — Other Ambulatory Visit: Payer: Self-pay

## 2019-08-09 VITALS — BP 122/80 | HR 86 | Temp 97.1°F | Resp 18 | Ht 65.0 in | Wt 224.4 lb

## 2019-08-09 DIAGNOSIS — N390 Urinary tract infection, site not specified: Secondary | ICD-10-CM | POA: Diagnosis not present

## 2019-08-09 DIAGNOSIS — R319 Hematuria, unspecified: Secondary | ICD-10-CM

## 2019-08-09 LAB — POC URINALSYSI DIPSTICK (AUTOMATED)
Bilirubin, UA: NEGATIVE
Glucose, UA: NEGATIVE
Ketones, UA: NEGATIVE
Nitrite, UA: NEGATIVE
Protein, UA: NEGATIVE
Spec Grav, UA: 1.01 (ref 1.010–1.025)
Urobilinogen, UA: 1 E.U./dL
pH, UA: 6 (ref 5.0–8.0)

## 2019-08-09 MED ORDER — NITROFURANTOIN MONOHYD MACRO 100 MG PO CAPS: 100.0000 mg | ORAL_CAPSULE | Freq: Two times a day (BID) | ORAL | 0 refills | Status: DC

## 2019-08-09 MED FILL — NITROFURANTOIN MONO-MCR 100: 100 | 7 days supply | Qty: 14 | Fill #0

## 2019-08-09 NOTE — Patient Instructions (Signed)
Urinary Tract Infection, Adult A urinary tract infection (UTI) is an infection of any part of the urinary tract. The urinary tract includes:  The kidneys.  The ureters.  The bladder.  The urethra. These organs make, store, and get rid of pee (urine) in the body. What are the causes? This is caused by germs (bacteria) in your genital area. These germs grow and cause swelling (inflammation) of your urinary tract. What increases the risk? You are more likely to develop this condition if:  You have a small, thin tube (catheter) to drain pee.  You cannot control when you pee or poop (incontinence).  You are female, and: ? You use these methods to prevent pregnancy:  A medicine that kills sperm (spermicide).  A device that blocks sperm (diaphragm). ? You have low levels of a female hormone (estrogen). ? You are pregnant.  You have genes that add to your risk.  You are sexually active.  You take antibiotic medicines.  You have trouble peeing because of: ? A prostate that is bigger than normal, if you are female. ? A blockage in the part of your body that drains pee from the bladder (urethra). ? A kidney stone. ? A nerve condition that affects your bladder (neurogenic bladder). ? Not getting enough to drink. ? Not peeing often enough.  You have other conditions, such as: ? Diabetes. ? A weak disease-fighting system (immune system). ? Sickle cell disease. ? Gout. ? Injury of the spine. What are the signs or symptoms? Symptoms of this condition include:  Needing to pee right away (urgently).  Peeing often.  Peeing small amounts often.  Pain or burning when peeing.  Blood in the pee.  Pee that smells bad or not like normal.  Trouble peeing.  Pee that is cloudy.  Fluid coming from the vagina, if you are female.  Pain in the belly or lower back. Other symptoms include:  Throwing up (vomiting).  No urge to eat.  Feeling mixed up (confused).  Being tired  and grouchy (irritable).  A fever.  Watery poop (diarrhea). How is this treated? This condition may be treated with:  Antibiotic medicine.  Other medicines.  Drinking enough water. Follow these instructions at home:  Medicines  Take over-the-counter and prescription medicines only as told by your doctor.  If you were prescribed an antibiotic medicine, take it as told by your doctor. Do not stop taking it even if you start to feel better. General instructions  Make sure you: ? Pee until your bladder is empty. ? Do not hold pee for a long time. ? Empty your bladder after sex. ? Wipe from front to back after pooping if you are a female. Use each tissue one time when you wipe.  Drink enough fluid to keep your pee pale yellow.  Keep all follow-up visits as told by your doctor. This is important. Contact a doctor if:  You do not get better after 1-2 days.  Your symptoms go away and then come back. Get help right away if:  You have very bad back pain.  You have very bad pain in your lower belly.  You have a fever.  You are sick to your stomach (nauseous).  You are throwing up. Summary  A urinary tract infection (UTI) is an infection of any part of the urinary tract.  This condition is caused by germs in your genital area.  There are many risk factors for a UTI. These include having a small, thin   tube to drain pee and not being able to control when you pee or poop.  Treatment includes antibiotic medicines for germs.  Drink enough fluid to keep your pee pale yellow. This information is not intended to replace advice given to you by your health care provider. Make sure you discuss any questions you have with your health care provider. Document Revised: 04/30/2018 Document Reviewed: 11/20/2017 Elsevier Patient Education  2020 Elsevier Inc.  

## 2019-08-09 NOTE — Progress Notes (Signed)
Patient ID: Colleena Barlas, female    DOB: [birth year]  Age: 54 y.o. MRN: [id number]    Subjective:  Subjective  HPI Rayen Sausedo presents for dysuria and hematuria since this am.   No fever or back pain.    Review of Systems  Constitutional: Negative for activity change, appetite change, fatigue and unexpected weight change.  Respiratory: Negative for cough and shortness of breath.   Cardiovascular: Negative for chest pain and palpitations.  Genitourinary: Positive for dysuria, frequency and hematuria. Negative for decreased urine volume, flank pain, vaginal bleeding, vaginal discharge and vaginal pain.  Psychiatric/Behavioral: Negative for behavioral problems and dysphoric mood. The patient is not nervous/anxious.     History Past Medical History:  Diagnosis Date  . Asthma   . 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 CHF (congestive heart failure) ([facility])    Last EF was >50%   . Diaphragm dysfunction    Left Side - She thinks this resulted after her chemotherapy.    . Diaphragm paralysis    left  . Dyspnea   . Heart murmur   . 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 Hypothyroidism   . Non Hodgkin's lymphoma ([place]) 2000   Non Hodgkin's Lymphoma (Bone Marrow Transplant)  - [place]  . OSA (obstructive sleep apnea) 03/13/2016  . Sleep apnea    Uses CPAP  . Thyroid disease     She has a past surgical history that includes Tubal ligation; Lymph node biopsy; Bone marrow transplant; Hysterotomy (2014); and Dilatation & curettage/hysteroscopy with myosure (N/A, 06/20/2016).   Her family history includes Colon cancer in her maternal grandmother; Dementia in her mother; Diabetes in her father; Hypertension in her father and mother; Stomach cancer in an other family member.She reports that she quit smoking about 20 years ago. Her smoking use included cigarettes. She has a 25.50 pack-year smoking history. She has never used smokeless tobacco. She  reports current alcohol use. She reports that she does not use drugs.  Current Outpatient Medications on File Prior to Visit  Medication Sig Dispense Refill  . albuterol (VENTOLIN HFA) 108 (90 Base) MCG/ACT inhaler INHALE 2 PUFFS BY MOUTH INTO LUNGS FOUR TIMES DAILY 8.5 g 1  . budesonide-formoterol (SYMBICORT) 80-4.5 MCG/ACT inhaler Inhale 2 puffs into the lungs 2 (two) times daily. 1 Inhaler 5  . clobetasol cream (TEMOVATE) [id number] % Apply 1 application topically as needed.  6  . SYNTHROID 150 MCG tablet TAKE 1 TABLET BY MOUTH ONCE DAILY 90 tablet 1  . valsartan (DIOVAN) 160 MG tablet Take 1 tablet (160 mg total) by mouth daily. 90 tablet 3   Current Facility-Administered Medications on File Prior to Visit  Medication Dose Route Frequency Provider Last Rate Last Admin  . 0.9 %  sodium chloride infusion  500 mL Intravenous Continuous Armbruster, Carlota Raspberry, MD         Objective:  Objective  Physical Exam Vitals and nursing note reviewed.  Constitutional:      Appearance: She is well-developed.  HENT:     Head: Normocephalic and atraumatic.  Eyes:     Conjunctiva/sclera: Conjunctivae normal.  Neck:     Thyroid: No thyromegaly.     Vascular: No carotid bruit or JVD.  Pulmonary:     Effort: Pulmonary effort is normal. No respiratory distress.     Breath sounds: Normal breath sounds. No wheezing or rales.  Chest:     Chest wall: No tenderness.  Abdominal:  General: There is no distension.     Palpations: Abdomen is soft. There is no mass.     Tenderness: There is no abdominal tenderness. There is no right CVA tenderness, left CVA tenderness, guarding or rebound.  Musculoskeletal:     Cervical back: Normal range of motion and neck supple.  Neurological:     Mental Status: She is alert and oriented to person, place, and time.    BP 122/80 (BP Location: Left Arm, Patient Position: Sitting, Cuff Size: Normal)   Pulse 86   Temp (!) 97.1 F (36.2 C) (Temporal)   Resp 18   Ht 5\' 5"   (1.651 m)   Wt 224 lb 6.4 oz (101.8 kg)   SpO2 98%   BMI 37.34 kg/m  Wt Readings from Last 3 Encounters:  08/09/19 224 lb 6.4 oz (101.8 kg)  05/11/19 229 lb 6.4 oz (104.1 kg)  03/30/19 221 lb 9.6 oz (100.5 kg)     Lab Results  Component Value Date   WBC 6.9 03/30/2019   HGB 13.7 03/30/2019   HCT 40.6 03/30/2019   PLT 240.0 03/30/2019   GLUCOSE 80 03/30/2019   CHOL 212 (H) 03/30/2019   TRIG 100.0 03/30/2019   HDL 55.50 03/30/2019   LDLCALC 137 (H) 03/30/2019   ALT 22 03/30/2019   AST 15 03/30/2019   NA 139 03/30/2019   K 4.4 03/30/2019   CL 101 03/30/2019   CREATININE 0.88 03/30/2019   BUN 13 03/30/2019   CO2 31 03/30/2019   TSH 1.11 03/30/2019   INR 1.11 03/12/2015   HGBA1C 5.4 02/18/2017    No results found. ua-- + leuk and blood   Assessment & Plan:  Plan  I have discontinued Mona Ruder's amoxicillin-clavulanate. I am also having her start on nitrofurantoin (macrocrystal-monohydrate). Additionally, I am having her maintain her clobetasol cream, budesonide-formoterol, valsartan, albuterol, and Synthroid. We will continue to administer sodium chloride.  Meds ordered this encounter  Medications  . nitrofurantoin, macrocrystal-monohydrate, (MACROBID) 100 MG capsule    Sig: Take 1 capsule (100 mg total) by mouth 2 (two) times daily.    Dispense:  14 capsule    Refill:  0    Problem List Items Addressed This Visit      Unprioritized   Hematuria - Primary   Relevant Orders   POCT Urinalysis Dipstick (Automated) (Completed)   Urine Culture   POCT Urinalysis Dipstick (Automated)    Other Visit Diagnoses    Urinary tract infection with hematuria, site unspecified       Relevant Medications   nitrofurantoin, macrocrystal-monohydrate, (MACROBID) 100 MG capsule   Other Relevant Orders   POCT Urinalysis Dipstick (Automated)      Follow-up: Return in about 2 weeks (around 08/23/2019), or if symptoms worsen or fail to improve, for recheck urine.  Ann Held, DO

## 2019-08-10 LAB — URINE CULTURE
MICRO NUMBER:: 10251899
SPECIMEN QUALITY:: ADEQUATE

## 2019-08-23 ENCOUNTER — Ambulatory Visit: Payer: 59 | Admitting: Surgery

## 2019-08-23 ENCOUNTER — Encounter (HOSPITAL_COMMUNITY): Payer: 59

## 2019-09-02 ENCOUNTER — Telehealth (HOSPITAL_COMMUNITY): Payer: Self-pay

## 2019-09-02 ENCOUNTER — Other Ambulatory Visit: Payer: Self-pay | Admitting: *Deleted

## 2019-09-02 DIAGNOSIS — I6529 Occlusion and stenosis of unspecified carotid artery: Secondary | ICD-10-CM

## 2019-09-02 NOTE — Telephone Encounter (Signed)

## 2019-09-06 ENCOUNTER — Ambulatory Visit (HOSPITAL_COMMUNITY)
Admission: RE | Admit: 2019-09-06 | Discharge: 2019-09-06 | Disposition: A | Discharge status: Home / Self Care | Payer: 59 | Source: Ambulatory Visit | Attending: Surgery | Admitting: Surgery

## 2019-09-06 ENCOUNTER — Other Ambulatory Visit: Payer: Self-pay

## 2019-09-06 ENCOUNTER — Encounter: Payer: Self-pay | Admitting: Family Medicine

## 2019-09-06 ENCOUNTER — Ambulatory Visit (INDEPENDENT_AMBULATORY_CARE_PROVIDER_SITE_OTHER): Payer: 59 | Admitting: Surgery

## 2019-09-06 ENCOUNTER — Encounter: Payer: Self-pay | Admitting: Surgery

## 2019-09-06 VITALS — BP 101/66 | HR 88 | Temp 97.7°F | Resp 20 | Ht 65.0 in | Wt 217.0 lb

## 2019-09-06 DIAGNOSIS — I6529 Occlusion and stenosis of unspecified carotid artery: Secondary | ICD-10-CM

## 2019-09-06 NOTE — Progress Notes (Signed)
Vascular and Vein Specialist of [facility]  Patient name: Ashley Mckee MRN: [id number] DOB: 03-19-1966 Sex: female     REASON FOR VISIT    Follow up  HISTORY OF PRESENT ILLNESS:   Ashley Mckee is a 54 y.o. female, who is back today for follow up.  I initially saw her in 2018 for possible carotid stenosis when a bruit was heard on physical exam.  She was asymptomatic at the time.  Her carotid studies showed no significant carotid stenosis but she had an abnormal vertebral artery waveform.  I wanted to have her follow-up for that.  She has not had any neurologic symptoms such as numbness or weakness in either extremity, slurred speech, or amaurosis fugax.  She denies any dizziness or balance problems.  The patient has a history of non-Hodgkin's lymphoma.  She was treated with chest wall radiation she is on ARB for hypertension.  She has a history of congestive heart failure however her last ejection fraction was 50%.  She is a former smoker.    PAST MEDICAL HISTORY    Past Medical History:  Diagnosis Date  . Asthma   . 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 CHF (congestive heart failure) (HCC)    Last EF was >50%   . Diaphragm dysfunction    Left Side - She thinks this resulted after her chemotherapy.    . Diaphragm paralysis    left  . Dyspnea   . Heart murmur   . 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 Hypothyroidism   . Non Hodgkin's lymphoma ([place]) 2000   Non Hodgkin's Lymphoma (Bone Marrow Transplant)  - [place]  . OSA (obstructive sleep apnea) 03/13/2016  . Sleep apnea    Uses CPAP  . Thyroid disease      FAMILY HISTORY   Family History  Problem Relation Age of Onset  . Hypertension Mother   . Dementia Mother   . Diabetes Father   . Hypertension Father   . Colon cancer Maternal Grandmother   . Stomach cancer Other        maternal great grandmother  . Esophageal cancer Neg Hx     . Rectal cancer Neg Hx   . Liver cancer Neg Hx     SOCIAL HISTORY:   Social History   Socioeconomic History  . Marital status: Married    Spouse name: DAVID   . Number of children: 3  . Years of education: 38  . Highest education level: Not on file  Occupational History  . 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 Smoking status: Former Smoker    Packs/day: 1.50    Years: 17.00    Pack years: 25.50    Types: Cigarettes    Quit date: 05/28/1999    Years since quitting: 20.2  . Smokeless tobacco: Never Used  Substance and Sexual Activity  . Alcohol use: Yes    Comment: Rarely   . Drug use: No  . Sexual activity: Yes    Partners: Male    Birth control/protection: Post-menopausal  Other Topics Concern  . Not on file  Social History Narrative   Marital Status:  Married [name])   Children:  Daughter (3)    Pets: Dog (2) Cat (1)    Living Situation: Lives with husband and kids    Occupation: Risk manager)     Education: Environmental education officer    Tobacco Use/Exposure:  Former Smoker    Alcohol Use:  Rarely    Drug Use:  None   Diet:  Regular   Exercise:  None   Hobbies:  Walking             Social Determinants of Radio broadcast assistant Strain:   . Difficulty of Paying Living Expenses:   Food Insecurity:   . Worried About Charity fundraiser in the Last Year:   . Arboriculturist in the Last Year:   Transportation Needs:   . Film/video editor (Medical):   Marland Kitchen Lack of Transportation (Non-Medical):   Physical Activity:   . Days of Exercise per Week:   . Minutes of Exercise per Session:   Stress:   . Feeling of Stress :   Social Connections:   . Frequency of Communication with Friends and Family:   . Frequency of Social Gatherings with Friends and Family:   . Attends Religious Services:   . Active Member of Clubs or Organizations:   . Attends Archivist Meetings:   Marland Kitchen Marital Status:   Intimate  Partner Violence:   . Fear of Current or Ex-Partner:   . Emotionally Abused:   Marland Kitchen Physically Abused:   . Sexually Abused:     ALLERGIES:    Allergies  Allergen Reactions  . Ace Inhibitors Cough    CURRENT MEDICATIONS:    Current Outpatient Medications  Medication Sig Dispense Refill  . albuterol (VENTOLIN HFA) 108 (90 Base) MCG/ACT inhaler INHALE 2 PUFFS BY MOUTH INTO LUNGS FOUR TIMES DAILY 8.5 g 1  . budesonide-formoterol (SYMBICORT) 80-4.5 MCG/ACT inhaler Inhale 2 puffs into the lungs 2 (two) times daily. 1 Inhaler 5  . clobetasol cream (TEMOVATE) [id number] % Apply 1 application topically as needed.  6  . nitrofurantoin, macrocrystal-monohydrate, (MACROBID) 100 MG capsule Take 1 capsule (100 mg total) by mouth 2 (two) times daily. 14 capsule 0  . SYNTHROID 150 MCG tablet TAKE 1 TABLET BY MOUTH ONCE DAILY 90 tablet 1  . valsartan (DIOVAN) 160 MG tablet Take 1 tablet (160 mg total) by mouth daily. 90 tablet 3   Current Facility-Administered Medications  Medication Dose Route Frequency Provider Last Rate Last Admin  . 0.9 %  sodium chloride infusion  500 mL Intravenous Continuous Armbruster, Carlota Raspberry, MD        REVIEW OF SYSTEMS:   [X]  denotes positive finding, [ ]  denotes negative finding Cardiac  Comments:  Chest pain or chest pressure:    Shortness of breath upon exertion:    Short of breath when lying flat:    Irregular heart rhythm:        Vascular    Pain in calf, thigh, or hip brought on by ambulation:    Pain in feet at night that wakes you up from your sleep:     Blood clot in your veins:    Leg swelling:         Pulmonary    Oxygen at home:    Productive cough:     Wheezing:         Neurologic    Sudden weakness in arms or legs:     Sudden numbness in arms or legs:     Sudden onset of difficulty speaking or slurred speech:    Temporary loss of vision in one eye:     Problems with dizziness:         Gastrointestinal    Blood in stool:  Vomited  blood:         Genitourinary    Burning when urinating:     Blood in urine:        Psychiatric    Major depression:         Hematologic    Bleeding problems:    Problems with blood clotting too easily:        Skin    Rashes or ulcers:        Constitutional    Fever or chills:     PHYSICAL EXAM:   There were no vitals filed for this visit.  GENERAL: The patient is a well-nourished female, in no acute distress. The vital signs are documented above. CARDIAC: There is a regular rate and rhythm.  VASCULAR: Palpable radial pulses PULMONARY: Nonlabored respirations MUSCULOSKELETAL: There are no major deformities or cyanosis. NEUROLOGIC: No focal weakness or paresthesias are detected. SKIN: There are no ulcers or rashes noted. PSYCHIATRIC: The patient has a normal affect.  STUDIES:   I have reviewed the following: Carotid Duplex: Right Carotid: Velocities in the right ICA are consistent with a 1-39%  stenosis.   Left Carotid: Velocities in the left ICA are consistent with a 1-39%  stenosis.   Vertebrals: Bilateral vertebral arteries demonstrate antegrade flow. Left        vertebral has an atypical waveform.  Subclavians: Left subclavian artery flow was disturbed. Normal flow  hemodynamics        were seen in the right subclavian artery.  LABS(03-30-2019): LDL: 137 HDL:  55 Cr:  0.88 ASSESSMENT and PLAN   No significant carotid stenosis is visualized today.  She continues to have an abnormal vertebral artery waveform however she has palpable left radial pulse and no signs of vertebrobasilar insufficiency.  No further follow-up is recommended at this time.  She will come back on a as needed basis  Hypercholesterolemia: Patient's most recent LDL was 137.  I suggested that she follow-up with her primary care physician to discuss other options to lower this number.  She has tried dietary modification.   Leia Alf, MD, FACS Vascular and Vein  Specialists of [facility] [phone] Pager [phone]

## 2019-09-07 ENCOUNTER — Other Ambulatory Visit: Payer: Self-pay

## 2019-09-07 ENCOUNTER — Other Ambulatory Visit: Payer: 59

## 2019-09-07 DIAGNOSIS — N390 Urinary tract infection, site not specified: Secondary | ICD-10-CM

## 2019-09-07 DIAGNOSIS — R319 Hematuria, unspecified: Secondary | ICD-10-CM

## 2019-09-07 DIAGNOSIS — R739 Hyperglycemia, unspecified: Secondary | ICD-10-CM

## 2019-09-07 NOTE — Addendum Note (Signed)
Addended by: Kelle Darting A on: 09/07/2019 01:26 PM   Modules accepted: Orders

## 2019-09-08 LAB — URINE CULTURE
MICRO NUMBER:: 10357211
SPECIMEN QUALITY:: ADEQUATE

## 2019-09-10 MED FILL — CLOBETASOL PROPIONATE 0.05: 0.05 | 14 days supply | Qty: 30 | Fill #2

## 2019-09-27 MED FILL — SYNTHROID 150 MCG TABLET: 150 | 90 days supply | Qty: 90 | Fill #1

## 2019-09-27 MED FILL — VALSARTAN 160 MG TABLET: 160 | 90 days supply | Qty: 90 | Fill #1

## 2019-11-16 DIAGNOSIS — G4733 Obstructive sleep apnea (adult) (pediatric): Secondary | ICD-10-CM | POA: Diagnosis not present

## 2019-12-01 ENCOUNTER — Telehealth: Payer: Self-pay | Admitting: Pulmonary Disease

## 2019-12-01 DIAGNOSIS — G4733 Obstructive sleep apnea (adult) (pediatric): Secondary | ICD-10-CM

## 2019-12-01 NOTE — Telephone Encounter (Signed)
Dr. Halford Chessman - please advise.  Magda Paganini has been made aware that she will need to print the download from [place].

## 2019-12-01 NOTE — Telephone Encounter (Signed)
Auto CPAP download 11/01/19 to 11/30/19 >> used on 29 of 30 nights with average 6 hrs 43 min.  Average AHI 0.1 with median CPAP 12 and 95 th percentile CPAP 14 cm H2O.  Air leak.   Sent order to have her mask refit.

## 2020-01-04 ENCOUNTER — Other Ambulatory Visit: Payer: Self-pay | Admitting: Family Medicine

## 2020-01-04 DIAGNOSIS — E039 Hypothyroidism, unspecified: Secondary | ICD-10-CM

## 2020-01-04 MED FILL — VALSARTAN 160 MG TABLET: 160 | 90 days supply | Qty: 90 | Fill #2

## 2020-01-04 MED FILL — SYNTHROID 150 MCG TABLET: 150 | 90 days supply | Qty: 90 | Fill #0

## 2020-01-28 ENCOUNTER — Other Ambulatory Visit: Payer: Self-pay

## 2020-01-28 ENCOUNTER — Ambulatory Visit (INDEPENDENT_AMBULATORY_CARE_PROVIDER_SITE_OTHER): Payer: 59 | Admitting: Family Medicine

## 2020-01-28 ENCOUNTER — Encounter: Payer: Self-pay | Admitting: Family Medicine

## 2020-01-28 VITALS — BP 122/84 | HR 91 | Ht 65.0 in

## 2020-01-28 DIAGNOSIS — M545 Low back pain, unspecified: Secondary | ICD-10-CM

## 2020-01-28 DIAGNOSIS — M999 Biomechanical lesion, unspecified: Secondary | ICD-10-CM | POA: Diagnosis not present

## 2020-01-28 DIAGNOSIS — M62830 Muscle spasm of back: Secondary | ICD-10-CM | POA: Insufficient documentation

## 2020-01-28 MED ORDER — TIZANIDINE HCL 4 MG PO TABS
4.0000 mg | ORAL_TABLET | Freq: Every day | ORAL | 0 refills | Status: DC
Start: 2020-01-28 — End: 2020-06-19

## 2020-01-28 MED ORDER — KETOROLAC TROMETHAMINE 60 MG/2ML IM SOLN
60.0000 mg | Freq: Once | INTRAMUSCULAR | Status: AC
  Administered 2020-01-28: 60 mg via INTRAMUSCULAR

## 2020-01-28 MED ORDER — METHYLPREDNISOLONE ACETATE 80 MG/ML IJ SUSP
80.0000 mg | Freq: Once | INTRAMUSCULAR | Status: AC
  Administered 2020-01-28: 80 mg via INTRAMUSCULAR

## 2020-01-28 MED FILL — tiZANidine HCL 4 MG TABS: 4 | 30 days supply | Qty: 30 | Fill #0

## 2020-01-28 NOTE — Assessment & Plan Note (Signed)
Lumbar muscle spasm.  Patient given Zanaflex, Toradol and Depo-Medrol given today, responded fairly well to osteopathic manipulation, worsening pain will consider imaging but hopefully will not be necessary.  Discussed icing regimen and given home exercise by athletic trainer follow-up again in 2 to 3 weeks

## 2020-01-28 NOTE — Progress Notes (Signed)
[place] [address] [place] Phone: [phone] Subjective:   I Ashley Mckee am serving as a Education administrator for Dr. Hulan Saas.  This visit occurred during the SARS-CoV-2 public health emergency.  Safety protocols were in place, including screening questions prior to the visit, additional usage of staff PPE, and extensive cleaning of exam room while observing appropriate contact time as indicated for disinfecting solutions.   I'm seeing this patient by the request  of:  Ann Held, DO  CC: Low back pain follow-up  [id number]  Ashley Mckee is a 54 y.o. female coming in with complaint of back pain.  Patient has noticed an acute pain all of a sudden.  Came out of nowhere, seems to be tighter over the last couple days.  Patient had difficulty getting out of bed today, denies any radiation down the legs, denies any fevers chills or any abnormal weight loss.  Past medical history is significant for Hodgkin's lymphoma       Past Medical History:  Diagnosis Date  . Asthma   . 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 CHF (congestive heart failure) (HCC)    Last EF was >50%   . Diaphragm dysfunction    Left Side - She thinks this resulted after her chemotherapy.    . Diaphragm paralysis    left  . Dyspnea   . Heart murmur   . 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 Hypothyroidism   . Non Hodgkin's lymphoma ([place]) 2000   Non Hodgkin's Lymphoma (Bone Marrow Transplant)  - [place]  . OSA (obstructive sleep apnea) 03/13/2016  . Sleep apnea    Uses CPAP  . Thyroid disease    Past Surgical History:  Procedure Laterality Date  . BONE MARROW TRANSPLANT    . DILATATION & CURETTAGE/HYSTEROSCOPY WITH MYOSURE N/A 06/20/2016   Procedure: DILATATION & CURETTAGE/HYSTEROSCOPY WITH MYOSURE;  Surgeon: Arvella Nigh, MD;  Location: [facility];  Service: Gynecology;  Laterality: N/A;  . HYSTEROTOMY   2014  . LYMPH NODE BIOPSY    . TUBAL LIGATION     Social History   Socioeconomic History  . Marital status: Married    Spouse name: DAVID   . Number of children: 3  . Years of education: 69  . Highest education level: Not on file  Occupational History  . 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 Smoking status: Former Smoker    Packs/day: 1.50    Years: 17.00    Pack years: 25.50    Types: Cigarettes    Quit date: 05/28/1999    Years since quitting: 20.6  . Smokeless tobacco: Never Used  Vaping Use  . Vaping Use: Never used  Substance and Sexual Activity  . Alcohol use: Yes    Comment: Rarely   . Drug use: No  . Sexual activity: Yes    Partners: Male    Birth control/protection: Post-menopausal  Other Topics Concern  . Not on file  Social History Narrative   Marital Status:  Married [name])   Children:  Daughter (3)    Pets: Dog (2) Cat (1)    Living Situation: Lives with husband and kids    Occupation: Risk manager)     Education: Environmental education officer    Tobacco Use/Exposure:  Former Smoker    Alcohol Use:  Rarely    Drug Use:  None  Diet:  Regular   Exercise:  None   Hobbies:  Walking             Social Determinants of Health   Financial Resource Strain:   . Difficulty of Paying Living Expenses: Not on file  Food Insecurity:   . Worried About Charity fundraiser in the Last Year: Not on file  . Ran Out of Food in the Last Year: Not on file  Transportation Needs:   . Lack of Transportation (Medical): Not on file  . Lack of Transportation (Non-Medical): Not on file  Physical Activity:   . Days of Exercise per Week: Not on file  . Minutes of Exercise per Session: Not on file  Stress:   . Feeling of Stress : Not on file  Social Connections:   . Frequency of Communication with Friends and Family: Not on file  . Frequency of Social Gatherings with Friends and Family: Not on file  . Attends Religious Services:  Not on file  . Active Member of Clubs or Organizations: Not on file  . Attends Archivist Meetings: Not on file  . Marital Status: Not on file   Allergies  Allergen Reactions  . Ace Inhibitors Cough   Family History  Problem Relation Age of Onset  . Hypertension Mother   . Dementia Mother   . Diabetes Father   . Hypertension Father   . Colon cancer Maternal Grandmother   . Stomach cancer Other        maternal great grandmother  . Esophageal cancer Neg Hx   . Rectal cancer Neg Hx   . Liver cancer Neg Hx     Current Outpatient Medications (Endocrine & Metabolic):  .  SYNTHROID 150 MCG tablet, TAKE 1 TABLET BY MOUTH ONCE DAILY  Current Outpatient Medications (Cardiovascular):  .  valsartan (DIOVAN) 160 MG tablet, Take 1 tablet (160 mg total) by mouth daily.  Current Outpatient Medications (Respiratory):  .  albuterol (VENTOLIN HFA) 108 (90 Base) MCG/ACT inhaler, INHALE 2 PUFFS BY MOUTH INTO LUNGS FOUR TIMES DAILY .  budesonide-formoterol (SYMBICORT) 80-4.5 MCG/ACT inhaler, Inhale 2 puffs into the lungs 2 (two) times daily.    Current Outpatient Medications (Other):  .  clobetasol cream (TEMOVATE) 3.71 %, Apply 1 application topically as needed. Marland Kitchen  tiZANidine (ZANAFLEX) 4 MG tablet, Take 1 tablet (4 mg total) by mouth at bedtime.   Reviewed prior external information including notes and imaging from  primary care provider As well as notes that were available from care everywhere and other healthcare systems.  Past medical history, social, surgical and family history all reviewed in electronic medical record.  No pertanent information unless stated regarding to the chief complaint.   Review of Systems:  No headache, visual changes, nausea, vomiting, diarrhea, constipation, dizziness, abdominal pain, skin rash, fevers, chills, night sweats, weight loss, swollen lymph nodes, , joint swelling, chest pain, shortness of breath, mood changes. POSITIVE muscle aches, body  aches  Objective  Blood pressure 122/84, pulse 91, height 5\' 5"  (1.651 m).   General: No apparent distress alert and oriented x3 mood and affect normal, dressed appropriately.  HEENT: Pupils equal, extraocular movements intact  Respiratory: Patient's speak in full sentences and does not appear short of breath  Cardiovascular: No lower extremity edema, non tender, no erythema  Neuro: Cranial nerves II through XII are intact, neurovascularly intact in all extremities with 2+ DTRs and 2+ pulses.  Gait patient is walking gingerly MSK: Patient does have  some tightness of the lumbar spine.  Patient lacks last 10 degrees of flexion, tightness with straight leg test but no radicular symptoms.  Patient has tightness with FABER test as well.   Osteopathic findings L2 flexed rotated and side bent right sacrum right on right  Impression and Recommendations:     The above documentation has been reviewed and is accurate and complete Lyndal Pulley, DO       Note: This dictation was prepared with Dragon dictation along with smaller phrase technology. Any transcriptional errors that result from this process are unintentional.

## 2020-01-28 NOTE — Assessment & Plan Note (Signed)
   Decision today to treat with OMT was based on Physical Exam  After verbal consent patient was treated with HVLA, ME, FPR techniques in  lumbar and sacral areas, all areas are chronic   Patient tolerated the procedure well with improvement in symptoms  Patient given exercises, stretches and lifestyle modifications  See medications in patient instructions if given  Patient will follow up in 4-8 weeks 

## 2020-01-28 NOTE — Patient Instructions (Signed)
Zanaflex at night See [place] on Tuesday for  Quick check in

## 2020-02-07 ENCOUNTER — Other Ambulatory Visit (HOSPITAL_COMMUNITY): Payer: Self-pay | Admitting: Obstetrics and Gynecology

## 2020-02-07 MED FILL — CLOBETASOL PROPIONATE 0.05: 0.05 | 14 days supply | Qty: 30 | Fill #0

## 2020-02-21 DIAGNOSIS — G4733 Obstructive sleep apnea (adult) (pediatric): Secondary | ICD-10-CM | POA: Diagnosis not present

## 2020-03-14 ENCOUNTER — Other Ambulatory Visit: Payer: Self-pay

## 2020-03-14 ENCOUNTER — Ambulatory Visit (INDEPENDENT_AMBULATORY_CARE_PROVIDER_SITE_OTHER): Payer: 59 | Admitting: Family Medicine

## 2020-03-14 ENCOUNTER — Ambulatory Visit (HOSPITAL_BASED_OUTPATIENT_CLINIC_OR_DEPARTMENT_OTHER)
Admission: RE | Admit: 2020-03-14 | Discharge: 2020-03-14 | Disposition: A | Discharge status: Home / Self Care | Payer: 59 | Source: Ambulatory Visit | Attending: Family Medicine | Admitting: Family Medicine

## 2020-03-14 ENCOUNTER — Encounter: Payer: Self-pay | Admitting: Family Medicine

## 2020-03-14 VITALS — BP 100/68 | HR 76 | Temp 97.6°F | Resp 18 | Ht 65.0 in | Wt 218.8 lb

## 2020-03-14 DIAGNOSIS — R1031 Right lower quadrant pain: Secondary | ICD-10-CM | POA: Insufficient documentation

## 2020-03-14 DIAGNOSIS — I700 Atherosclerosis of aorta: Secondary | ICD-10-CM | POA: Diagnosis not present

## 2020-03-14 DIAGNOSIS — Z87448 Personal history of other diseases of urinary system: Secondary | ICD-10-CM

## 2020-03-14 DIAGNOSIS — R319 Hematuria, unspecified: Secondary | ICD-10-CM | POA: Diagnosis not present

## 2020-03-14 DIAGNOSIS — R109 Unspecified abdominal pain: Secondary | ICD-10-CM | POA: Diagnosis not present

## 2020-03-14 LAB — COMPREHENSIVE METABOLIC PANEL
AG Ratio: 1.8 (calc) (ref 1.0–2.5)
ALT: 19 U/L (ref 6–29)
AST: 19 U/L (ref 10–35)
Albumin: 4.3 g/dL (ref 3.6–5.1)
Alkaline phosphatase (APISO): 76 U/L (ref 37–153)
BUN: 15 mg/dL (ref 7–25)
CO2: 31 mmol/L (ref 20–32)
Calcium: 9.2 mg/dL (ref 8.6–10.4)
Chloride: 104 mmol/L (ref 98–110)
Creat: 0.83 mg/dL (ref 0.50–1.05)
Globulin: 2.4 g/dL (calc) (ref 1.9–3.7)
Glucose, Bld: 91 mg/dL (ref 65–99)
Potassium: 4.9 mmol/L (ref 3.5–5.3)
Sodium: 140 mmol/L (ref 135–146)
Total Bilirubin: 0.6 mg/dL (ref 0.2–1.2)
Total Protein: 6.7 g/dL (ref 6.1–8.1)

## 2020-03-14 LAB — CBC WITH DIFFERENTIAL/PLATELET
Absolute Monocytes: 611 cells/uL (ref 200–950)
Basophils Absolute: 50 cells/uL (ref 0–200)
Basophils Relative: 0.8 %
Eosinophils Absolute: 164 cells/uL (ref 15–500)
Eosinophils Relative: 2.6 %
HCT: 39.6 % (ref 35.0–45.0)
Hemoglobin: 13.3 g/dL (ref 11.7–15.5)
Lymphs Abs: 1462 cells/uL (ref 850–3900)
MCH: 31.3 pg (ref 27.0–33.0)
MCHC: 33.6 g/dL (ref 32.0–36.0)
MCV: 93.2 fL (ref 80.0–100.0)
MPV: 10.9 fL (ref 7.5–12.5)
Monocytes Relative: 9.7 %
Neutro Abs: 4013 cells/uL (ref 1500–7800)
Neutrophils Relative %: 63.7 %
Platelets: 225 10*3/uL (ref 140–400)
RBC: 4.25 10*6/uL (ref 3.80–5.10)
RDW: 13.2 % (ref 11.0–15.0)
Total Lymphocyte: 23.2 %
WBC: 6.3 10*3/uL (ref 3.8–10.8)

## 2020-03-14 LAB — POC URINALSYSI DIPSTICK (AUTOMATED)
Bilirubin, UA: NEGATIVE
Blood, UA: NEGATIVE
Glucose, UA: NEGATIVE
Ketones, UA: NEGATIVE
Leukocytes, UA: NEGATIVE
Nitrite, UA: NEGATIVE
Protein, UA: NEGATIVE
Spec Grav, UA: <=1.005 — AB (ref 1.010–1.025)
Urobilinogen, UA: 0.2 E.U./dL
pH, UA: 6.5 (ref 5.0–8.0)

## 2020-03-14 NOTE — Patient Instructions (Signed)
Flank Pain, Adult Flank pain is pain that is located on the side of the body between the upper abdomen and the back. This area is called the flank. The pain may occur over a short period of time (acute), or it may be long-term or recurring (chronic). It may be mild or severe. Flank pain can be caused by many things, including:  Muscle soreness or injury.  Kidney stones or kidney disease.  Stress.  A disease of the spine (vertebral disk disease).  A lung infection (pneumonia).  Fluid around the lungs (pulmonary edema).  A skin rash caused by the chickenpox virus (shingles).  Tumors that affect the back of the abdomen.  Gallbladder disease. Follow these instructions at home:   Drink enough fluid to keep your urine clear or pale yellow.  Rest as told by your health care provider.  Take over-the-counter and prescription medicines only as told by your health care provider.  Keep a journal to track what has caused your flank pain and what has made it feel better.  Keep all follow-up visits as told by your health care provider. This is important. Contact a health care provider if:  Your pain is not controlled with medicine.  You have new symptoms.  Your pain gets worse.  You have a fever.  Your symptoms last longer than 2-3 days.  You have trouble urinating or you are urinating very frequently. Get help right away if:  You have trouble breathing or you are short of breath.  Your abdomen hurts or it is swollen or red.  You have nausea or vomiting.  You feel faint or you pass out.  You have blood in your urine. Summary  Flank pain is pain that is located on the side of the body between the upper abdomen and the back.  The pain may occur over a short period of time (acute), or it may be long-term or recurring (chronic). It may be mild or severe.  Flank pain can be caused by many things.  Contact your health care provider if your symptoms get worse or they last  longer than 2-3 days. This information is not intended to replace advice given to you by your health care provider. Make sure you discuss any questions you have with your health care provider. Document Revised: 04/25/2017 Document Reviewed: 07/26/2016 Elsevier Patient Education  2020 [employer]  

## 2020-03-14 NOTE — Progress Notes (Signed)
Patient ID: Nalah Macioce, female    DOB: 01-Oct-1965  Age: 54 y.o. MRN: [id number]    Subjective:  Subjective  HPI Clorene Nerio presents for R flank pain and RLQ that cones and goes -- she has a hx of hematuria as well although not today  Symptoms started back in March and comes and goes   Review of Systems  Constitutional: Negative for appetite change, diaphoresis, fatigue and unexpected weight change.  Eyes: Negative for pain, redness and visual disturbance.  Respiratory: Negative for cough, chest tightness, shortness of breath and wheezing.   Cardiovascular: Negative for chest pain, palpitations and leg swelling.  Endocrine: Negative for cold intolerance, heat intolerance, polydipsia, polyphagia and polyuria.  Genitourinary: Negative for difficulty urinating, dysuria and frequency.  Neurological: Negative for dizziness, light-headedness, numbness and headaches.    History Past Medical History:  Diagnosis Date  . Asthma   . 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 CHF (congestive heart failure) (H[facility])    Last EF was >50%   . Diaphragm dysfunction    Left Side - She thinks this resulted after her chemotherapy.    . Diaphragm paralysis    left  . Dyspnea   . Heart murmur   . 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 Hypothyroidism   . Non Hodgkin's lymphoma ([place]) 2000   Non Hodgkin's Lymphoma (Bone Marrow Transplant)  - [place]  . OSA (obstructive sleep apnea) 03/13/2016  . Sleep apnea    Uses CPAP  . Thyroid disease     She has a past surgical history that includes Tubal ligation; Lymph node biopsy; Bone marrow transplant; Hysterotomy (2014); and Dilatation & curettage/hysteroscopy with myosure (N/A, 06/20/2016).   Her family history includes Colon cancer in her maternal grandmother; Dementia in her mother; Diabetes in her father; Hypertension in her father and mother; Stomach cancer in an other family member.She reports that she quit  smoking about 20 years ago. Her smoking use included cigarettes. She has a 25.50 pack-year smoking history. She has never used smokeless tobacco. She reports current alcohol use. She reports that she does not use drugs.  Current Outpatient Medications on File Prior to Visit  Medication Sig Dispense Refill  . albuterol (VENTOLIN HFA) 108 (90 Base) MCG/ACT inhaler INHALE 2 PUFFS BY MOUTH INTO LUNGS FOUR TIMES DAILY 8.5 g 1  . budesonide-formoterol (SYMBICORT) 80-4.5 MCG/ACT inhaler Inhale 2 puffs into the lungs 2 (two) times daily. 1 Inhaler 5  . clobetasol cream (TEMOVATE) 3.66 % Apply 1 application topically as needed.  6  . SYNTHROID 150 MCG tablet TAKE 1 TABLET BY MOUTH ONCE DAILY 90 tablet 1  . tiZANidine (ZANAFLEX) 4 MG tablet Take 1 tablet (4 mg total) by mouth at bedtime. 30 tablet 0  . valsartan (DIOVAN) 160 MG tablet Take 1 tablet (160 mg total) by mouth daily. 90 tablet 3   No current facility-administered medications on file prior to visit.     Objective:  Objective  Physical Exam Vitals and nursing note reviewed.  Constitutional:      Appearance: She is well-developed.  HENT:     Head: Normocephalic and atraumatic.  Eyes:     Conjunctiva/sclera: Conjunctivae normal.  Neck:     Thyroid: No thyromegaly.     Vascular: No carotid bruit or JVD.  Cardiovascular:     Rate and Rhythm: Normal rate and regular rhythm.     Heart sounds: Normal heart sounds. No murmur heard.   Pulmonary:  Effort: Pulmonary effort is normal. No respiratory distress.     Breath sounds: Normal breath sounds. No wheezing or rales.  Chest:     Chest wall: No tenderness.  Abdominal:     Tenderness: There is abdominal tenderness in the right lower quadrant. There is no right CVA tenderness, left CVA tenderness, guarding or rebound. Negative signs include Murphy's sign, Rovsing's sign, McBurney's sign, psoas sign and obturator sign.    Musculoskeletal:     Cervical back: Normal range of motion and  neck supple.  Neurological:     Mental Status: She is alert and oriented to person, place, and time.    BP 100/68 (BP Location: Right Arm, Patient Position: Sitting, Cuff Size: Normal)   Pulse 76   Temp 97.6 F (36.4 C) (Oral)   Resp 18   Ht 5\' 5"  (1.651 m)   Wt 218 lb 12.8 oz (99.2 kg)   SpO2 97%   BMI 36.41 kg/m  Wt Readings from Last 3 Encounters:  03/14/20 218 lb 12.8 oz (99.2 kg)  09/06/19 217 lb (98.4 kg)  08/09/19 224 lb 6.4 oz (101.8 kg)     Lab Results  Component Value Date   WBC 6.9 03/30/2019   HGB 13.7 03/30/2019   HCT 40.6 03/30/2019   PLT 240.0 03/30/2019   GLUCOSE 80 03/30/2019   CHOL 212 (H) 03/30/2019   TRIG 100.0 03/30/2019   HDL 55.50 03/30/2019   LDLCALC 137 (H) 03/30/2019   ALT 22 03/30/2019   AST 15 03/30/2019   NA 139 03/30/2019   K 4.4 03/30/2019   CL 101 03/30/2019   CREATININE 0.88 03/30/2019   BUN 13 03/30/2019   CO2 31 03/30/2019   TSH 1.11 03/30/2019   INR 1.11 03/12/2015   HGBA1C 5.4 02/18/2017    VAS US CAROTID  Result Date: 09/06/2019 Carotid Arterial Duplex Study Indications:       Carotid artery disease. Comparison Study:  04/09/16 at [facility]: Bilateral 1-39% ICA stenosis. Abnormal left                    vertebral waveform. Performing Technologist: Ralene Cork RVT  Examination Guidelines: A complete evaluation includes B-mode imaging, spectral Doppler, color Doppler, and power Doppler as needed of all accessible portions of each vessel. Bilateral testing is considered an integral part of a complete examination. Limited examinations for reoccurring indications may be performed as noted.  Right Carotid Findings: +----------+--------+--------+--------+------------------+--------+           PSV cm/sEDV cm/sStenosisPlaque DescriptionComments +----------+--------+--------+--------+------------------+--------+ CCA Prox  143     18                                          +----------+--------+--------+--------+------------------+--------+ CCA Mid   116     28                                         +----------+--------+--------+--------+------------------+--------+ CCA Distal100     32              heterogenous               +----------+--------+--------+--------+------------------+--------+ ICA Prox  98      34      1-39%   heterogenous               +----------+--------+--------+--------+------------------+--------+  ICA Mid   99      35                                         +----------+--------+--------+--------+------------------+--------+ ICA Distal91      34                                         +----------+--------+--------+--------+------------------+--------+ ECA       125     9                                          +----------+--------+--------+--------+------------------+--------+ +----------+--------+-------+----------------+-------------------+           PSV cm/sEDV cmsDescribe        Arm Pressure (mmHG) +----------+--------+-------+----------------+-------------------+ [id number]            Multiphasic, WNL                    +----------+--------+-------+----------------+-------------------+ +---------+--------+--+--------+--+---------+ VertebralPSV cm/s74EDV cm/s14Antegrade +---------+--------+--+--------+--+---------+  Left Carotid Findings: +----------+--------+--------+--------+------------------+--------+           PSV cm/sEDV cm/sStenosisPlaque DescriptionComments +----------+--------+--------+--------+------------------+--------+ CCA Prox  172     22                                         +----------+--------+--------+--------+------------------+--------+ CCA Mid   101     14                                         +----------+--------+--------+--------+------------------+--------+ CCA Distal100     19                                          +----------+--------+--------+--------+------------------+--------+ ICA Prox  92      28      1-39%                              +----------+--------+--------+--------+------------------+--------+ ICA Mid   80      28                                         +----------+--------+--------+--------+------------------+--------+ ICA Distal107     32                                         +----------+--------+--------+--------+------------------+--------+ ECA       141     0                                          +----------+--------+--------+--------+------------------+--------+ +----------+--------+--------+---------+-------------------+  PSV cm/sEDV cm/sDescribe Arm Pressure (mmHG) +----------+--------+--------+---------+-------------------+ Subclavian180             Turbulent                    +----------+--------+--------+---------+-------------------+ +---------+--------+--+--------+--+--------+ VertebralPSV cm/s66EDV cm/s10Atypical +---------+--------+--+--------+--+--------+   Summary: Right Carotid: Velocities in the right ICA are consistent with a 1-39% stenosis. Left Carotid: Velocities in the left ICA are consistent with a 1-39% stenosis. Vertebrals:  Bilateral vertebral arteries demonstrate antegrade flow. Left              vertebral has an atypical waveform. Subclavians: Left subclavian artery flow was disturbed. Normal flow hemodynamics              were seen in the right subclavian artery. *See table(s) above for measurements and observations.  Electronically signed by Harold Barban MD on 09/06/2019 at 11:28:01 AM.    Final      Assessment & Plan:  Plan  I am having Cora Daniels maintain her clobetasol cream, budesonide-formoterol, valsartan, albuterol, Synthroid, and tiZANidine.  No orders of the defined types were placed in this encounter.   Problem List Items Addressed This Visit      Unprioritized   History of hematuria   Relevant  Orders   CBC with Differential/Platelet   Comprehensive metabolic panel   Right lower quadrant abdominal pain - Primary    With hematuria ---- on and off since March Check CT -- r/o kidney stone  ua normal check labs       Relevant Orders   POCT Urinalysis Dipstick (Automated) (Completed)   CT RENAL STONE STUDY   CBC with Differential/Platelet   Comprehensive metabolic panel      Follow-up: Return if symptoms worsen or fail to improve.  Ann Held, DO

## 2020-03-14 NOTE — Assessment & Plan Note (Signed)
With hematuria ---- on and off since March Check CT -- r/o kidney stone  ua normal check labs

## 2020-03-14 NOTE — Assessment & Plan Note (Deleted)
abd pain on and off since March Check Ct abd  R/o kidney stones

## 2020-03-16 ENCOUNTER — Encounter: Payer: Self-pay | Admitting: Family Medicine

## 2020-03-16 ENCOUNTER — Other Ambulatory Visit: Payer: Self-pay

## 2020-03-16 DIAGNOSIS — M545 Low back pain, unspecified: Secondary | ICD-10-CM

## 2020-03-16 DIAGNOSIS — M25551 Pain in right hip: Secondary | ICD-10-CM

## 2020-03-16 DIAGNOSIS — G8929 Other chronic pain: Secondary | ICD-10-CM

## 2020-03-16 DIAGNOSIS — M25552 Pain in left hip: Secondary | ICD-10-CM

## 2020-03-20 ENCOUNTER — Encounter: Payer: Self-pay | Admitting: Family Medicine

## 2020-03-30 ENCOUNTER — Ambulatory Visit (INDEPENDENT_AMBULATORY_CARE_PROVIDER_SITE_OTHER): Payer: 59 | Admitting: Family Medicine

## 2020-03-30 ENCOUNTER — Encounter: Payer: Self-pay | Admitting: Family Medicine

## 2020-03-30 ENCOUNTER — Other Ambulatory Visit: Payer: Self-pay | Admitting: Family Medicine

## 2020-03-30 ENCOUNTER — Other Ambulatory Visit: Payer: Self-pay

## 2020-03-30 VITALS — BP 100/60 | HR 80 | Temp 97.1°F | Resp 18 | Ht 65.0 in | Wt 220.6 lb

## 2020-03-30 DIAGNOSIS — J4540 Moderate persistent asthma, uncomplicated: Secondary | ICD-10-CM

## 2020-03-30 DIAGNOSIS — I10 Essential (primary) hypertension: Secondary | ICD-10-CM | POA: Diagnosis not present

## 2020-03-30 DIAGNOSIS — R21 Rash and other nonspecific skin eruption: Secondary | ICD-10-CM

## 2020-03-30 DIAGNOSIS — Z0001 Encounter for general adult medical examination with abnormal findings: Secondary | ICD-10-CM | POA: Diagnosis not present

## 2020-03-30 DIAGNOSIS — E039 Hypothyroidism, unspecified: Secondary | ICD-10-CM | POA: Diagnosis not present

## 2020-03-30 DIAGNOSIS — Z0000 Encounter for general adult medical examination without abnormal findings: Secondary | ICD-10-CM

## 2020-03-30 MED ORDER — BUDESONIDE-FORMOTEROL FUMARATE 80-4.5 MCG/ACT IN AERO: 2.0000 | INHALATION_SPRAY | Freq: Two times a day (BID) | RESPIRATORY_TRACT | 3 refills | Status: DC

## 2020-03-30 MED FILL — SYMBICORT 80-4.5 MCG INH: 80-4.5 | 30 days supply | Qty: 10 | Fill #0

## 2020-03-30 NOTE — Patient Instructions (Signed)

## 2020-03-30 NOTE — Assessment & Plan Note (Signed)
Well controlled, no changes to meds. Encouraged heart healthy diet such as the DASH diet and exercise as tolerated.  con't diovan

## 2020-03-30 NOTE — Progress Notes (Signed)
Subjective:     Ashley Mckee is a 54 y.o. female and is here for a comprehensive physical exam. The patient reports no problems.  Social History   Socioeconomic History  . Marital status: Married    Spouse name: DAVID   . Number of children: 3  . Years of education: 33  . Highest education level: Not on file  Occupational History  . 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 Smoking status: Former Smoker    Packs/day: 1.50    Years: 17.00    Pack years: 25.50    Types: Cigarettes    Quit date: 05/28/1999    Years since quitting: 20.8  . Smokeless tobacco: Never Used  Vaping Use  . Vaping Use: Never used  Substance and Sexual Activity  . Alcohol use: Yes    Comment: Rarely   . Drug use: No  . Sexual activity: Yes    Partners: Male    Birth control/protection: Post-menopausal  Other Topics Concern  . Not on file  Social History Narrative   Marital Status:  Married [name])   Children:  Daughter (3)    Pets: Dog (2) Cat (1)    Living Situation: Lives with husband and kids    Occupation: Risk manager)     Education: Environmental education officer    Tobacco Use/Exposure:  Former Smoker    Alcohol Use:  Rarely    Drug Use:  None   Diet:  Regular   Exercise:  None   Hobbies:  Walking             Social Determinants of Health   Financial Resource Strain:   . Difficulty of Paying Living Expenses: Not on file  Food Insecurity:   . Worried About Charity fundraiser in the Last Year: Not on file  . Ran Out of Food in the Last Year: Not on file  Transportation Needs:   . Lack of Transportation (Medical): Not on file  . Lack of Transportation (Non-Medical): Not on file  Physical Activity:   . Days of Exercise per Week: Not on file  . Minutes of Exercise per Session: Not on file  Stress:   . Feeling of Stress : Not on file  Social Connections:   . Frequency of Communication with Friends and Family: Not on file  . Frequency of Social  Gatherings with Friends and Family: Not on file  . Attends Religious Services: Not on file  . Active Member of Clubs or Organizations: Not on file  . Attends Archivist Meetings: Not on file  . Marital Status: Not on file  Intimate Partner Violence:   . Fear of Current or Ex-Partner: Not on file  . Emotionally Abused: Not on file  . Physically Abused: Not on file  . Sexually Abused: Not on file   Health Maintenance  Topic Date Due  . MAMMOGRAM  04/11/2017  . PAP SMEAR-Modifier  01/18/2018  . COLONOSCOPY  07/26/2021  . TETANUS/TDAP  07/25/2024  . INFLUENZA VACCINE  Completed  . COVID-19 Vaccine  Completed  . Hepatitis C Screening  Completed  . HIV Screening  Completed    The following portions of the patient's history were reviewed and updated as appropriate:  She  has a past medical history of Asthma, Autologous bone marrow transplantation status ([place]) (2001), CHF (congestive heart failure) ([place]), Diaphragm dysfunction, Diaphragm paralysis, Dyspnea, Heart murmur, History of B-cell lymphoma (03/23/2015), Hypothyroidism, Non Hodgkin's  lymphoma ([place]) (2000), OSA (obstructive sleep apnea) (03/13/2016), Sleep apnea, and Thyroid disease. She does not have any pertinent problems on file. She  has a past surgical history that includes Tubal ligation; Lymph node biopsy; Bone marrow transplant; Hysterotomy (2014); and Dilatation & curettage/hysteroscopy with myosure (N/A, 06/20/2016). Her family history includes Colon cancer in her maternal grandmother; Dementia in her mother; Diabetes in her father; Hypertension in her father and mother; Stomach cancer in an other family member. She  reports that she quit smoking about 20 years ago. Her smoking use included cigarettes. She has a 25.50 pack-year smoking history. She has never used smokeless tobacco. She reports current alcohol use. She reports that she does not use drugs. She has a current medication list which includes the following  prescription(s): albuterol, budesonide-formoterol, clobetasol cream, synthroid, tizanidine, and valsartan. Current Outpatient Medications on File Prior to Visit  Medication Sig Dispense Refill  . albuterol (VENTOLIN HFA) 108 (90 Base) MCG/ACT inhaler INHALE 2 PUFFS BY MOUTH INTO LUNGS FOUR TIMES DAILY 8.5 g 1  . clobetasol cream (TEMOVATE) 2.13 % Apply 1 application topically as needed.  6  . SYNTHROID 150 MCG tablet TAKE 1 TABLET BY MOUTH ONCE DAILY 90 tablet 1  . tiZANidine (ZANAFLEX) 4 MG tablet Take 1 tablet (4 mg total) by mouth at bedtime. 30 tablet 0  . valsartan (DIOVAN) 160 MG tablet Take 1 tablet (160 mg total) by mouth daily. 90 tablet 3   No current facility-administered medications on file prior to visit.   She is allergic to ace inhibitors..  Review of Systems Review of Systems  Constitutional: Negative for activity change, appetite change and fatigue.  HENT: Negative for hearing loss, congestion, tinnitus and ear discharge.  dentist q58m Eyes: Negative for visual disturbance (see optho q1y -- vision corrected to 20/20 with glasses).  Respiratory: Negative for cough, chest tightness and shortness of breath.   Cardiovascular: Negative for chest pain, palpitations and leg swelling.  Gastrointestinal: Negative for abdominal pain, diarrhea, constipation and abdominal distention.  Genitourinary: Negative for urgency, frequency, decreased urine volume and difficulty urinating.  Musculoskeletal: Negative for back pain, arthralgias and gait problem.  Skin: Negative for color change, pallor   + rash on low back-- dry itchy patches Neurological: Negative for dizziness, light-headedness, numbness and headaches.  Hematological: Negative for adenopathy. Does not bruise/bleed easily.  Psychiatric/Behavioral: Negative for suicidal ideas, confusion, sleep disturbance, self-injury, dysphoric mood, decreased concentration and agitation.     \  Objective:    BP 100/60 (BP Location: Right  Arm, Patient Position: Sitting, Cuff Size: Large)   Pulse 80   Temp (!) 97.1 F (36.2 C) (Oral)   Resp 18   Ht 5\' 5"  (1.651 m)   Wt 220 lb 9.6 oz (100.1 kg)   SpO2 98%   BMI 36.71 kg/m  General appearance: alert, cooperative, appears stated age and no distress Head: Normocephalic, without obvious abnormality, atraumatic Eyes: negative findings: lids and lashes normal, conjunctivae and sclerae normal and pupils equal, round, reactive to light and accomodation Ears: normal TM's and external ear canals both ears Neck: no adenopathy, no carotid bruit, no JVD, supple, symmetrical, trachea midline and thyroid not enlarged, symmetric, no tenderness/mass/nodules Back: symmetric, no curvature. ROM normal. No CVA tenderness. Lungs: clear to auscultation bilaterally Breasts: gyn Heart: regular rate and rhythm, S1, S2 normal, no murmur, click, rub or gallop Abdomen: soft, non-tender; bowel sounds normal; no masses,  no organomegaly Pelvic: deferred--gyn Extremities: extremities normal, atraumatic, no cyanosis or edema Pulses:  2+ and symmetric Skin:white flaky patches and errythema on low back   Lymph nodes: Cervical, supraclavicular, and axillary nodes normal. Neurologic: Alert and oriented X 3, normal strength and tone. Normal symmetric reflexes. Normal coordination and gait    Assessment:    Healthy female exam.      Plan:     ghm utd Check labs See After Visit Summary for Counseling Recommendations    1. Moderate persistent asthma, unspecified whether complicated Using albuterol more Refill symbicort  - budesonide-formoterol (SYMBICORT) 80-4.5 MCG/ACT inhaler; Inhale 2 puffs into the lungs 2 (two) times daily.  Dispense: 3 each; Refill: 3  2. Primary hypertension Well controlled, no changes to meds. Encouraged heart healthy diet such as the DASH diet and exercise as tolerated.  con't divovan - Lipid panel  3. Hypothyroidism, unspecified type Check labs con't synthroid  -  TSH  4. Rash ? Psoriasis----- refer to derm  - Ambulatory referral to Dermatology  5. Preventative health care See above   6. Essential hypertension

## 2020-03-30 NOTE — Assessment & Plan Note (Signed)
Using albuterol more often She has not taken her symbicort Will refill  rto if no improvement

## 2020-03-30 NOTE — Assessment & Plan Note (Signed)
Check labs con't synthroid 

## 2020-03-31 LAB — LIPID PANEL
Cholesterol: 219 mg/dL — ABNORMAL HIGH (ref <200)
HDL: 56 mg/dL (ref >=50)
LDL Cholesterol (Calc): 143 mg/dL (calc) — ABNORMAL HIGH
Non-HDL Cholesterol (Calc): 163 mg/dL (calc) — ABNORMAL HIGH (ref <130)
Total CHOL/HDL Ratio: 3.9 (calc) (ref <5.0)
Triglycerides: 93 mg/dL (ref <150)

## 2020-03-31 LAB — TSH: TSH: 0.54 mIU/L

## 2020-04-10 ENCOUNTER — Other Ambulatory Visit: Payer: Self-pay | Admitting: Family Medicine

## 2020-04-10 DIAGNOSIS — I10 Essential (primary) hypertension: Secondary | ICD-10-CM

## 2020-04-10 MED FILL — SYNTHROID 150 MCG TABLET: 150 | 90 days supply | Qty: 90 | Fill #1

## 2020-04-10 MED FILL — VALSARTAN 160 MG TABLET: 160 | 90 days supply | Qty: 90 | Fill #0

## 2020-05-01 DIAGNOSIS — L900 Lichen sclerosus et atrophicus: Secondary | ICD-10-CM | POA: Diagnosis not present

## 2020-05-01 DIAGNOSIS — D485 Neoplasm of uncertain behavior of skin: Secondary | ICD-10-CM | POA: Diagnosis not present

## 2020-05-10 ENCOUNTER — Ambulatory Visit: Payer: 59 | Admitting: Pulmonary Disease

## 2020-05-11 ENCOUNTER — Telehealth: Payer: 59 | Admitting: Emergency Medicine

## 2020-05-11 DIAGNOSIS — J069 Acute upper respiratory infection, unspecified: Secondary | ICD-10-CM | POA: Diagnosis not present

## 2020-05-11 MED ORDER — BENZONATATE 100 MG PO CAPS: 100.0000 mg | ORAL_CAPSULE | Freq: Two times a day (BID) | ORAL | 0 refills | Status: DC | PRN

## 2020-05-11 MED ORDER — FLUTICASONE PROPIONATE 50 MCG/ACT NA SUSP: 2.0000 | Freq: Every day | NASAL | 0 refills | Status: DC

## 2020-05-11 NOTE — Progress Notes (Signed)
We are sorry you are not feeling well.  Here is how we plan to help!  Based on what you have shared with me, it looks like you may have a viral upper respiratory infection.  Upper respiratory infections are caused by a large number of viruses; however, rhinovirus is the most common cause.   Symptoms vary from person to person, with common symptoms including sore throat, cough, fatigue or lack of energy and feeling of general discomfort.  A low-grade fever of up to 100.4 may present, but is often uncommon.  Symptoms vary however, and are closely related to a person's age or underlying illnesses.  The most common symptoms associated with an upper respiratory infection are nasal discharge or congestion, cough, sneezing, headache and pressure in the ears and face.  These symptoms usually persist for about 3 to 10 days, but can last up to 2 weeks.  It is important to know that upper respiratory infections do not cause serious illness or complications in most cases.    Upper respiratory infections can be transmitted from person to person, with the most common method of transmission being a person's hands.  The virus is able to live on the skin and can infect other persons for up to 2 hours after direct contact.  Also, these can be transmitted when someone coughs or sneezes; thus, it is important to cover the mouth to reduce this risk.  To keep the spread of the illness at bay, good hand hygiene is very important.  This is an infection that is most likely caused by a virus. There are no specific treatments other than to help you with the symptoms until the infection runs its course.  We are sorry you are not feeling well.  Here is how we plan to help!   For nasal congestion, you may use an oral decongestants such as Mucinex D or if you have glaucoma or high blood pressure use plain Mucinex.  Saline nasal spray or nasal drops can help and can safely be used as often as needed for congestion.  For your congestion,  I have prescribed Fluticasone nasal spray one spray in each nostril twice a day  If you do not have a history of heart disease, hypertension, diabetes or thyroid disease, prostate/bladder issues or glaucoma, you may also use Sudafed to treat nasal congestion.  It is highly recommended that you consult with a pharmacist or your primary care physician to ensure this medication is safe for you to take.     If you have a cough, you may use cough suppressants such as Delsym and Robitussin.  If you have glaucoma or high blood pressure, you can also use Coricidin HBP.   For cough I have prescribed for you A prescription cough medication called Tessalon Perles 100 mg. You may take 1-2 capsules every 8 hours as needed for cough  If you have a sore or scratchy throat, use a saltwater gargle-  to  teaspoon of salt dissolved in a 4-ounce to 8-ounce glass of warm water.  Gargle the solution for approximately 15-30 seconds and then spit.  It is important not to swallow the solution.  You can also use throat lozenges/cough drops and Chloraseptic spray to help with throat pain or discomfort.  Warm or cold liquids can also be helpful in relieving throat pain.  For headache, pain or general discomfort, you can use Ibuprofen or Tylenol as directed.   Some authorities believe that zinc sprays or the use of   Echinacea may shorten the course of your symptoms.  For your ear pain/dizziness/nausea, this is likely congestion behind your ear drum.  I recommend taking an antihistamine such as Zyrtec or Claritin.  If your symptoms are severe you could try OTC dramamine or meclizine.   HOME CARE . Only take medications as instructed by your medical team. . Be sure to drink plenty of fluids. Water is fine as well as fruit juices, sodas and electrolyte beverages. You may want to stay away from caffeine or alcohol. If you are nauseated, try taking small sips of liquids. How do you know if you are getting enough fluid? Your urine  should be a pale yellow or almost colorless. . Get rest. . Taking a steamy shower or using a humidifier may help nasal congestion and ease sore throat pain. You can place a towel over your head and breathe in the steam from hot water coming from a faucet. . Using a saline nasal spray works much the same way. . Cough drops, hard candies and sore throat lozenges may ease your cough. . Avoid close contacts especially the very young and the elderly . Cover your mouth if you cough or sneeze . Always remember to wash your hands.   GET HELP RIGHT AWAY IF: . You develop worsening fever. . If your symptoms do not improve within 10 days . You develop yellow or green discharge from your nose over 3 days. . You have coughing fits . You develop a severe head ache or visual changes. . You develop shortness of breath, difficulty breathing or start having chest pain . Your symptoms persist after you have completed your treatment plan  MAKE SURE YOU   Understand these instructions.  Will watch your condition.  Will get help right away if you are not doing well or get worse.  Your e-visit answers were reviewed by a board certified advanced clinical practitioner to complete your personal care plan. Depending upon the condition, your plan could have included both over the counter or prescription medications. Please review your pharmacy choice. If there is a problem, you may call our nursing hot line at and have the prescription routed to another pharmacy. Your safety is important to [place]. If you have drug allergies check your prescription carefully.   You can use MyChart to ask questions about today's visit, request a non-urgent call back, or ask for a work or school excuse for 24 hours related to this e-Visit. If it has been greater than 24 hours you will need to follow up with your provider, or enter a new e-Visit to address those concerns. You will get an e-mail in the next two days asking about your  experience.  I hope that your e-visit has been valuable and will speed your recovery. Thank you for using e-visits.     Approximately 5 minutes was used in reviewing the patient's chart, questionnaire, prescribing medications, and documentation.

## 2020-05-18 DIAGNOSIS — G4733 Obstructive sleep apnea (adult) (pediatric): Secondary | ICD-10-CM | POA: Diagnosis not present

## 2020-05-26 MED FILL — SYMBICORT 80-4.5 MCG INH: 80-4.5 | 30 days supply | Qty: 10 | Fill #1

## 2020-06-01 ENCOUNTER — Other Ambulatory Visit (HOSPITAL_COMMUNITY): Payer: Self-pay | Admitting: Obstetrics and Gynecology

## 2020-06-01 DIAGNOSIS — N762 Acute vulvitis: Secondary | ICD-10-CM | POA: Diagnosis not present

## 2020-06-01 DIAGNOSIS — Z6837 Body mass index (BMI) 37.0-37.9, adult: Secondary | ICD-10-CM | POA: Diagnosis not present

## 2020-06-01 DIAGNOSIS — Z1231 Encounter for screening mammogram for malignant neoplasm of breast: Secondary | ICD-10-CM | POA: Diagnosis not present

## 2020-06-01 DIAGNOSIS — B373 Candidiasis of vulva and vagina: Secondary | ICD-10-CM | POA: Diagnosis not present

## 2020-06-01 DIAGNOSIS — Z01419 Encounter for gynecological examination (general) (routine) without abnormal findings: Secondary | ICD-10-CM | POA: Diagnosis not present

## 2020-06-01 MED FILL — CLOTRIMAZOLE-BETAMETHASONE: 1-0.05 | 5 days supply | Qty: 15 | Fill #0

## 2020-06-19 ENCOUNTER — Ambulatory Visit (INDEPENDENT_AMBULATORY_CARE_PROVIDER_SITE_OTHER): Payer: 59 | Admitting: Pulmonary Disease

## 2020-06-19 ENCOUNTER — Encounter: Payer: Self-pay | Admitting: Pulmonary Disease

## 2020-06-19 ENCOUNTER — Other Ambulatory Visit: Payer: Self-pay

## 2020-06-19 VITALS — BP 114/70 | HR 85 | Temp 97.2°F | Ht 65.0 in | Wt 226.0 lb

## 2020-06-19 DIAGNOSIS — J4520 Mild intermittent asthma, uncomplicated: Secondary | ICD-10-CM | POA: Diagnosis not present

## 2020-06-19 DIAGNOSIS — K9041 Non-celiac gluten sensitivity: Secondary | ICD-10-CM

## 2020-06-19 DIAGNOSIS — G4733 Obstructive sleep apnea (adult) (pediatric): Secondary | ICD-10-CM | POA: Diagnosis not present

## 2020-06-19 DIAGNOSIS — Z9989 Dependence on other enabling machines and devices: Secondary | ICD-10-CM

## 2020-06-19 NOTE — Progress Notes (Signed)
Pulmonary, Critical Care, and Sleep Medicine  Chief Complaint  Patient presents with  . 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  Brief Summary:  Ashley Mckee is a 55 y.o. female with obstructive sleep apnea on CPAP.       Subjective:   Patient reports feeling. She has been compliant with her CPAP without any issues.   She does endorse some shortness of breath which she attributes to her diet. She states she has worsening SOB when she eats breads or foods with flour. She endorses bloating with this. She denies any associated symptoms such as n/v/d or rashes. She has been using her Symbicort inhaler more frequently due to this. Discussed this may be related a dietary intolerance coupled with a paralytic diaphragm making it difficult for her to breathe. She plans to inform us if this persists after dietary modifications.   She is planning on moving to [place] and plans to establish care there.   Physical Exam:   Appearance - well kempt   ENMT - no sinus tenderness, no oral exudate, no stridor  Respiratory - equal breath sounds bilaterally, no wheezing or rales  CV - s1s2 regular rate and rhythm, no murmurs  Ext - no clubbing, no edema  Skin - no rashes  Psych - normal mood and affect   Pulmonary testing:  PFT 06/02/15 >> FEV1 1.33 (43%), FEV1% 74, TLC 3.24 (60%), DLCO 71%,  +BD  Chest Imaging:  CT angio chest 03/20/15 >> post XRT changes LUL/LLL and RUL, calcified granulomas RUL/LUL/LLL  Sleep Tests:  HST 03/12/16 >> AHI 33.7, SaO2 low 77% Auto CPAP11/14/20 to 05/09/19 >> used on 30 of 30 nights with average 6 hrs 40 min.  Average AHI 0.2 with median CPAP 12 and 95 th percentile CPAP 15 cm H2O.  Cardiac Tests:  Echo11/11/20 >> EF 50 to 55%  Social History:  She  reports that she quit smoking about 21 years ago. Her smoking use included cigarettes. She has a 25.50 pack-year smoking history. She has never used smokeless tobacco. She reports current alcohol use. She reports that she does not use drugs.  Family History:  Her family history includes Colon cancer in her maternal grandmother; Dementia in her mother; Diabetes in her father; Hypertension in her father and mother; Stomach cancer in an other family member.    Discussion:  Discussed with patient to make dietary modifications/avoid the foods that she attributes to her SOB. Informed her to notify us if the shortness of breath persists despite dietary changes.   Of note, patient is moving to [place], [place] and plans to establish care there.  Assessment/Plan:  Obstructive sleep apnea. - compliant with CPAP  - continue auto CPAP 8-17 cm H2O  Shortness of breath. 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  - working on weight loss   Follow up:   Patient Instructions  Follow up in 1 year   Medication List:   Allergies as of 06/19/2020      Reactions   Ace Inhibitors Cough      Medication List       Accurate as of June 19, 2020  3:56 PM. If you have any questions, ask your nurse or doctor.        STOP taking these medications   benzonatate 100 MG capsule Commonly known as: TESSALON Stopped by:  Chesley Mires, MD   fluticasone 50 MCG/ACT nasal spray Commonly known as: FLONASE Stopped by: Chesley Mires, MD   tiZANidine 4 MG tablet Commonly known as: Zanaflex Stopped by: Chesley Mires, MD     TAKE these medications   albuterol 108 (90 Base) MCG/ACT inhaler Commonly known as: VENTOLIN HFA INHALE 2 PUFFS BY MOUTH INTO LUNGS FOUR TIMES DAILY   budesonide-formoterol 80-4.5 MCG/ACT inhaler Commonly known as: SYMBICORT Inhale 2 puffs into the lungs 2 (two) times daily.   clobetasol cream 0.05 % Commonly known as: TEMOVATE Apply 1 application topically as needed.   Synthroid 150 MCG tablet Generic drug: levothyroxine TAKE 1 TABLET BY MOUTH ONCE DAILY   valsartan 160 MG tablet Commonly known as: DIOVAN Take 1 tablet (160 mg total) by mouth daily.       Signature:   Harlow Ohms, DO PGY-2 IM  [place] Pager - [phone] 06/19/2020, 3:56 PM  Attending portion:   Uses CPAP nightly.  No issues with mask fit.  Feels pressure setting is good.  Has been using symbicort intermittently.  Seems to need more often when she has sinus congestion or if she eats too many carbs.  Denies chest pain, sputum, or leg swelling.   Appearance - well kempt   ENMT - no sinus tenderness, no oral exudate, no LAN, Mallampati 3 airway, no stridor  Respiratory - equal breath sounds bilaterally, no wheezing or rales  CV - s1s2 regular rate and rhythm, no murmurs  Ext - no clubbing, no edema  Skin - no rashes  Psych - normal mood and affect  Assessment/plan:  Obstructive sleep apnea. - she is compliant with therapy and reports benefit from CPAP - uses [employer] for her DME - continue auto CPAP 8 to 17 cm H2O  Mild, intermittent asthma with allergic rhinitis. - uncertain whether she could have mild gluten intolerance  - continue symbicort prn - if symptoms progress, would then need additional testing  Time spent by me: 24 minutes  Follow up in 1  year  Chesley Mires, MD [place] Pager - [phone] 06/19/2020, 4:27 PM

## 2020-06-19 NOTE — Patient Instructions (Signed)
Follow up in 1 year.

## 2020-06-26 MED FILL — CLOBETASOL PROPIONATE 0.05: 0.05 | 14 days supply | Qty: 30 | Fill #1

## 2020-07-13 ENCOUNTER — Other Ambulatory Visit: Payer: Self-pay | Admitting: Family Medicine

## 2020-07-13 DIAGNOSIS — E039 Hypothyroidism, unspecified: Secondary | ICD-10-CM

## 2020-07-13 MED FILL — VALSARTAN 160 MG TABS: 160 | 90 days supply | Qty: 90 | Fill #1

## 2020-07-13 MED FILL — SYNTHROID 150 MCG TABLET: 150 | 90 days supply | Qty: 90 | Fill #0

## 2020-07-17 ENCOUNTER — Other Ambulatory Visit: Payer: Self-pay

## 2020-07-17 ENCOUNTER — Other Ambulatory Visit: Payer: Self-pay | Admitting: Family

## 2020-07-17 ENCOUNTER — Ambulatory Visit: Payer: 59 | Admitting: Family

## 2020-07-17 DIAGNOSIS — N390 Urinary tract infection, site not specified: Secondary | ICD-10-CM

## 2020-07-17 MED ORDER — SULFAMETHOXAZOLE-TRIMETHOPRIM 800-160 MG PO TABS: 1.0000 | ORAL_TABLET | Freq: Two times a day (BID) | ORAL | 0 refills | Status: DC

## 2020-07-17 MED FILL — SULFAMETHOXAZOLE-TMP DS TAB: 800-160 | 5 days supply | Qty: 10 | Fill #0

## 2020-07-17 NOTE — Progress Notes (Signed)
Patient was seen as Orthoptist;  Symptoms of UTI x 1 days; started yesterday with burning at end of urinary stream, discolored urine and "strong smelling urine." Requesting treatment for UTI as she is unable to see her PCP today; has had UTIs in the past and symptoms are consistent;   Will check urine culture and start Bactrim DS bid x 5 days; increase fluids, rest and follow-up with her PCP if symptoms persist.

## 2020-08-02 MED FILL — SYMBICORT 80-4.5 MCG INH: 80-4.5 | 30 days supply | Qty: 10 | Fill #2

## 2020-08-17 ENCOUNTER — Other Ambulatory Visit: Payer: Self-pay | Admitting: Family Medicine

## 2020-08-17 MED FILL — ALBUTEROL SULFATE HFA 108 (: 108 (90 BAS | 25 days supply | Qty: 18 | Fill #0

## 2020-08-18 ENCOUNTER — Other Ambulatory Visit (HOSPITAL_BASED_OUTPATIENT_CLINIC_OR_DEPARTMENT_OTHER): Payer: Self-pay

## 2020-08-22 DIAGNOSIS — G4733 Obstructive sleep apnea (adult) (pediatric): Secondary | ICD-10-CM | POA: Diagnosis not present

## 2020-09-07 ENCOUNTER — Other Ambulatory Visit (HOSPITAL_COMMUNITY): Payer: Self-pay

## 2020-09-07 MED FILL — Clotrimazole w/ Betamethasone Cream 1-0.05%: CUTANEOUS | 14 days supply | Qty: 15 | Fill #0 | Status: CN

## 2020-09-15 ENCOUNTER — Other Ambulatory Visit (HOSPITAL_COMMUNITY): Payer: Self-pay

## 2020-09-21 ENCOUNTER — Other Ambulatory Visit (HOSPITAL_COMMUNITY): Payer: Self-pay

## 2020-09-21 ENCOUNTER — Other Ambulatory Visit: Payer: Self-pay | Admitting: Family Medicine

## 2020-09-21 DIAGNOSIS — I10 Essential (primary) hypertension: Secondary | ICD-10-CM

## 2020-09-21 MED ORDER — VALSARTAN 160 MG PO TABS
ORAL_TABLET | Freq: Every day | ORAL | 1 refills | Status: AC
  Filled 2020-09-21: qty 90, 90d supply, fill #0

## 2020-09-21 MED FILL — Clotrimazole w/ Betamethasone Cream 1-0.05%: CUTANEOUS | 15 days supply | Qty: 15 | Fill #0 | Status: AC

## 2020-09-21 MED FILL — Levothyroxine Sodium Tab 150 MCG: ORAL | 90 days supply | Qty: 90 | Fill #0 | Status: AC

## 2020-09-23 ENCOUNTER — Other Ambulatory Visit (HOSPITAL_COMMUNITY): Payer: Self-pay

## 2021-10-04 ENCOUNTER — Encounter: Payer: Self-pay | Admitting: Gastroenterology

## 2022-04-03 IMAGING — CT CT RENAL STONE PROTOCOL
2 of 4 series · 17 of 46 positions shown, 19 images · non-contrast
Comparison: None

CLINICAL DATA: BILATERAL flank pain for 3 weeks RIGHT greater than
LEFT, hematuria, question kidney stones

EXAM:
CT ABDOMEN AND PELVIS WITHOUT CONTRAST
TECHNIQUE: Multidetector CT imaging of the abdomen and pelvis was performed
following the standard protocol without IV contrast. Sagittal and
coronal MPR images reconstructed from axial data set. No oral
contrast administered.

[Series 2: axial st · axial · 0.87mm/px · z∈[-524,-79]mm · 14 of 97 slices shown, 16 images]
[im 4/97  soft-tissue]
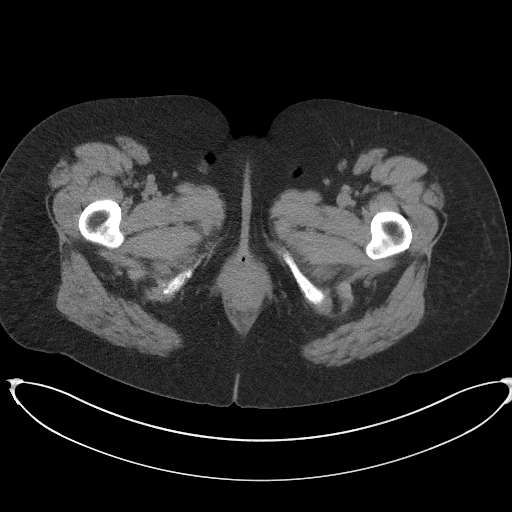
[im 4/97  bone]
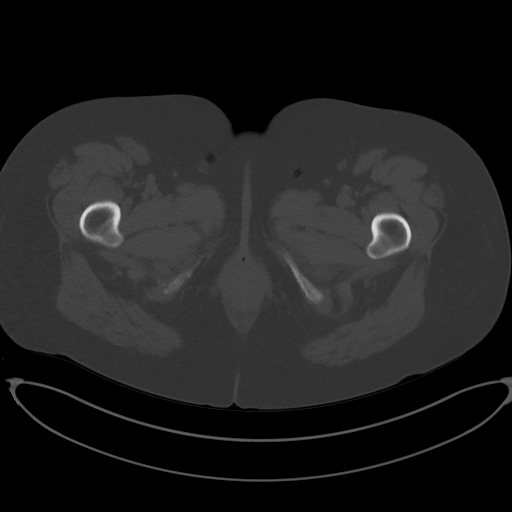
[im 12/97  soft-tissue]
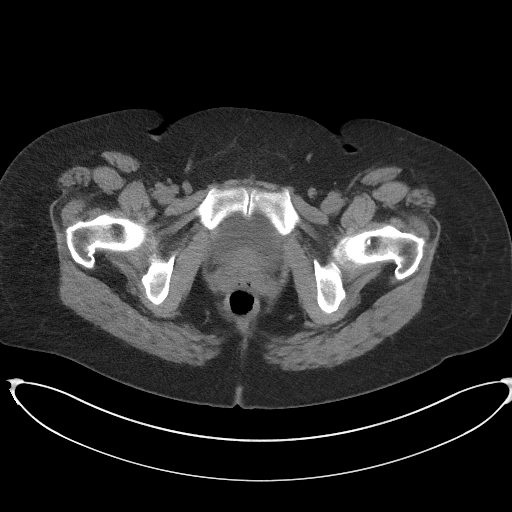
[im 20/97  soft-tissue]
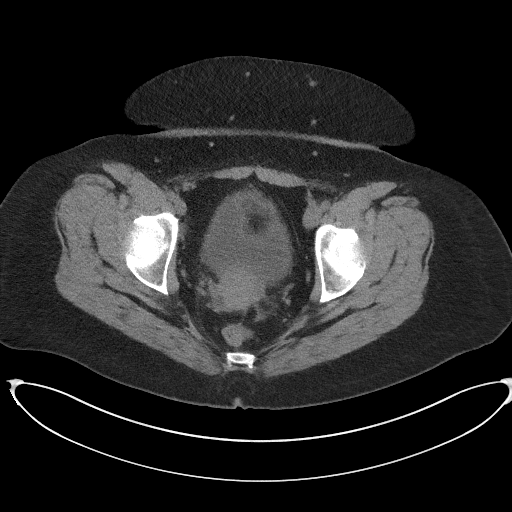
[im 27/97  soft-tissue]
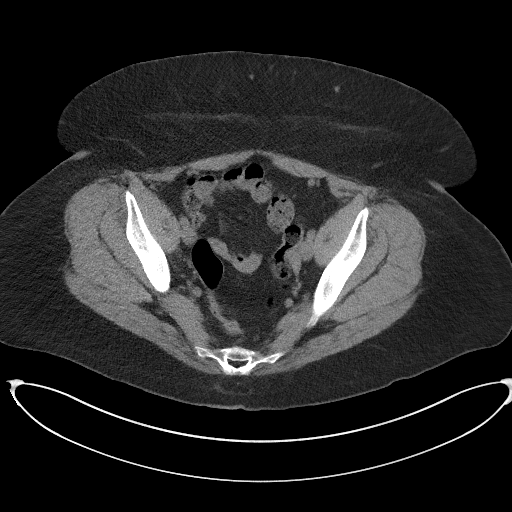
[im 31/97  soft-tissue]
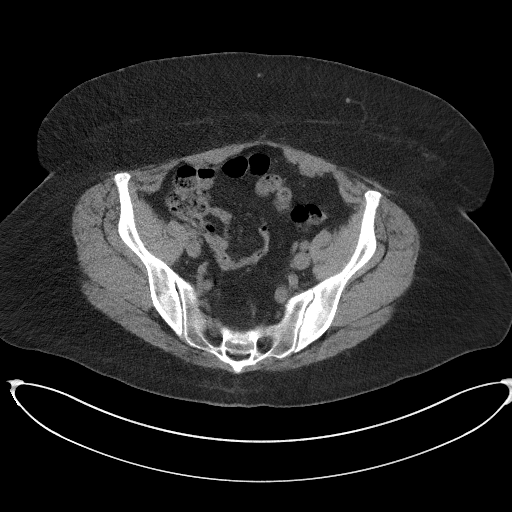
[im 39/97  soft-tissue]
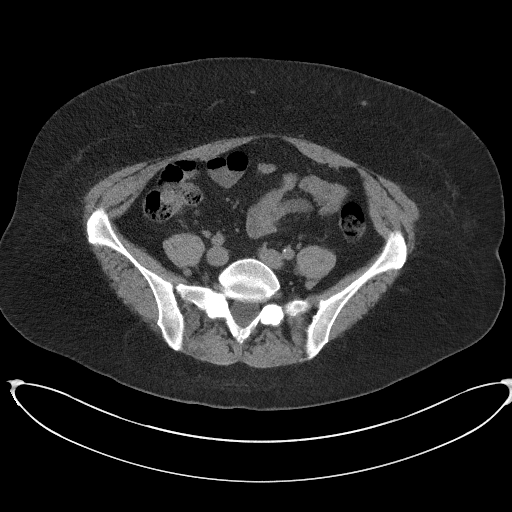
[im 47/97  soft-tissue]
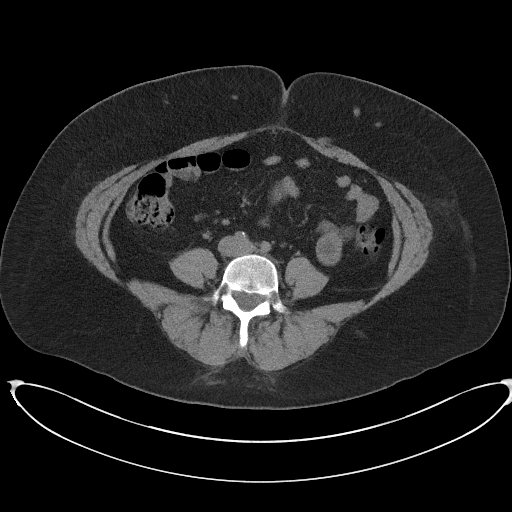
[im 50/97  soft-tissue]
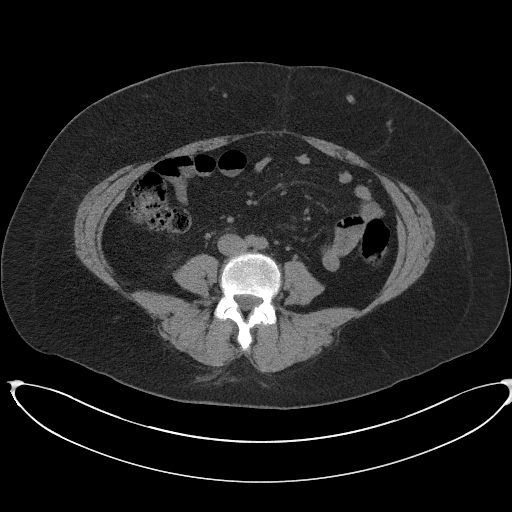
[im 58/97  soft-tissue]
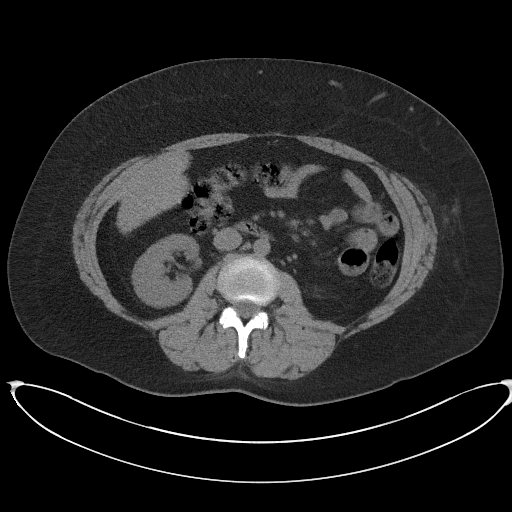
[im 58/97  bone]
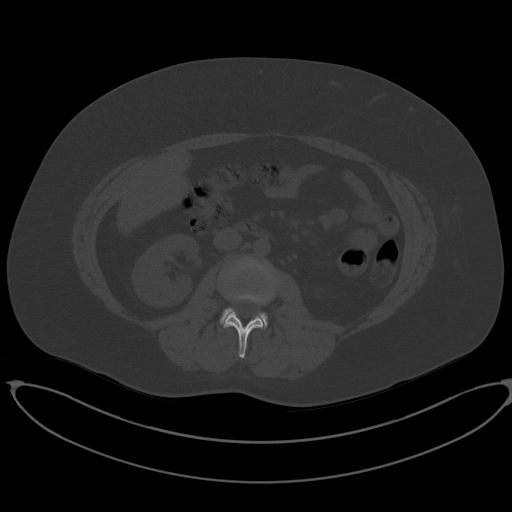
[im 66/97  soft-tissue]
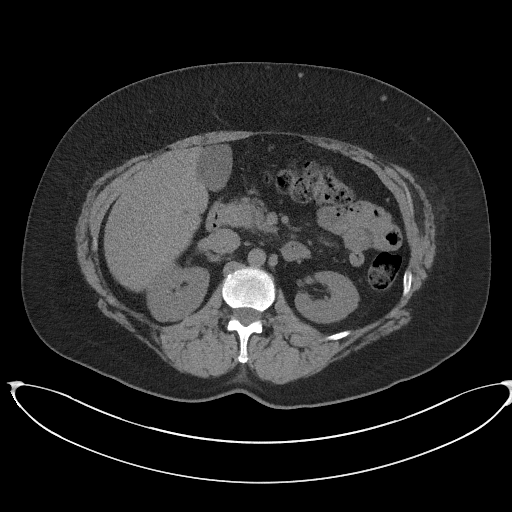
[im 73/97  soft-tissue]
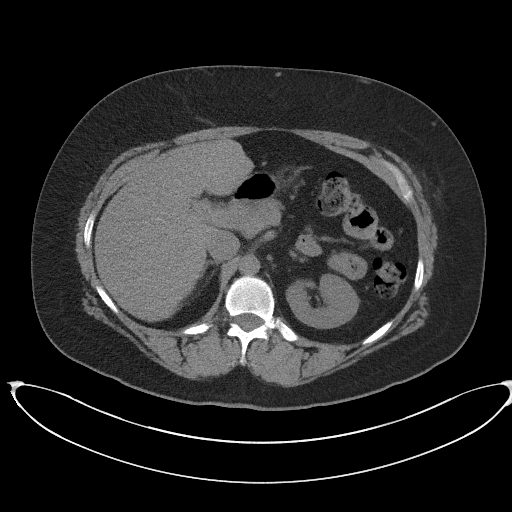
[im 77/97  soft-tissue]
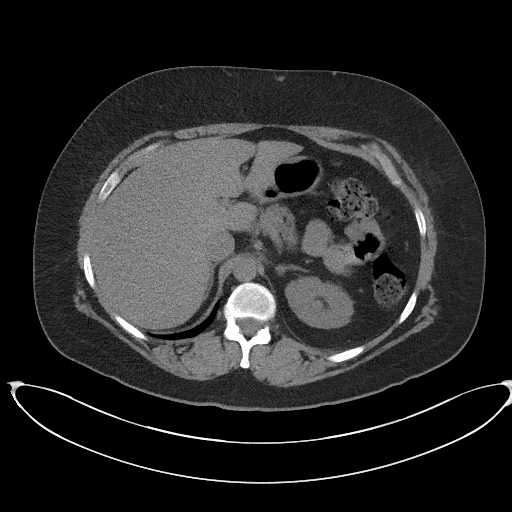
[im 85/97  soft-tissue]
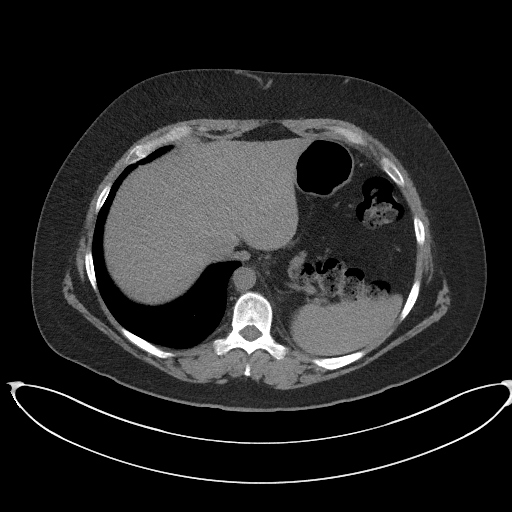
[im 93/97  soft-tissue]
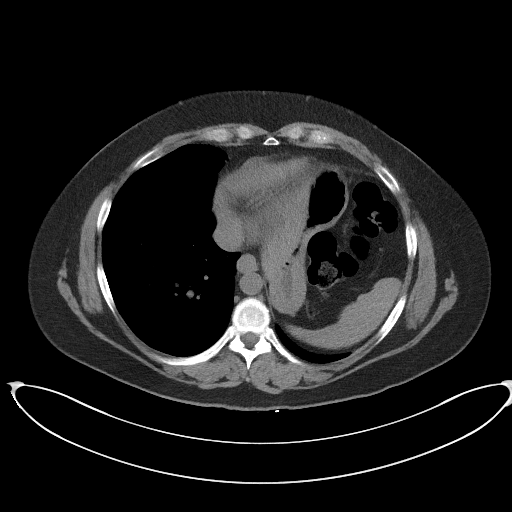

[Series 4: coronal st · coronal · 0.89mm/px · 3 of 109 slices shown]
[im 37/109  soft-tissue]
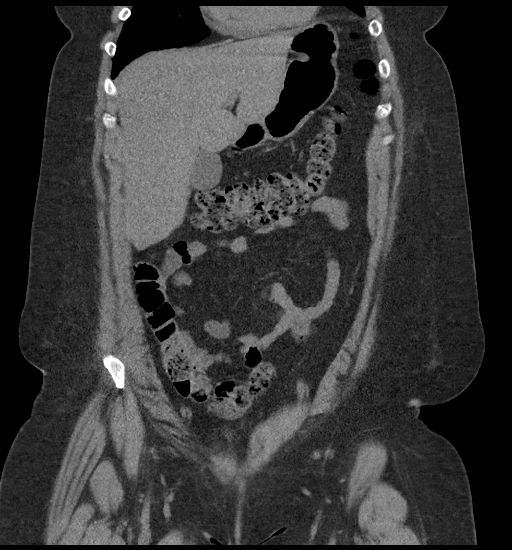
[im 49/109  soft-tissue]
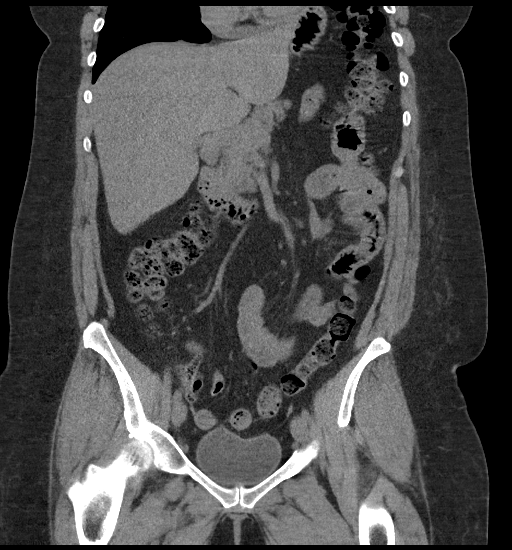
[im 61/109  soft-tissue]
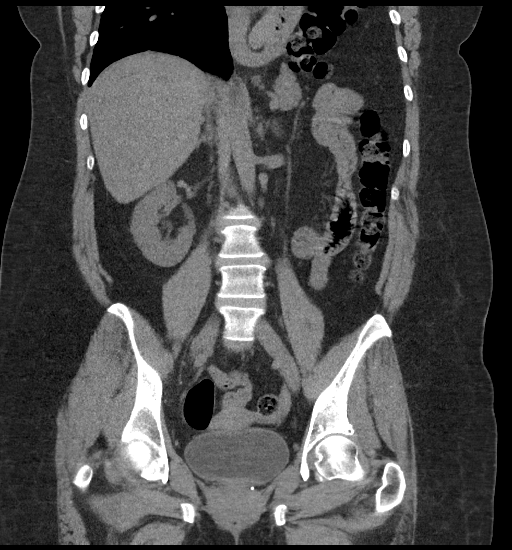

[17 of 46 positions shown; findings below may reference images not displayed]

FINDINGS: Lower chest: Eventration LEFT diaphragm. Lung bases otherwise clear.

Hepatobiliary: Gallbladder and liver normal appearance

Pancreas: Normal appearance

Spleen: Normal appearance

Adrenals/Urinary Tract: Adrenal glands, kidneys, ureters, and
bladder normal appearance. No urinary tract calcification or
dilatation.

Stomach/Bowel: Normal appendix coiled adjacent to cecal tip. Stomach
and bowel loops normal appearance.

Vascular/Lymphatic: Aorta normal caliber with mild atherosclerotic
calcification. No adenopathy.

Reproductive: Unremarkable uterus and adnexa

Other: No free air or free fluid.  No inflammatory process.

Musculoskeletal: Osseous structures unremarkable.
IMPRESSION: No acute intra-abdominal or intrapelvic abnormalities.

Specifically, no urinary tract calcification or dilatation.

Aortic Atherosclerosis ([id number]).
